# Patient Record
Sex: Female | Born: 1980
Health system: Southern US, Community
[De-identification: ages and names within clinical notes are randomized; demographics above are authoritative.]

## PROBLEM LIST (undated history)

## (undated) ENCOUNTER — Inpatient Hospital Stay (HOSPITAL_COMMUNITY): Payer: Self-pay

## (undated) DIAGNOSIS — I10 Essential (primary) hypertension: Secondary | ICD-10-CM

## (undated) DIAGNOSIS — D573 Sickle-cell trait: Secondary | ICD-10-CM

## (undated) HISTORY — DX: Sickle-cell trait: D57.3

---

## 2012-09-14 NOTE — L&D Delivery Note (Signed)
I was present for the entire delivery and agree with resident's note and plan of care. Pt was a difficult repair 2/2 inability to stay in same position from pain. Repaired in best fashion possible with patient minimal cooperation. Tawana Scale, MD OB Fellow 06/05/2013 12:55 AM

## 2012-09-14 NOTE — L&D Delivery Note (Signed)
Delivery Note At 8:26 PM a viable female was delivered via  (Presentation: Occiput Anterior).  APGAR: 9, 9; weight TBD.   Placenta status: Intact, Spontaneous.  Cord:  3v with the following complications: 2nd deg laceration.  Cord pH: not sent  Anesthesia:  none Episiotomy: none Lacerations: 2nd degree midline Suture Repair: 2.0 vicryl Est. Blood Loss (mL):  Mom to postpartum.  Baby to nursery-stable.  Holly Oconnor 06/04/2013, 8:53 PM

## 2012-12-20 ENCOUNTER — Other Ambulatory Visit: Payer: Self-pay | Admitting: Obstetrics & Gynecology

## 2012-12-20 ENCOUNTER — Other Ambulatory Visit: Payer: Self-pay

## 2012-12-20 DIAGNOSIS — Z3201 Encounter for pregnancy test, result positive: Secondary | ICD-10-CM

## 2012-12-20 LAB — HIV ANTIBODY (ROUTINE TESTING W REFLEX): HIV: NONREACTIVE

## 2012-12-21 LAB — OBSTETRIC PANEL
Basophils Absolute: 0 10*3/uL (ref 0.0–0.1)
Basophils Relative: 0 % (ref 0–1)
Eosinophils Absolute: 0.1 10*3/uL (ref 0.0–0.7)
Eosinophils Relative: 2 % (ref 0–5)
Lymphocytes Relative: 18 % (ref 12–46)
MCHC: 34.4 g/dL (ref 30.0–36.0)
MCV: 84.5 fL (ref 78.0–100.0)
Platelets: 177 10*3/uL (ref 150–400)
RDW: 15 % (ref 11.5–15.5)
WBC: 6.7 10*3/uL (ref 4.0–10.5)

## 2012-12-22 ENCOUNTER — Encounter: Payer: Self-pay | Admitting: Obstetrics & Gynecology

## 2012-12-22 DIAGNOSIS — D573 Sickle-cell trait: Secondary | ICD-10-CM | POA: Insufficient documentation

## 2012-12-22 HISTORY — DX: Sickle-cell trait: D57.3

## 2012-12-22 LAB — HEMOGLOBINOPATHY EVALUATION
Hgb A2 Quant: 3.5 % — ABNORMAL HIGH (ref 2.2–3.2)
Hgb A: 58.2 % — ABNORMAL LOW (ref 96.8–97.8)
Hgb F Quant: 0.5 % (ref 0.0–2.0)
Hgb S Quant: 37.8 % — ABNORMAL HIGH

## 2012-12-23 ENCOUNTER — Other Ambulatory Visit: Payer: Self-pay | Admitting: Obstetrics & Gynecology

## 2012-12-23 ENCOUNTER — Ambulatory Visit (HOSPITAL_COMMUNITY)
Admission: RE | Admit: 2012-12-23 | Discharge: 2012-12-23 | Disposition: A | Discharge status: Home / Self Care | Payer: Medicaid Other | Source: Ambulatory Visit | Attending: Obstetrics & Gynecology | Admitting: Obstetrics & Gynecology

## 2012-12-23 DIAGNOSIS — Z3201 Encounter for pregnancy test, result positive: Secondary | ICD-10-CM

## 2012-12-23 DIAGNOSIS — O0930 Supervision of pregnancy with insufficient antenatal care, unspecified trimester: Secondary | ICD-10-CM | POA: Insufficient documentation

## 2012-12-23 DIAGNOSIS — Z3689 Encounter for other specified antenatal screening: Secondary | ICD-10-CM | POA: Insufficient documentation

## 2012-12-26 ENCOUNTER — Telehealth: Payer: Self-pay

## 2012-12-26 DIAGNOSIS — Z3689 Encounter for other specified antenatal screening: Secondary | ICD-10-CM

## 2012-12-26 NOTE — Telephone Encounter (Signed)
[place] scheduled for April 28th @ 2pm.  Called pt and left message that she has an [place] appt scheduled and gave her the above time for anatomy and maybe get the sex of baby.  If she has any questions to please give the clinics a call.

## 2012-12-26 NOTE — Telephone Encounter (Signed)
Message copied by Faythe Casa on Mon Dec 26, 2012  3:03 PM ------      Message from: Holly Oconnor A      Created: Mon Dec 26, 2012  8:10 AM       Needs complete anatomy scan in 2-3 weeks ------

## 2013-01-09 ENCOUNTER — Ambulatory Visit (HOSPITAL_COMMUNITY)
Admission: RE | Admit: 2013-01-09 | Discharge: 2013-01-09 | Disposition: A | Discharge status: Home / Self Care | Payer: Medicaid Other | Source: Ambulatory Visit | Attending: Obstetrics & Gynecology | Admitting: Obstetrics & Gynecology

## 2013-01-09 DIAGNOSIS — O0930 Supervision of pregnancy with insufficient antenatal care, unspecified trimester: Secondary | ICD-10-CM | POA: Insufficient documentation

## 2013-01-09 DIAGNOSIS — Z3689 Encounter for other specified antenatal screening: Secondary | ICD-10-CM

## 2013-01-10 ENCOUNTER — Encounter: Payer: Self-pay | Admitting: Obstetrics & Gynecology

## 2013-01-10 DIAGNOSIS — Z3490 Encounter for supervision of normal pregnancy, unspecified, unspecified trimester: Secondary | ICD-10-CM | POA: Insufficient documentation

## 2013-01-18 ENCOUNTER — Other Ambulatory Visit (HOSPITAL_COMMUNITY)
Admission: RE | Admit: 2013-01-18 | Discharge: 2013-01-18 | Disposition: A | Discharge status: Home / Self Care | Payer: Medicaid Other | Source: Ambulatory Visit | Attending: Obstetrics and Gynecology | Admitting: Obstetrics and Gynecology

## 2013-01-18 ENCOUNTER — Ambulatory Visit (INDEPENDENT_AMBULATORY_CARE_PROVIDER_SITE_OTHER): Payer: Medicaid Other | Admitting: Obstetrics and Gynecology

## 2013-01-18 ENCOUNTER — Encounter: Payer: Self-pay | Admitting: Obstetrics and Gynecology

## 2013-01-18 ENCOUNTER — Other Ambulatory Visit: Payer: Self-pay | Admitting: Obstetrics and Gynecology

## 2013-01-18 VITALS — BP 115/64 | Temp 97.2°F | Ht 62.0 in | Wt 130.6 lb

## 2013-01-18 DIAGNOSIS — O2342 Unspecified infection of urinary tract in pregnancy, second trimester: Secondary | ICD-10-CM

## 2013-01-18 DIAGNOSIS — O2390 Unspecified genitourinary tract infection in pregnancy, unspecified trimester: Secondary | ICD-10-CM

## 2013-01-18 DIAGNOSIS — Z1151 Encounter for screening for human papillomavirus (HPV): Secondary | ICD-10-CM | POA: Insufficient documentation

## 2013-01-18 DIAGNOSIS — D573 Sickle-cell trait: Secondary | ICD-10-CM

## 2013-01-18 DIAGNOSIS — Z01419 Encounter for gynecological examination (general) (routine) without abnormal findings: Secondary | ICD-10-CM | POA: Insufficient documentation

## 2013-01-18 DIAGNOSIS — Z113 Encounter for screening for infections with a predominantly sexual mode of transmission: Secondary | ICD-10-CM | POA: Insufficient documentation

## 2013-01-18 LAB — POCT URINALYSIS DIP (DEVICE)
Glucose, UA: NEGATIVE mg/dL
Leukocytes, UA: NEGATIVE
Nitrite: NEGATIVE
Protein, ur: NEGATIVE mg/dL
Urobilinogen, UA: 1 mg/dL (ref 0.0–1.0)

## 2013-01-18 LAB — OB RESULTS CONSOLE GC/CHLAMYDIA: Gonorrhea: NEGATIVE

## 2013-01-18 NOTE — Patient Instructions (Addendum)
Pregnancy - Second Trimester The second trimester of pregnancy (3 to 6 months) is a period of rapid growth for you and your baby. At the end of the sixth month, your baby is about 9 inches long and weighs 1 1/2 pounds. You will begin to feel the baby move between 18 and 20 weeks of the pregnancy. This is called quickening. Weight gain is faster. A clear fluid (colostrum) may leak out of your breasts. You may feel small contractions of the womb (uterus). This is known as false labor or Braxton-Hicks contractions. This is like a practice for labor when the baby is ready to be born. Usually, the problems with morning sickness have usually passed by the end of your first trimester. Some women develop small dark blotches (called cholasma, mask of pregnancy) on their face that usually goes away after the baby is born. Exposure to the sun makes the blotches worse. Acne may also develop in some pregnant women and pregnant women who have acne, may find that it goes away. PRENATAL EXAMS  Blood work may continue to be done during prenatal exams. These tests are done to check on your health and the probable health of your baby. Blood work is used to follow your blood levels (hemoglobin). Anemia (low hemoglobin) is common during pregnancy. Iron and vitamins are given to help prevent this. You will also be checked for diabetes between 24 and 28 weeks of the pregnancy. Some of the previous blood tests may be repeated.  The size of the uterus is measured during each visit. This is to make sure that the baby is continuing to grow properly according to the dates of the pregnancy.  Your blood pressure is checked every prenatal visit. This is to make sure you are not getting toxemia.  Your urine is checked to make sure you do not have an infection, diabetes or protein in the urine.  Your weight is checked often to make sure gains are happening at the suggested rate. This is to ensure that both you and your baby are growing  normally.  Sometimes, an ultrasound is performed to confirm the proper growth and development of the baby. This is a test which bounces harmless sound waves off the baby so your caregiver can more accurately determine due dates. Sometimes, a specialized test is done on the amniotic fluid surrounding the baby. This test is called an amniocentesis. The amniotic fluid is obtained by sticking a needle into the belly (abdomen). This is done to check the chromosomes in instances where there is a concern about possible genetic problems with the baby. It is also sometimes done near the end of pregnancy if an early delivery is required. In this case, it is done to help make sure the baby's lungs are mature enough for the baby to live outside of the womb. CHANGES OCCURING IN THE SECOND TRIMESTER OF PREGNANCY Your body goes through many changes during pregnancy. They vary from person to person. Talk to your caregiver about changes you notice that you are concerned about.  During the second trimester, you will likely have an increase in your appetite. It is normal to have cravings for certain foods. This varies from person to person and pregnancy to pregnancy.  Your lower abdomen will begin to bulge.  You may have to urinate more often because the uterus and baby are pressing on your bladder. It is also common to get more bladder infections during pregnancy (pain with urination). You can help this by   drinking lots of fluids and emptying your bladder before and after intercourse.  You may begin to get stretch marks on your hips, abdomen, and breasts. These are normal changes in the body during pregnancy. There are no exercises or medications to take that prevent this change.  You may begin to develop swollen and bulging veins (varicose veins) in your legs. Wearing support hose, elevating your feet for 15 minutes, 3 to 4 times a day and limiting salt in your diet helps lessen the problem.  Heartburn may develop  as the uterus grows and pushes up against the stomach. Antacids recommended by your caregiver helps with this problem. Also, eating smaller meals 4 to 5 times a day helps.  Constipation can be treated with a stool softener or adding bulk to your diet. Drinking lots of fluids, vegetables, fruits, and whole grains are helpful.  Exercising is also helpful. If you have been very active up until your pregnancy, most of these activities can be continued during your pregnancy. If you have been less active, it is helpful to start an exercise program such as walking.  Hemorrhoids (varicose veins in the rectum) may develop at the end of the second trimester. Warm sitz baths and hemorrhoid cream recommended by your caregiver helps hemorrhoid problems.  Backaches may develop during this time of your pregnancy. Avoid heavy lifting, wear low heal shoes and practice good posture to help with backache problems.  Some pregnant women develop tingling and numbness of their hand and fingers because of swelling and tightening of ligaments in the wrist (carpel tunnel syndrome). This goes away after the baby is born.  As your breasts enlarge, you may have to get a bigger bra. Get a comfortable, cotton, support bra. Do not get a nursing bra until the last month of the pregnancy if you will be nursing the baby.  You may get a dark line from your belly button to the pubic area called the linea nigra.  You may develop rosy cheeks because of increase blood flow to the face.  You may develop spider looking lines of the face, neck, arms and chest. These go away after the baby is born. HOME CARE INSTRUCTIONS   It is extremely important to avoid all smoking, herbs, alcohol, and unprescribed drugs during your pregnancy. These chemicals affect the formation and growth of the baby. Avoid these chemicals throughout the pregnancy to ensure the delivery of a healthy infant.  Most of your home care instructions are the same as  suggested for the first trimester of your pregnancy. Keep your caregiver's appointments. Follow your caregiver's instructions regarding medication use, exercise and diet.  During pregnancy, you are providing food for you and your baby. Continue to eat regular, well-balanced meals. Choose foods such as meat, fish, milk and other low fat dairy products, vegetables, fruits, and whole-grain breads and cereals. Your caregiver will tell you of the ideal weight gain.  A physical sexual relationship may be continued up until near the end of pregnancy if there are no other problems. Problems could include early (premature) leaking of amniotic fluid from the membranes, vaginal bleeding, abdominal pain, or other medical or pregnancy problems.  Exercise regularly if there are no restrictions. Check with your caregiver if you are unsure of the safety of some of your exercises. The greatest weight gain will occur in the last 2 trimesters of pregnancy. Exercise will help you:  Control your weight.  Get you in shape for labor and delivery.  Lose weight   after you have the baby.  Wear a good support or jogging bra for breast tenderness during pregnancy. This may help if worn during sleep. Pads or tissues may be used in the bra if you are leaking colostrum.  Do not use hot tubs, steam rooms or saunas throughout the pregnancy.  Wear your seat belt at all times when driving. This protects you and your baby if you are in an accident.  Avoid raw meat, uncooked cheese, cat litter boxes and soil used by cats. These carry germs that can cause birth defects in the baby.  The second trimester is also a good time to visit your dentist for your dental health if this has not been done yet. Getting your teeth cleaned is OK. Use a soft toothbrush. Brush gently during pregnancy.  It is easier to loose urine during pregnancy. Tightening up and strengthening the pelvic muscles will help with this problem. Practice stopping your  urination while you are going to the bathroom. These are the same muscles you need to strengthen. It is also the muscles you would use as if you were trying to stop from passing gas. You can practice tightening these muscles up 10 times a set and repeating this about 3 times per day. Once you know what muscles to tighten up, do not perform these exercises during urination. It is more likely to contribute to an infection by backing up the urine.  Ask for help if you have financial, counseling or nutritional needs during pregnancy. Your caregiver will be able to offer counseling for these needs as well as refer you for other special needs.  Your skin may become oily. If so, wash your face with mild soap, use non-greasy moisturizer and oil or cream based makeup. MEDICATIONS AND DRUG USE IN PREGNANCY  Take prenatal vitamins as directed. The vitamin should contain 1 milligram of folic acid. Keep all vitamins out of reach of children. Only a couple vitamins or tablets containing iron may be fatal to a baby or young child when ingested.  Avoid use of all medications, including herbs, over-the-counter medications, not prescribed or suggested by your caregiver. Only take over-the-counter or prescription medicines for pain, discomfort, or fever as directed by your caregiver. Do not use aspirin.  Let your caregiver also know about herbs you may be using.  Alcohol is related to a number of birth defects. This includes fetal alcohol syndrome. All alcohol, in any form, should be avoided completely. Smoking will cause low birth rate and premature babies.  Street or illegal drugs are very harmful to the baby. They are absolutely forbidden. A baby born to an addicted mother will be addicted at birth. The baby will go through the same withdrawal an adult does. SEEK MEDICAL CARE IF:  You have any concerns or worries during your pregnancy. It is better to call with your questions if you feel they cannot wait, rather  than worry about them. SEEK IMMEDIATE MEDICAL CARE IF:   An unexplained oral temperature above 102 F (38.9 C) develops, or as your caregiver suggests.  You have leaking of fluid from the vagina (birth canal). If leaking membranes are suspected, take your temperature and tell your caregiver of this when you call.  There is vaginal spotting, bleeding, or passing clots. Tell your caregiver of the amount and how many pads are used. Light spotting in pregnancy is common, especially following intercourse.  You develop a bad smelling vaginal discharge with a change in the color from clear   to white.  You continue to feel sick to your stomach (nauseated) and have no relief from remedies suggested. You vomit blood or coffee ground-like materials.  You lose more than 2 pounds of weight or gain more than 2 pounds of weight over 1 week, or as suggested by your caregiver.  You notice swelling of your face, hands, feet, or legs.  You get exposed to German measles and have never had them.  You are exposed to fifth disease or chickenpox.  You develop belly (abdominal) pain. Round ligament discomfort is a common non-cancerous (benign) cause of abdominal pain in pregnancy. Your caregiver still must evaluate you.  You develop a bad headache that does not go away.  You develop fever, diarrhea, pain with urination, or shortness of breath.  You develop visual problems, blurry, or double vision.  You fall or are in a car accident or any kind of trauma.  There is mental or physical violence at home. Document Released: 08/25/2001 Document Revised: 11/23/2011 Document Reviewed: 02/27/2009 Exit[employer] Patient Information 2013 [employer].  

## 2013-01-18 NOTE — Progress Notes (Signed)
Pulse-82 Patient reports pain in her sides (upper) after eating or if she eats too late

## 2013-01-18 NOTE — Progress Notes (Signed)
NOB, Pap,GC/CT done.

## 2013-01-18 NOTE — Progress Notes (Signed)
   Subjective:    Holly Oconnor is a G2P1001 [redacted]w[redacted]d being seen today for her first obstetrical visit.  Her obstetrical history is significant for Hgb AS. FOB unknown and declines testing. . Patient does intend to breast feed. Pregnancy history fully reviewed.  Patient reports mild reflux sx. Ceasar Mons Vitals:   01/18/13 0827 01/18/13 0828  BP: 115/64   Temp: 97.2 F (36.2 C)   Height:  5\' 2"  (1.575 m)  Weight: 130 lb 9.6 oz (59.24 kg)     HISTORY: OB History   Grav Para Term Preterm Abortions TAB SAB Ect Mult Living   2 1 1  0 0 0 0 0 0 1     # Outc Date GA Lbr Len/2nd Wgt Sex Del Anes PTL Lv   1 TRM 1/12 [redacted]w[redacted]d  7lb(3.175kg) F SVD EPI  Yes   2 CUR              Past Medical History  Diagnosis Date  . AS (sickle cell trait) 12/22/2012   History reviewed. No pertinent past surgical history. History reviewed. No pertinent family history.   Exam    Uterus:     Pelvic Exam:    Perineum: Normal Perineum   Vulva: normal, Bartholin's, Urethra, Skene's normal   Vagina:  normal mucosa, normal discharge       Cervix: no bleeding following Pap, no cervical motion tenderness and no lesions        Bony Pelvis: average  System: Breast:  normal appearance, no masses or tenderness   Skin: normal coloration and turgor, no rashes    Neurologic: oriented, normal, grossly non-focal   Extremities: normal strength, tone, and muscle mass   HEENT PERRLA   Mouth/Teeth mucous membranes moist, pharynx normal without lesions   Neck supple, no masses and thyroid not enlarged   Cardiovascular: regular rate and rhythm, no murmurs or gallops   Respiratory:  appears well, vitals normal, no respiratory distress, acyanotic, normal RR, ear and throat exam is normal, neck free of mass or lymphadenopathy, chest clear, no wheezing, crepitations, rhonchi, normal symmetric air entry   Abdomen: soft, NT FH 19   Urinary: urethral meatus normal      Assessment:    Pregnancy: G2P1001 Patient Active  Problem List   Diagnosis Date Noted  . Supervision of normal pregnancy 01/10/2013  . AS (sickle cell trait) 12/22/2012        Plan:     Initial labs drawn. Prenatal vitamins. Problem list reviewed and updated. Genetic Screening discussed Quad Screen: too late.  Ultrasound discussed; fetal survey: anatomic scan was all WNL.  Follow up in 4 weeks. 50% of 30 min visit spent on counseling and coordination of care.  Explained 1:4 chance SS dz in baby if FOB has trait but he declines to get tested   [facility] [place] 01/18/2013

## 2013-01-18 NOTE — Addendum Note (Signed)
Addended by: Faythe Casa on: 01/18/2013 12:20 PM   Modules accepted: Orders

## 2013-01-20 ENCOUNTER — Telehealth: Payer: Self-pay | Admitting: *Deleted

## 2013-01-20 ENCOUNTER — Other Ambulatory Visit: Payer: Self-pay | Admitting: Obstetrics & Gynecology

## 2013-01-20 DIAGNOSIS — O2340 Unspecified infection of urinary tract in pregnancy, unspecified trimester: Secondary | ICD-10-CM | POA: Insufficient documentation

## 2013-01-20 LAB — CULTURE, OB URINE: Colony Count: >=100000

## 2013-01-20 MED ORDER — AMOXICILLIN 500 MG PO CAPS: 500.0000 mg | ORAL_CAPSULE | Freq: Three times a day (TID) | ORAL | Status: DC

## 2013-01-20 NOTE — Progress Notes (Signed)
Pt called and no antibiotic is at pharmacy.  Pt has enterococcus uti.  No sensitivities are back.  Ill start amoxicillin 500 mg po tid for 1 week.  Sent to [facility]

## 2013-01-20 NOTE — Telephone Encounter (Addendum)
Message copied by Jill Side on Fri Jan 20, 2013 10:58 AM ------      Message from: POE, DEIRDRE C      Created: Fri Jan 20, 2013  9:19 AM       Treat keflex 500 bid x 7d ------Called pt and left message that she has a bladder infection and prescription for antibiotic has been sent to her pharmacy. Please pick up medicine and take according to directions. She may call back on Monday 5/12 if has has any questions.

## 2013-02-15 ENCOUNTER — Ambulatory Visit (INDEPENDENT_AMBULATORY_CARE_PROVIDER_SITE_OTHER): Payer: Medicaid Other | Admitting: Advanced Practice Midwife

## 2013-02-15 VITALS — BP 112/64 | Temp 98.1°F | Wt 136.2 lb

## 2013-02-15 DIAGNOSIS — Z3492 Encounter for supervision of normal pregnancy, unspecified, second trimester: Secondary | ICD-10-CM

## 2013-02-15 DIAGNOSIS — O2390 Unspecified genitourinary tract infection in pregnancy, unspecified trimester: Secondary | ICD-10-CM

## 2013-02-15 DIAGNOSIS — D573 Sickle-cell trait: Secondary | ICD-10-CM

## 2013-02-15 LAB — POCT URINALYSIS DIP (DEVICE)
Bilirubin Urine: NEGATIVE
Ketones, ur: NEGATIVE mg/dL
pH: 7 (ref 5.0–8.0)

## 2013-02-15 MED ORDER — PRENATAL 27-0.8 MG PO TABS: 1.0000 | ORAL_TABLET | Freq: Every day | ORAL | Status: DC

## 2013-02-15 NOTE — Patient Instructions (Signed)
May use over the counter Lotrimin for the rash on your chest.

## 2013-02-15 NOTE — Progress Notes (Signed)
No complaints feeling well. Has had anatomy ultrasound. GTT at next visit. Has tinea rash on chest. Will use lotrimin cream.

## 2013-02-15 NOTE — Progress Notes (Signed)
P=92,   

## 2013-03-15 ENCOUNTER — Encounter: Payer: Self-pay | Admitting: Advanced Practice Midwife

## 2013-03-15 ENCOUNTER — Other Ambulatory Visit: Payer: Self-pay | Admitting: Advanced Practice Midwife

## 2013-03-15 ENCOUNTER — Ambulatory Visit (INDEPENDENT_AMBULATORY_CARE_PROVIDER_SITE_OTHER): Payer: Medicaid Other | Admitting: Advanced Practice Midwife

## 2013-03-15 VITALS — BP 139/69 | Temp 97.3°F | Wt 140.3 lb

## 2013-03-15 DIAGNOSIS — O99019 Anemia complicating pregnancy, unspecified trimester: Secondary | ICD-10-CM | POA: Insufficient documentation

## 2013-03-15 DIAGNOSIS — O99013 Anemia complicating pregnancy, third trimester: Secondary | ICD-10-CM

## 2013-03-15 DIAGNOSIS — O2390 Unspecified genitourinary tract infection in pregnancy, unspecified trimester: Secondary | ICD-10-CM

## 2013-03-15 DIAGNOSIS — D573 Sickle-cell trait: Secondary | ICD-10-CM

## 2013-03-15 LAB — POCT URINALYSIS DIP (DEVICE)
Bilirubin Urine: NEGATIVE
Glucose, UA: NEGATIVE mg/dL
Leukocytes, UA: NEGATIVE
Nitrite: NEGATIVE

## 2013-03-15 LAB — CBC
Platelets: 131 10*3/uL — ABNORMAL LOW (ref 150–400)
RDW: 14.6 % (ref 11.5–15.5)
WBC: 5.9 10*3/uL (ref 4.0–10.5)

## 2013-03-15 MED ORDER — FERROUS SULFATE 325 (65 FE) MG PO TABS: 325.0000 mg | ORAL_TABLET | Freq: Two times a day (BID) | ORAL | Status: DC

## 2013-03-15 NOTE — Progress Notes (Signed)
Pulse- 82  Edema-feet

## 2013-03-15 NOTE — Progress Notes (Signed)
Well, 1 hour GCT today, rev'd PTL and FKC.

## 2013-03-15 NOTE — Progress Notes (Signed)
No c/o, rev'd precautions, 1 hour gct today.

## 2013-03-15 NOTE — Patient Instructions (Signed)

## 2013-03-16 LAB — RPR

## 2013-03-16 NOTE — Progress Notes (Signed)
Called patient and informed her of results and Rx and need to increased iron rich foods in her diet. Patient verbalized understanding and had no further questions

## 2013-03-29 ENCOUNTER — Encounter: Payer: Self-pay | Admitting: Advanced Practice Midwife

## 2013-03-29 ENCOUNTER — Ambulatory Visit (INDEPENDENT_AMBULATORY_CARE_PROVIDER_SITE_OTHER): Payer: Medicaid Other | Admitting: Advanced Practice Midwife

## 2013-03-29 VITALS — BP 106/64 | Temp 97.0°F | Wt 140.0 lb

## 2013-03-29 DIAGNOSIS — M8560 Other cyst of bone, unspecified site: Secondary | ICD-10-CM

## 2013-03-29 DIAGNOSIS — O2390 Unspecified genitourinary tract infection in pregnancy, unspecified trimester: Secondary | ICD-10-CM

## 2013-03-29 DIAGNOSIS — M85671 Other cyst of bone, right ankle and foot: Secondary | ICD-10-CM

## 2013-03-29 DIAGNOSIS — Z3493 Encounter for supervision of normal pregnancy, unspecified, third trimester: Secondary | ICD-10-CM

## 2013-03-29 LAB — POCT URINALYSIS DIP (DEVICE)
Glucose, UA: NEGATIVE mg/dL
Hgb urine dipstick: NEGATIVE
Nitrite: NEGATIVE
Specific Gravity, Urine: 1.02 (ref 1.005–1.030)
Urobilinogen, UA: 0.2 mg/dL (ref 0.0–1.0)
pH: 7 (ref 5.0–8.0)

## 2013-03-29 NOTE — Progress Notes (Signed)
Feels well. Has a small cyst on right foot that has been there several months. Feels cystic in nature, grape sized, nonerethematous. Tender to touch. Will observe

## 2013-03-29 NOTE — Progress Notes (Signed)
Pulse: 75

## 2013-03-29 NOTE — Patient Instructions (Addendum)

## 2013-04-12 ENCOUNTER — Encounter: Payer: Self-pay | Admitting: Family Medicine

## 2013-04-12 ENCOUNTER — Ambulatory Visit (INDEPENDENT_AMBULATORY_CARE_PROVIDER_SITE_OTHER): Payer: Medicaid Other | Admitting: Family Medicine

## 2013-04-12 VITALS — BP 108/65 | Temp 97.6°F | Wt 138.9 lb

## 2013-04-12 DIAGNOSIS — O99013 Anemia complicating pregnancy, third trimester: Secondary | ICD-10-CM

## 2013-04-12 DIAGNOSIS — Z3490 Encounter for supervision of normal pregnancy, unspecified, unspecified trimester: Secondary | ICD-10-CM

## 2013-04-12 DIAGNOSIS — O99019 Anemia complicating pregnancy, unspecified trimester: Secondary | ICD-10-CM

## 2013-04-12 DIAGNOSIS — Z9109 Other allergy status, other than to drugs and biological substances: Secondary | ICD-10-CM | POA: Insufficient documentation

## 2013-04-12 LAB — POCT URINALYSIS DIP (DEVICE)
Bilirubin Urine: NEGATIVE
Hgb urine dipstick: NEGATIVE
Ketones, ur: NEGATIVE mg/dL
Nitrite: NEGATIVE
Protein, ur: NEGATIVE mg/dL
pH: 8.5 — ABNORMAL HIGH (ref 5.0–8.0)

## 2013-04-12 MED ORDER — RANITIDINE HCL 150 MG/10ML PO SYRP: 150.0000 mg | ORAL_SOLUTION | Freq: Two times a day (BID) | ORAL | Status: DC

## 2013-04-12 NOTE — Progress Notes (Signed)
Pulse: 90

## 2013-04-12 NOTE — Progress Notes (Signed)
Pt is here for [redacted]w[redacted]d visit. G2P1001 Doing well. Feels some pressure but no ctx. Has some mucus in throat and burning at times. No vb, no LOF. +FM  O: see flowsheet.  HEENT: cobblestoning of post pharynx  A/p  1) mucus - likely due to gerd -trial of ranitidine  2) TDAP given today Labor precautions discussed F/u in 2 weeks.

## 2013-04-12 NOTE — Patient Instructions (Signed)
1) you may take tylenol for mild pain as needed 2) try to use some vaseline on your dry nipples at least twice a week 3) for the mucus - I sent an RX for ranitidine to try

## 2013-04-26 ENCOUNTER — Ambulatory Visit (INDEPENDENT_AMBULATORY_CARE_PROVIDER_SITE_OTHER): Payer: Medicaid Other | Admitting: Advanced Practice Midwife

## 2013-04-26 VITALS — BP 112/60 | Wt 140.3 lb

## 2013-04-26 DIAGNOSIS — O99019 Anemia complicating pregnancy, unspecified trimester: Secondary | ICD-10-CM

## 2013-04-26 LAB — POCT URINALYSIS DIP (DEVICE)
Ketones, ur: NEGATIVE mg/dL
Protein, ur: NEGATIVE mg/dL
Specific Gravity, Urine: 1.02 (ref 1.005–1.030)
Urobilinogen, UA: 0.2 mg/dL (ref 0.0–1.0)
pH: 7 (ref 5.0–8.0)

## 2013-04-26 NOTE — Progress Notes (Signed)
Doing well.  Good fetal movement, denies vaginal bleeding, LOF, regular contractions.  Reports some left shoulder pain when she wakes up in the morning.  Recommend Tylenol, rest, ice, heat.

## 2013-04-26 NOTE — Progress Notes (Signed)
Pulse: 92

## 2013-05-10 ENCOUNTER — Ambulatory Visit (INDEPENDENT_AMBULATORY_CARE_PROVIDER_SITE_OTHER): Payer: Medicaid Other | Admitting: Advanced Practice Midwife

## 2013-05-10 VITALS — BP 129/69 | Temp 97.8°F | Wt 140.2 lb

## 2013-05-10 DIAGNOSIS — O2390 Unspecified genitourinary tract infection in pregnancy, unspecified trimester: Secondary | ICD-10-CM

## 2013-05-10 DIAGNOSIS — D573 Sickle-cell trait: Secondary | ICD-10-CM

## 2013-05-10 DIAGNOSIS — O99019 Anemia complicating pregnancy, unspecified trimester: Secondary | ICD-10-CM

## 2013-05-10 LAB — POCT URINALYSIS DIP (DEVICE)
Hgb urine dipstick: NEGATIVE
Nitrite: NEGATIVE
Protein, ur: NEGATIVE mg/dL
Specific Gravity, Urine: 1.015 (ref 1.005–1.030)
Urobilinogen, UA: 1 mg/dL (ref 0.0–1.0)

## 2013-05-10 LAB — OB RESULTS CONSOLE GC/CHLAMYDIA
Chlamydia: NEGATIVE
Gonorrhea: NEGATIVE

## 2013-05-10 NOTE — Progress Notes (Signed)
GBS and cultures done. Exam deferred. Doing well.

## 2013-05-10 NOTE — Progress Notes (Signed)
Pulse- 94  Edema-legs  Pain/pressure-vaginal

## 2013-05-10 NOTE — Patient Instructions (Signed)

## 2013-05-12 LAB — OB RESULTS CONSOLE GBS: GBS: POSITIVE

## 2013-05-13 ENCOUNTER — Encounter: Payer: Self-pay | Admitting: Advanced Practice Midwife

## 2013-05-13 DIAGNOSIS — A491 Streptococcal infection, unspecified site: Secondary | ICD-10-CM | POA: Insufficient documentation

## 2013-05-15 ENCOUNTER — Encounter: Payer: Self-pay | Admitting: Advanced Practice Midwife

## 2013-05-15 DIAGNOSIS — O98819 Other maternal infectious and parasitic diseases complicating pregnancy, unspecified trimester: Secondary | ICD-10-CM

## 2013-05-15 DIAGNOSIS — B951 Streptococcus, group B, as the cause of diseases classified elsewhere: Secondary | ICD-10-CM | POA: Insufficient documentation

## 2013-05-17 ENCOUNTER — Ambulatory Visit (INDEPENDENT_AMBULATORY_CARE_PROVIDER_SITE_OTHER): Payer: Medicaid Other | Admitting: Family Medicine

## 2013-05-17 VITALS — BP 121/74 | Temp 98.5°F | Wt 140.1 lb

## 2013-05-17 DIAGNOSIS — O98819 Other maternal infectious and parasitic diseases complicating pregnancy, unspecified trimester: Secondary | ICD-10-CM

## 2013-05-17 DIAGNOSIS — A491 Streptococcal infection, unspecified site: Secondary | ICD-10-CM

## 2013-05-17 DIAGNOSIS — Z23 Encounter for immunization: Secondary | ICD-10-CM

## 2013-05-17 DIAGNOSIS — B951 Streptococcus, group B, as the cause of diseases classified elsewhere: Secondary | ICD-10-CM

## 2013-05-17 DIAGNOSIS — O2390 Unspecified genitourinary tract infection in pregnancy, unspecified trimester: Secondary | ICD-10-CM

## 2013-05-17 DIAGNOSIS — O99019 Anemia complicating pregnancy, unspecified trimester: Secondary | ICD-10-CM

## 2013-05-17 DIAGNOSIS — Z3493 Encounter for supervision of normal pregnancy, unspecified, third trimester: Secondary | ICD-10-CM

## 2013-05-17 LAB — POCT URINALYSIS DIP (DEVICE)
Bilirubin Urine: NEGATIVE
Glucose, UA: NEGATIVE mg/dL
Ketones, ur: NEGATIVE mg/dL
Nitrite: NEGATIVE

## 2013-05-17 MED ORDER — TETANUS-DIPHTH-ACELL PERTUSSIS 5-2.5-18.5 LF-MCG/0.5 IM SUSP
0.5000 mL | Freq: Once | INTRAMUSCULAR | Status: AC
  Administered 2013-05-17: 0.5 mL via INTRAMUSCULAR

## 2013-05-17 NOTE — Progress Notes (Signed)
Pt is a 32 yo G2P1001 @ [redacted]w[redacted]d here for ROBV  S: doing well today. No contractions, vb, lof. +FM. Some swelling   O: see flowsheet  A/P: -term pregnancy without complaints.  - sve deferred  - GBS + status discussed with pt -tdap today F/u in 1 week.

## 2013-05-17 NOTE — Patient Instructions (Signed)

## 2013-05-17 NOTE — Progress Notes (Signed)
P = 87 

## 2013-05-24 ENCOUNTER — Ambulatory Visit (INDEPENDENT_AMBULATORY_CARE_PROVIDER_SITE_OTHER): Payer: Medicaid Other | Admitting: Family

## 2013-05-24 VITALS — BP 114/65 | Wt 141.2 lb

## 2013-05-24 DIAGNOSIS — Z3493 Encounter for supervision of normal pregnancy, unspecified, third trimester: Secondary | ICD-10-CM

## 2013-05-24 DIAGNOSIS — O98819 Other maternal infectious and parasitic diseases complicating pregnancy, unspecified trimester: Secondary | ICD-10-CM

## 2013-05-24 LAB — POCT URINALYSIS DIP (DEVICE)
Hgb urine dipstick: NEGATIVE
Ketones, ur: NEGATIVE mg/dL
Protein, ur: NEGATIVE mg/dL
Specific Gravity, Urine: 1.015 (ref 1.005–1.030)
Urobilinogen, UA: 0.2 mg/dL (ref 0.0–1.0)

## 2013-05-24 NOTE — Progress Notes (Signed)
Pulse- 80 

## 2013-05-24 NOTE — Progress Notes (Signed)
No questions or concerns. Reviewed labor precautions.

## 2013-05-31 ENCOUNTER — Encounter: Payer: Self-pay | Admitting: Advanced Practice Midwife

## 2013-05-31 ENCOUNTER — Encounter: Payer: Self-pay | Admitting: *Deleted

## 2013-05-31 ENCOUNTER — Ambulatory Visit (INDEPENDENT_AMBULATORY_CARE_PROVIDER_SITE_OTHER): Payer: Medicaid Other | Admitting: Advanced Practice Midwife

## 2013-05-31 VITALS — BP 117/73 | Temp 97.4°F | Wt 143.0 lb

## 2013-05-31 DIAGNOSIS — O2390 Unspecified genitourinary tract infection in pregnancy, unspecified trimester: Secondary | ICD-10-CM

## 2013-05-31 DIAGNOSIS — O98819 Other maternal infectious and parasitic diseases complicating pregnancy, unspecified trimester: Secondary | ICD-10-CM

## 2013-05-31 DIAGNOSIS — Z3493 Encounter for supervision of normal pregnancy, unspecified, third trimester: Secondary | ICD-10-CM

## 2013-05-31 LAB — POCT URINALYSIS DIP (DEVICE)
Bilirubin Urine: NEGATIVE
Glucose, UA: NEGATIVE mg/dL
Hgb urine dipstick: NEGATIVE
Nitrite: NEGATIVE
Specific Gravity, Urine: 1.02 (ref 1.005–1.030)
Urobilinogen, UA: 0.2 mg/dL (ref 0.0–1.0)
pH: 7 (ref 5.0–8.0)

## 2013-05-31 NOTE — Progress Notes (Signed)
Plan NST next week. Labor signs reviewed

## 2013-05-31 NOTE — Patient Instructions (Signed)
Vaginal Delivery  Your caregiver must first be sure you are in labor. Signs of labor include:   You may pass what is called "the mucus plug" before labor begins. This is a small amount of blood stained mucus.   Regular uterine contractions.   The time between contractions get closer together.   The discomfort and pain gradually gets more intense.   Pains are mostly located in the back.   Pains get worse when walking.   The cervix (the opening of the uterus) becomes thinner (begins to efface) and opens up (dilates).  Once you are in labor and admitted into the hospital or care center, your caregiver will do the following:   A complete physical examination.   Check your vital signs (blood pressure, pulse, temperature and the fetal heart rate).   Do a vaginal examination (using a sterile glove and lubricant) to determine:   The position (presentation) of the baby (head [vertex] or buttock first).   The level (station) of the baby's head in the birth canal.   The effacement and dilatation of the cervix.   You may have your pubic hair shaved and be given an enema depending on your caregiver and the circumstance.   An electronic monitor is usually placed on your abdomen. The monitor follows the length and intensity of the contractions, as well as the baby's heart rate.   Usually, your caregiver will insert an IV in your arm with a bottle of sugar water. This is done as a precaution so that medications can be given to you quickly during labor or delivery.  NORMAL LABOR AND DELIVERY IS DIVIDED UP INTO 3 STAGES:  First Stage  This is when regular contractions begin and the cervix begins to efface and dilate. This stage can last from 3 to 15 hours. The end of the first stage is when the cervix is 100% effaced and 10 centimeters dilated. Pain medications may be given by    Injection (morphine, demerol, etc.)    Regional anesthesia (spinal, caudal or epidural, anesthetics given in different locations of the spine). Paracervical pain medication may be given, which is an injection of and anesthetic on each side of the cervix.  A pregnant woman may request to have "Natural Childbirth" which is not to have any medications or anesthesia during her labor and delivery.  Second Stage  This is when the baby comes down through the birth canal (vagina) and is born. This can take 1 to 4 hours. As the baby's head comes down through the birth canal, you may feel like you are going to have a bowel movement. You will get the urge to bear down and push until the baby is delivered. As the baby's head is being delivered, the caregiver will decide if an episiotomy (a cut in the perineum and vagina area) is needed to prevent tearing of the tissue in this area. The episiotomy is sewn up after the delivery of the baby and placenta. Sometimes a mask with nitrous oxide is given for the mother to breath during the delivery of the baby to help if there is too much pain. The end of Stage 2 is when the baby is fully delivered. Then when the umbilical cord stops pulsating it is clamped and cut.  Third Stage  The third stage begins after the baby is completely delivered and ends after the placenta (afterbirth) is delivered. This usually takes 5 to 30 minutes. After the placenta is delivered, a medication is given   either by intravenous or injection to help contract the uterus and prevent bleeding. The third stage is not painful and pain medication is usually not necessary. If an episiotomy was done, it is repaired at this time.  After the delivery, the mother is watched and monitored closely for 1 to 2 hours to make sure there is no postpartum bleeding (hemorrhage). If there is a lot of bleeding, medication is given to contract the uterus and stop the bleeding.  Document Released: 06/09/2008 Document Revised: 05/25/2012 Document Reviewed: 06/09/2008   [employer] Patient Information 2014 [employer].

## 2013-05-31 NOTE — Progress Notes (Signed)
Pulse  91 Edema trace in feet.

## 2013-06-04 ENCOUNTER — Encounter (HOSPITAL_COMMUNITY): Payer: Self-pay | Admitting: *Deleted

## 2013-06-04 ENCOUNTER — Inpatient Hospital Stay (HOSPITAL_COMMUNITY)
Admission: AD | Admit: 2013-06-04 | Discharge: 2013-06-06 | DRG: 775 | Disposition: A | Discharge status: Home / Self Care | Payer: Medicaid Other | Source: Ambulatory Visit | Attending: Obstetrics & Gynecology | Admitting: Obstetrics & Gynecology

## 2013-06-04 ENCOUNTER — Inpatient Hospital Stay (HOSPITAL_COMMUNITY): Payer: Medicaid Other

## 2013-06-04 DIAGNOSIS — D573 Sickle-cell trait: Secondary | ICD-10-CM

## 2013-06-04 DIAGNOSIS — B951 Streptococcus, group B, as the cause of diseases classified elsewhere: Secondary | ICD-10-CM

## 2013-06-04 DIAGNOSIS — O99892 Other specified diseases and conditions complicating childbirth: Secondary | ICD-10-CM | POA: Diagnosis present

## 2013-06-04 DIAGNOSIS — Z3493 Encounter for supervision of normal pregnancy, unspecified, third trimester: Secondary | ICD-10-CM

## 2013-06-04 DIAGNOSIS — O9989 Other specified diseases and conditions complicating pregnancy, childbirth and the puerperium: Secondary | ICD-10-CM

## 2013-06-04 DIAGNOSIS — O99013 Anemia complicating pregnancy, third trimester: Secondary | ICD-10-CM

## 2013-06-04 DIAGNOSIS — O9902 Anemia complicating childbirth: Secondary | ICD-10-CM

## 2013-06-04 DIAGNOSIS — Z22330 Carrier of Group B streptococcus: Secondary | ICD-10-CM

## 2013-06-04 DIAGNOSIS — A491 Streptococcal infection, unspecified site: Secondary | ICD-10-CM

## 2013-06-04 LAB — CBC
HCT: 31.2 % — ABNORMAL LOW (ref 36.0–46.0)
MCH: 31.1 pg (ref 26.0–34.0)
MCHC: 34.6 g/dL (ref 30.0–36.0)
MCV: 89.9 fL (ref 78.0–100.0)
RDW: 13.8 % (ref 11.5–15.5)

## 2013-06-04 LAB — RPR: RPR Ser Ql: NONREACTIVE

## 2013-06-04 MED ORDER — SENNOSIDES-DOCUSATE SODIUM 8.6-50 MG PO TABS
2.0000 | ORAL_TABLET | ORAL | Status: DC
  Administered 2013-06-05: 2 via ORAL

## 2013-06-04 MED ORDER — DEXTROSE 5 % IV SOLN
5.0000 10*6.[IU] | Freq: Once | INTRAVENOUS | Status: AC
  Administered 2013-06-04: 5 10*6.[IU] via INTRAVENOUS
  Filled 2013-06-04: qty 5

## 2013-06-04 MED ORDER — FENTANYL CITRATE 0.05 MG/ML IJ SOLN
100.0000 ug | Freq: Once | INTRAMUSCULAR | Status: AC
  Administered 2013-06-04: 100 ug via INTRAVENOUS

## 2013-06-04 MED ORDER — IBUPROFEN 600 MG PO TABS: 600.0000 mg | ORAL_TABLET | Freq: Four times a day (QID) | ORAL | Status: DC | PRN

## 2013-06-04 MED ORDER — TERBUTALINE SULFATE 1 MG/ML IJ SOLN: 0.2500 mg | Freq: Once | INTRAMUSCULAR | Status: DC | PRN

## 2013-06-04 MED ORDER — TETANUS-DIPHTH-ACELL PERTUSSIS 5-2.5-18.5 LF-MCG/0.5 IM SUSP
0.5000 mL | Freq: Once | INTRAMUSCULAR | Status: DC
  Filled 2013-06-04: qty 0.5

## 2013-06-04 MED ORDER — DIBUCAINE 1 % RE OINT
1.0000 "application " | TOPICAL_OINTMENT | RECTAL | Status: DC | PRN
  Filled 2013-06-04: qty 28

## 2013-06-04 MED ORDER — SIMETHICONE 80 MG PO CHEW: 80.0000 mg | CHEWABLE_TABLET | ORAL | Status: DC | PRN

## 2013-06-04 MED ORDER — OXYTOCIN 40 UNITS IN LACTATED RINGERS INFUSION - SIMPLE MED
1.0000 m[IU]/min | INTRAVENOUS | Status: DC
  Administered 2013-06-04: 2 m[IU]/min via INTRAVENOUS
  Filled 2013-06-04: qty 1000

## 2013-06-04 MED ORDER — LANOLIN HYDROUS EX OINT: TOPICAL_OINTMENT | CUTANEOUS | Status: DC | PRN

## 2013-06-04 MED ORDER — LACTATED RINGERS IV SOLN: INTRAVENOUS | Status: DC

## 2013-06-04 MED ORDER — ZOLPIDEM TARTRATE 5 MG PO TABS: 5.0000 mg | ORAL_TABLET | Freq: Every evening | ORAL | Status: DC | PRN

## 2013-06-04 MED ORDER — OXYTOCIN BOLUS FROM INFUSION: 500.0000 mL | INTRAVENOUS | Status: DC

## 2013-06-04 MED ORDER — ONDANSETRON HCL 4 MG/2ML IJ SOLN: 4.0000 mg | Freq: Four times a day (QID) | INTRAMUSCULAR | Status: DC | PRN

## 2013-06-04 MED ORDER — FENTANYL CITRATE 0.05 MG/ML IJ SOLN
100.0000 ug | Freq: Once | INTRAMUSCULAR | Status: AC
  Administered 2013-06-04: 100 ug via INTRAVENOUS
  Filled 2013-06-04: qty 2

## 2013-06-04 MED ORDER — FENTANYL CITRATE 0.05 MG/ML IJ SOLN
INTRAMUSCULAR | Status: AC
  Filled 2013-06-04: qty 2

## 2013-06-04 MED ORDER — LIDOCAINE HCL (PF) 1 % IJ SOLN
30.0000 mL | INTRAMUSCULAR | Status: DC | PRN
  Administered 2013-06-04: 30 mL via SUBCUTANEOUS
  Filled 2013-06-04: qty 30

## 2013-06-04 MED ORDER — CITRIC ACID-SODIUM CITRATE 334-500 MG/5ML PO SOLN: 30.0000 mL | ORAL | Status: DC | PRN

## 2013-06-04 MED ORDER — LACTATED RINGERS IV SOLN
500.0000 mL | INTRAVENOUS | Status: DC | PRN
  Administered 2013-06-04: 500 mL via INTRAVENOUS

## 2013-06-04 MED ORDER — IBUPROFEN 600 MG PO TABS
600.0000 mg | ORAL_TABLET | Freq: Four times a day (QID) | ORAL | Status: DC
  Administered 2013-06-05 – 2013-06-06 (×6): 600 mg via ORAL
  Filled 2013-06-04 (×6): qty 1

## 2013-06-04 MED ORDER — ONDANSETRON HCL 4 MG/2ML IJ SOLN: 4.0000 mg | INTRAMUSCULAR | Status: DC | PRN

## 2013-06-04 MED ORDER — OXYCODONE-ACETAMINOPHEN 5-325 MG PO TABS
1.0000 | ORAL_TABLET | ORAL | Status: DC | PRN
  Administered 2013-06-06: 1 via ORAL
  Filled 2013-06-04: qty 1

## 2013-06-04 MED ORDER — ONDANSETRON HCL 4 MG PO TABS: 4.0000 mg | ORAL_TABLET | ORAL | Status: DC | PRN

## 2013-06-04 MED ORDER — BENZOCAINE-MENTHOL 20-0.5 % EX AERO
1.0000 "application " | INHALATION_SPRAY | CUTANEOUS | Status: DC | PRN
  Filled 2013-06-04: qty 56

## 2013-06-04 MED ORDER — PENICILLIN G POTASSIUM 5000000 UNITS IJ SOLR
2.5000 10*6.[IU] | INTRAVENOUS | Status: DC
  Administered 2013-06-04 (×2): 2.5 10*6.[IU] via INTRAVENOUS
  Filled 2013-06-04 (×4): qty 2.5

## 2013-06-04 MED ORDER — ACETAMINOPHEN 325 MG PO TABS: 650.0000 mg | ORAL_TABLET | ORAL | Status: DC | PRN

## 2013-06-04 MED ORDER — PRENATAL MULTIVITAMIN CH
1.0000 | ORAL_TABLET | Freq: Every day | ORAL | Status: DC
  Administered 2013-06-05 – 2013-06-06 (×2): 1 via ORAL
  Filled 2013-06-04 (×2): qty 1

## 2013-06-04 MED ORDER — OXYTOCIN 40 UNITS IN LACTATED RINGERS INFUSION - SIMPLE MED
62.5000 mL/h | INTRAVENOUS | Status: DC
  Administered 2013-06-04: 62.5 mL/h via INTRAVENOUS

## 2013-06-04 MED ORDER — WITCH HAZEL-GLYCERIN EX PADS: 1.0000 "application " | MEDICATED_PAD | CUTANEOUS | Status: DC | PRN

## 2013-06-04 MED ORDER — OXYCODONE-ACETAMINOPHEN 5-325 MG PO TABS: 1.0000 | ORAL_TABLET | ORAL | Status: DC | PRN

## 2013-06-04 MED ORDER — DIPHENHYDRAMINE HCL 25 MG PO CAPS: 25.0000 mg | ORAL_CAPSULE | Freq: Four times a day (QID) | ORAL | Status: DC | PRN

## 2013-06-04 NOTE — Progress Notes (Signed)
Holly Oconnor is a 32 y.o. G2P1001 at [redacted]w[redacted]d admitted for induction of labor due to Non-reactive NST.  Subjective: Not feeling contractions. Social situation: husband at work and can't come to hospital to watch their 31yo who is with mom now until 1400. Waiting to start induction until 1400.  Objective: BP 131/84   Pulse 79   Temp(Src) 98.7 F (37.1 C) (Oral)   Resp 18   Ht 5\' 1"  (1.549 m)   Wt 65.318 kg (144 lb)   BMI 27.22 kg/m2   SpO2 100%      FHT:  FHR: 140 bpm, variability: minimal ,  accelerations:  Abscent,  decelerations:  Absent UC:   none SVE:   Dilation: 3 Effacement (%): 80 Exam by:: Harvin Hazel, RN   Labs: Lab Results  Component Value Date   WBC 7.9 06/04/2013   HGB 10.8* 06/04/2013   HCT 31.2* 06/04/2013   MCV 89.9 06/04/2013   PLT 177 06/04/2013    Assessment / Plan: induction of labor 2/2 non-reassuring NST, on hold until 1400 when husband can be present  Labor: will start pitocin for induction at 1400 Preeclampsia:  no signs or symptoms of toxicity Fetal Wellbeing:  Category II Pain Control:  Labor support without medications I/D:  n/a Anticipated MOD:  NSVD  [name] 06/04/2013, 12:20 PM

## 2013-06-04 NOTE — MAU Note (Addendum)
32 yo, G2P1 at [redacted]w[redacted]d, presenting to MAU with c/o contractions since 0015 today. Reports +FM; denies VB, LOF. Reports uncomplicated vaginal delivery. Denies HSV.

## 2013-06-04 NOTE — Progress Notes (Signed)
Dr. Teodoro Kil  Notified of minimal variablitiy, pt here with 32year old and no support person.  Discussed with pt the need of support person, will call husband.  Will hold pitocin until support person is here.

## 2013-06-04 NOTE — Progress Notes (Signed)
Patient ID: Holly Oconnor, female   DOB: 08-Aug-1981, 32 y.o.   MRN: [id number]  S. Comfortable. O. Vss. FHR- min variability, no decels     CVX- 5/95/-1     AROM- clear  A/P. Augmentation of labor- progressing. Expecatant management

## 2013-06-04 NOTE — MAU Provider Note (Signed)
HPI: Ms. Holly Oconnor is a 32 y.o.female G2P1001 at [redacted]w[redacted]d who presents to MAU for a labor evaluation. I was called by the RN to review the fetal tracing in MAU. She reports good fetal movement, denies LOF, vaginal bleeding, vaginal itching/burning, urinary symptoms, h/a, dizziness, n/v, or fever/chills.    Objective GENERAL: Well-developed, well-nourished female in no acute distress.  HEENT: Normocephalic, atraumatic.   LUNGS: Effort normal HEART: Regular rate  SKIN: Warm, dry and without erythema PSYCH: Normal mood and affect  Filed Vitals:   06/04/13 0723  BP: 127/75  Pulse: 77  Temp: 97.5 F (36.4 C)  TempSrc: Oral  Resp: 18  Height: 5\' 1"  (1.549 m)  Weight: 65.318 kg (144 lb)   Fetal Tracing: Baseline: 120 bpm  Variability: minimal  Accelerations: 15x15 Decelerations: none  Toco: Irregular   Dilation: 2 Effacement (%): 70 Cervical Position: Posterior Presentation: Vertex Exam by:: C.Millican, RN   Dilation: 3 Effacement (%): 80 Cervical Position: Posterior Presentation: Vertex Exam by:: C. Millican, RN   A: Labor  BPP 8/8 in 8 mins per preliminary report   P: Admit to L/D per Dr. Carrington Clamp, NP 06/04/2013 10:34 AM

## 2013-06-04 NOTE — MAU Note (Signed)
Pt presents with complaints of contractions that started yesterday around lunchtime but have gotten worse throughout the night. Denies any bleeding or leakage of fluid.

## 2013-06-04 NOTE — Progress Notes (Signed)
Holly Oconnor is a 32 y.o. G2P1001 at [redacted]w[redacted]d admitted for induction of labor due to Non-reactive NST.  Subjective: Asleep. Hasn't been feeling ctx per RN. Pit at 8mU. Husband was able to take 3yo home.   Objective: BP 111/62   Pulse 80   Temp(Src) 98.7 F (37.1 C) (Oral)   Resp 20   Ht 5\' 1"  (1.549 m)   Wt 65.318 kg (144 lb)   BMI 27.22 kg/m2   SpO2 100%      FHT:  FHR: 130 bpm, variability: moderate,  accelerations:  Present,  decelerations:  Absent UC:   regular, every 1-3 minutes SVE:   Dilation: 3 Effacement (%): 80 Station: -2 Exam by:: [email or web address]  Labs: Lab Results  Component Value Date   WBC 7.9 06/04/2013   HGB 10.8* 06/04/2013   HCT 31.2* 06/04/2013   MCV 89.9 06/04/2013   PLT 177 06/04/2013    Assessment / Plan: Induction of labor due to non-reassuring NST,  progressing well on pitocin  Labor: Progressing normally Preeclampsia:  no signs or symptoms of toxicity Fetal Wellbeing:  Category I Pain Control:  Labor support without medications I/D:  n/a Anticipated MOD:  NSVD  [name] 06/04/2013, 4:15 PM

## 2013-06-04 NOTE — Progress Notes (Signed)
I spoke with and examined patient and agree with resident's note and plan of care.  Tawana Scale, MD OB Fellow 06/04/2013 12:46 PM

## 2013-06-04 NOTE — H&P (Signed)
Holly Oconnor is a 32 y.o. G2P1001 at [redacted]w[redacted]d #Labor: Will augment labor with pitocin #Pain: considering IV and epidural pain control #FWB: Agree with Cat II, with reassurig BPP 8/10 #ID: GBS +, rec'd PCN  I spoke with and examined patient and agree with resident's note and plan of care.  Tawana Scale, MD OB Fellow 06/04/2013 12:48 PM

## 2013-06-04 NOTE — H&P (Signed)
Holly Oconnor is a 32 y.o. female presenting for contractions and labor eval. Maternal Medical History:  Reason for admission: Contractions.   Contractions: Onset was 3-5 hours ago.   Frequency: irregular.   Duration is approximately 20 seconds.   Perceived severity is moderate.    Fetal activity: Perceived fetal activity is normal.   Last perceived fetal movement was within the past hour.    Prenatal complications: No bleeding, HIV, hypertension, pre-eclampsia, preterm labor or substance abuse.   Prenatal Complications - Diabetes: none.    OB History   Grav Para Term Preterm Abortions TAB SAB Ect Mult Living   2 1 1  0 0 0 0 0 0 1     Past Medical History  Diagnosis Date   AS (sickle cell trait) 12/22/2012   No past surgical history on file. Family History: family history is not on file. Social History:  reports that she has never smoked. She has never used smokeless tobacco. She reports that she does not drink alcohol or use illicit drugs.   Prenatal Transfer Tool  Maternal Diabetes: No Genetic Screening: declined Maternal Ultrasounds/Referrals: Normal Fetal Ultrasounds or other Referrals:  None Maternal Substance Abuse:  No Significant Maternal Medications:  None Significant Maternal Lab Results:  Lab values include: Group B Strep positive, Other: sickle cell trait positive Other Comments:  None  Review of Systems  Constitutional: Negative.   HENT: Negative.   Eyes: Negative.   Respiratory: Negative.   Cardiovascular: Negative.   Gastrointestinal: Negative.   Skin: Negative.   All other systems reviewed and are negative.    Dilation: 3 Effacement (%): 80 Exam by:: C. Millican, RN  Blood pressure 127/75, pulse 77, temperature 97.5 F (36.4 C), temperature source Oral, resp. rate 18, height 5\' 1"  (1.549 m), weight 65.318 kg (144 lb). Maternal Exam:  Uterine Assessment: Contraction strength is moderate.  Contraction duration is 20 seconds. Contraction  frequency is irregular.   Abdomen: Patient reports no abdominal tenderness. Fetal presentation: vertex  Introitus: Normal vulva. Normal vagina.  Ferning test: not done.  Nitrazine test: not done. Amniotic fluid character: not assessed.  Pelvis: adequate for delivery.   Cervix: Cervix evaluated by digital exam.     Fetal Exam Fetal Monitor Review: Mode: ultrasound.   Baseline rate: 120.  Variability: minimal (<5 bpm).   Pattern: accelerations present and no decelerations.    Fetal State Assessment: Category II - tracings are indeterminate.     Physical Exam  Constitutional: She is oriented to person, place, and time. She appears well-developed and well-nourished.  HENT:  Head: Normocephalic and atraumatic.  Eyes: Conjunctivae and EOM are normal.  Neck: Normal range of motion.  Cardiovascular: Normal rate and regular rhythm.   Respiratory: Effort normal.  GI: Soft.  Musculoskeletal: Normal range of motion.  Neurological: She is alert and oriented to person, place, and time. She has normal reflexes.  Psychiatric: She has a normal mood and affect. Her behavior is normal. Judgment and thought content normal.    Prenatal labs: ABO, Rh: B/POS/-- (04/08 8756) Antibody: NEG (04/08 0907) Rubella: 4.03 (04/08 0907) RPR: NON REAC (07/02 1138)  HBsAg: NEGATIVE (04/08 0907)  HIV: NON REACTIVE (07/02 1138)  GBS: Positive (08/29 0000)   Assessment/Plan: G2P1001 @ 40.0 presents in SOL. -- Augment labor PRN contraction patter -- treat for GBS during labor -- Expectant mgmt  [name] 06/04/2013, 10:00 AM

## 2013-06-05 LAB — CBC
HCT: 26.8 % — ABNORMAL LOW (ref 36.0–46.0)
MCH: 31.8 pg (ref 26.0–34.0)
MCV: 89.6 fL (ref 78.0–100.0)
Platelets: 170 10*3/uL (ref 150–400)
RBC: 2.99 MIL/uL — ABNORMAL LOW (ref 3.87–5.11)
WBC: 10.1 10*3/uL (ref 4.0–10.5)

## 2013-06-05 MED ORDER — FERROUS SULFATE 325 (65 FE) MG PO TABS
325.0000 mg | ORAL_TABLET | Freq: Every day | ORAL | Status: DC
  Administered 2013-06-05 – 2013-06-06 (×2): 325 mg via ORAL
  Filled 2013-06-05 (×2): qty 1

## 2013-06-05 NOTE — Lactation Note (Signed)
This note was copied from the chart of Holly Varina Bearden. Lactation Consultation Note: Initial visit with mom. She called out for assist with latch. Reports that she is having trouble getting the baby to latch and he is crying for milk. Baby sucking on his tongue. After a few attempts baby latched well. Mom reports some pain with initial latch but eases off. Complaints of cramping with nursing. Reassurance given. Mom is experienced BF for 1 year with first baby. No questions at present. BF brochure given with resources for support after DC. To call for assist prn  Patient Name: Holly Oconnor [id number] Date: 06/05/2013 Reason for consult: Initial assessment   Maternal Data Formula Feeding for Exclusion: No Infant to breast within first hour of birth: Yes Does the patient have breastfeeding experience prior to this delivery?: Yes  Feeding Feeding Type: Breast Milk  LATCH Score/Interventions Latch: Grasps breast easily, tongue down, lips flanged, rhythmical sucking.  Audible Swallowing: A few with stimulation  Type of Nipple: Everted at rest and after stimulation  Comfort (Breast/Nipple): Filling, red/small blisters or bruises, mild/mod discomfort  Problem noted: Mild/Moderate discomfort  Hold (Positioning): Assistance needed to correctly position infant at breast and maintain latch. Intervention(s): Breastfeeding basics reviewed;Support Pillows  LATCH Score: 7  Lactation Tools Discussed/Used     Consult Status Consult Status: Follow-up Date: 06/06/13 Follow-up type: In-patient    Pamelia Hoit 06/05/2013, 8:04 AM
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
## 2013-06-05 NOTE — Progress Notes (Signed)
I spoke with and examined patient and agree with resident's note and plan of care.  Ahmani Daoud Ryan Camey Edell, MD OB Fellow 06/05/2013 12:56 AM   
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
## 2013-06-05 NOTE — Progress Notes (Signed)
UR chart review completed.  

## 2013-06-06 MED ORDER — IBUPROFEN 600 MG PO TABS: 600.0000 mg | ORAL_TABLET | Freq: Four times a day (QID) | ORAL | Status: DC

## 2013-06-06 NOTE — Discharge Summary (Signed)
Obstetric Discharge Summary Reason for Admission: onset of labor Prenatal Procedures: ultrasound Intrapartum Procedures: spontaneous vaginal delivery Postpartum Procedures: none Complications-Operative and Postpartum: 2nd degree perineal laceration Hemoglobin  Date Value Range Status  06/05/2013 9.5* 12.0 - 15.0 g/dL Final     HCT  Date Value Range Status  06/05/2013 26.8* 36.0 - 46.0 % Final   Ms. Timm was admitted for spontaneous onset of labor. Labor complicated by GBS positive status, which was treated with penicillin. Delivery of baby was complicated by 2nd degree laceration, which was repaired. Postpartum course has been uncomplicated. She is ambulating well, with no lightheadedness and is tolerating an oral diet. She is breastfeeding, desires POPs for contraception and will get an outpatient circumcision for her boy.  Physical Exam:  General: alert, cooperative and no distress Lochia: appropriate Uterine Fundus: firm Incision: n/a DVT Evaluation: No evidence of DVT seen on physical exam.  Discharge Diagnoses: Term Pregnancy-delivered  Discharge Information: Date: 06/06/2013 Activity: pelvic rest Diet: routine Medications: PNV and Ibuprofen Condition: stable Instructions: refer to practice specific booklet Discharge to: home   Newborn Data: Live born female  Birth Weight: 7 lb 2.8 oz (3255 g) APGAR: 8, 9  Home with mother.  Holly Oconnor 06/06/2013, 7:49 AM  Seen also by me Agree with note Wynelle Bourgeois CNM

## 2013-06-07 NOTE — Discharge Summary (Signed)
Attestation of Attending Supervision of Advanced Practitioner (CNM/NP): Evaluation and management procedures were performed by the Advanced Practitioner under my supervision and collaboration.  I have reviewed the Advanced Practitioner's note and chart, and I agree with the management and plan.  Corrinna Karapetyan 06/07/2013 10:14 AM

## 2013-07-12 ENCOUNTER — Ambulatory Visit: Payer: Medicaid Other | Admitting: Obstetrics and Gynecology

## 2013-07-12 DIAGNOSIS — N898 Other specified noninflammatory disorders of vagina: Secondary | ICD-10-CM

## 2013-07-12 NOTE — Progress Notes (Unsigned)
Pt. C/o of neck and back pain and vaginal pain where there are stiches. Requesting ibuprofen refill.

## 2013-07-12 NOTE — Progress Notes (Unsigned)
  Subjective:     Holly Oconnor is a 32 y.o. female who presents for a postpartum visit. She is 4 weeks postpartum following a spontaneous vaginal delivery. I have fully reviewed the prenatal and intrapartum course. The delivery was at 40 gestational weeks. Outcome: spontaneous vaginal delivery. Anesthesia: epidural. Postpartum course has been complicated by pain at site of stitches with some BMs. . Baby's course has been uncomplicated. Baby is feeding by breast. Bleeding no bleeding. Bowel function is normal. Bladder function is normal. Patient is not sexually active. Contraception method is none. Postpartum depression screening: negative.  The following portions of the patient's history were reviewed and updated as appropriate: allergies, current medications, past family history, past medical history, past social history, past surgical history and problem list.  Review of Systems Pertinent items are noted in HPI.   Objective:    BP 140/87  Pulse 75  Temp(Src) 97.5 F (36.4 C) (Oral)  Ht 5\' 3"  (1.6 m)  Wt 140 lb (63.504 kg)  BMI 24.81 kg/m2  Breastfeeding? Yes  General:  alert, cooperative and no distress   Breasts:  inspection negative, no nipple discharge or bleeding, no masses or nodularity palpable  Lungs: clear to auscultation bilaterally  Heart:  regular rate and rhythm, S1, S2 normal, no murmur, click, rub or gallop  Abdomen: soft, non-tender; bowel sounds normal; no masses,  no organomegaly   Vulva:  1/4 inch suture end and knot , other suture material loosely in place> pt declines to have me clip it though she is experiencing discomfort.  Vagina: normal vagina, no discharge, exudate, lesion, or erythema  Cervix:  no cervical motion tenderness  Corpus: normal size, contour, position, consistency, mobility, non-tender  Adnexa:  not evaluated  Rectal Exam: Not performed. Pt refused        Assessment:     4 wks postpartum exam. Pap smear not done at today's visit.   Plan:    1. Contraception: abstinence 2. Information LARCs given and encouraged. Pt undecided and will read infor from AVS 3. Follow up in: 7 months or as needed.

## 2013-07-12 NOTE — Patient Instructions (Signed)
Contraception Choices  Contraception (birth control) is the use of any methods or devices to prevent pregnancy. Below are some methods to help avoid pregnancy.  HORMONAL METHODS   · Contraceptive implant. This is a thin, plastic tube containing progesterone hormone. It does not contain estrogen hormone. Your caregiver inserts the tube in the inner part of the upper arm. The tube can remain in place for up to 3 years. After 3 years, the implant must be removed. The implant prevents the ovaries from releasing an egg (ovulation), thickens the cervical mucus which prevents sperm from entering the uterus, and thins the lining of the inside of the uterus.  · Progesterone-only injections. These injections are given every 3 months by your caregiver to prevent pregnancy. This synthetic progesterone hormone stops the ovaries from releasing eggs. It also thickens cervical mucus and changes the uterine lining. This makes it harder for sperm to survive in the uterus.  · Birth control pills. These pills contain estrogen and progesterone hormone. They work by stopping the egg from forming in the ovary (ovulation). Birth control pills are prescribed by a caregiver. Birth control pills can also be used to treat heavy periods.  · Minipill. This type of birth control pill contains only the progesterone hormone. They are taken every day of each month and must be prescribed by your caregiver.  · Birth control patch. The patch contains hormones similar to those in birth control pills. It must be changed once a week and is prescribed by a caregiver.  · Vaginal ring. The ring contains hormones similar to those in birth control pills. It is left in the vagina for 3 weeks, removed for 1 week, and then a new one is put back in place. The patient must be comfortable inserting and removing the ring from the vagina. A caregiver's prescription is necessary.  · Emergency contraception. Emergency contraceptives prevent pregnancy after unprotected  sexual intercourse. This pill can be taken right after sex or up to 5 days after unprotected sex. It is most effective the sooner you take the pills after having sexual intercourse. Emergency contraceptive pills are available without a prescription. Check with your pharmacist. Do not use emergency contraception as your only form of birth control.  BARRIER METHODS   · Female condom. This is a thin sheath (latex or rubber) that is worn over the penis during sexual intercourse. It can be used with spermicide to increase effectiveness.  · Female condom. This is a soft, loose-fitting sheath that is put into the vagina before sexual intercourse.  · Diaphragm. This is a soft, latex, dome-shaped barrier that must be fitted by a caregiver. It is inserted into the vagina, along with a spermicidal jelly. It is inserted before intercourse. The diaphragm should be left in the vagina for 6 to 8 hours after intercourse.  · Cervical cap. This is a round, soft, latex or plastic cup that fits over the cervix and must be fitted by a caregiver. The cap can be left in place for up to 48 hours after intercourse.  · Sponge. This is a soft, circular piece of polyurethane foam. The sponge has spermicide in it. It is inserted into the vagina after wetting it and before sexual intercourse.  · Spermicides. These are chemicals that kill or block sperm from entering the cervix and uterus. They come in the form of creams, jellies, suppositories, foam, or tablets. They do not require a prescription. They are inserted into the vagina with an applicator before having sexual intercourse.   The process must be repeated every time you have sexual intercourse.  INTRAUTERINE CONTRACEPTION  · Intrauterine device (IUD). This is a T-shaped device that is put in a woman's uterus during a menstrual period to prevent pregnancy. There are 2 types:  · Copper IUD. This type of IUD is wrapped in copper wire and is placed inside the uterus. Copper makes the uterus and  fallopian tubes produce a fluid that kills sperm. It can stay in place for 10 years.  · Hormone IUD. This type of IUD contains the hormone progestin (synthetic progesterone). The hormone thickens the cervical mucus and prevents sperm from entering the uterus, and it also thins the uterine lining to prevent implantation of a fertilized egg. The hormone can weaken or kill the sperm that get into the uterus. It can stay in place for 5 years.  PERMANENT METHODS OF CONTRACEPTION  · Female tubal ligation. This is when the woman's fallopian tubes are surgically sealed, tied, or blocked to prevent the egg from traveling to the uterus.  · Female sterilization. This is when the female has the tubes that carry sperm tied off (vasectomy). This blocks sperm from entering the vagina during sexual intercourse. After the procedure, the man can still ejaculate fluid (semen).  NATURAL PLANNING METHODS  · Natural family planning. This is not having sexual intercourse or using a barrier method (condom, diaphragm, cervical cap) on days the woman could become pregnant.  · Calendar method. This is keeping track of the length of each menstrual cycle and identifying when you are fertile.  · Ovulation method. This is avoiding sexual intercourse during ovulation.  · Symptothermal method. This is avoiding sexual intercourse during ovulation, using a thermometer and ovulation symptoms.  · Post-ovulation method. This is timing sexual intercourse after you have ovulated.  Regardless of which type or method of contraception you choose, it is important that you use condoms to protect against the transmission of sexually transmitted diseases (STDs). Talk with your caregiver about which form of contraception is most appropriate for you.  Document Released: 08/31/2005 Document Revised: 11/23/2011 Document Reviewed: 01/07/2011  [employer]® Patient Information ©2014 [employer].

## 2013-07-13 LAB — WET PREP, GENITAL: Trich, Wet Prep: NONE SEEN

## 2013-12-19 IMAGING — US US OB FOLLOW-UP
1 series · 12 of 28 positions shown · non-contrast
Comparison: none

[Series 1: us ob follow up · 12 of 52 slices shown]
[im 2/52]
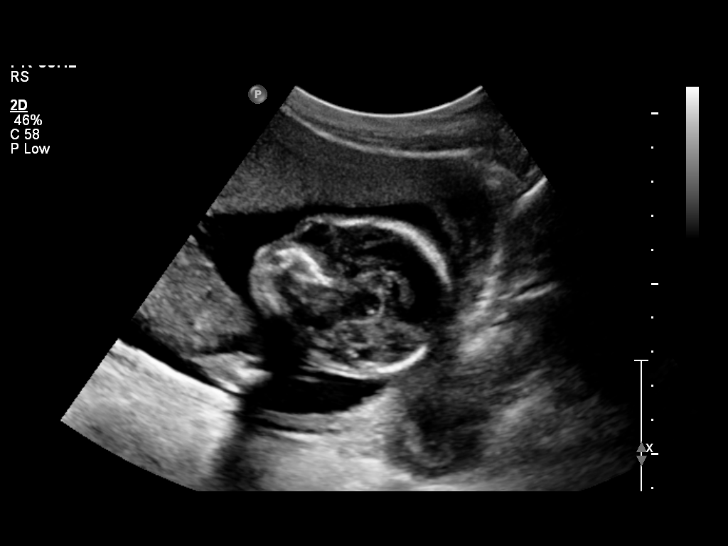
[im 6/52]
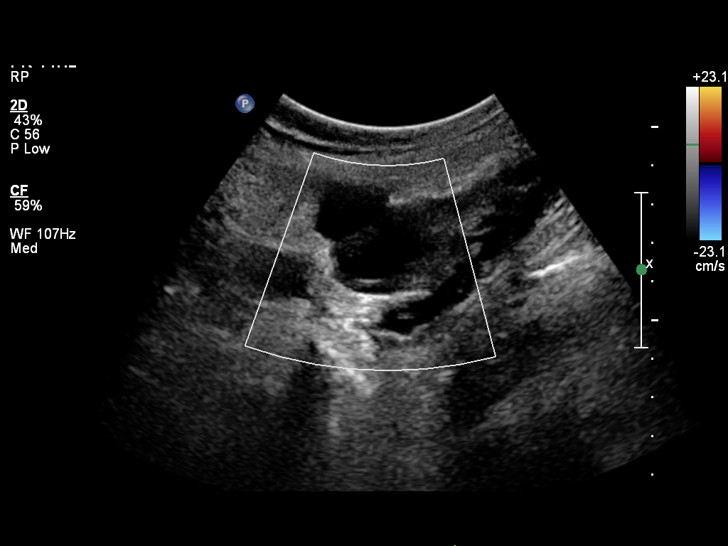
[im 10/52]
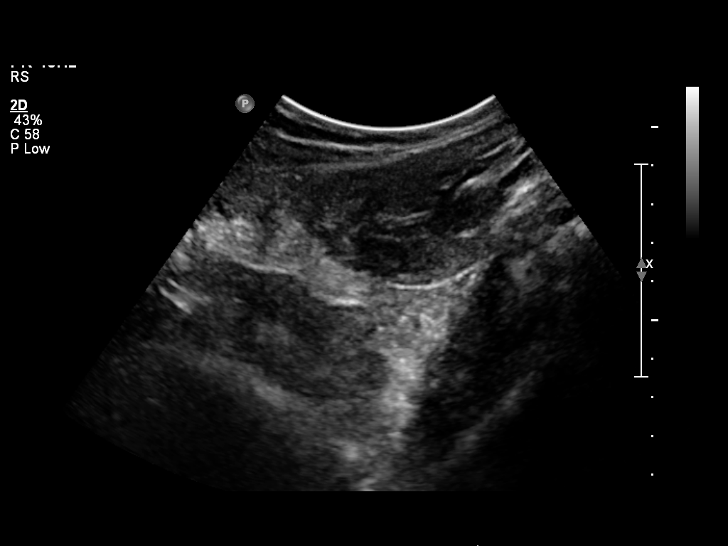
[im 16/52]
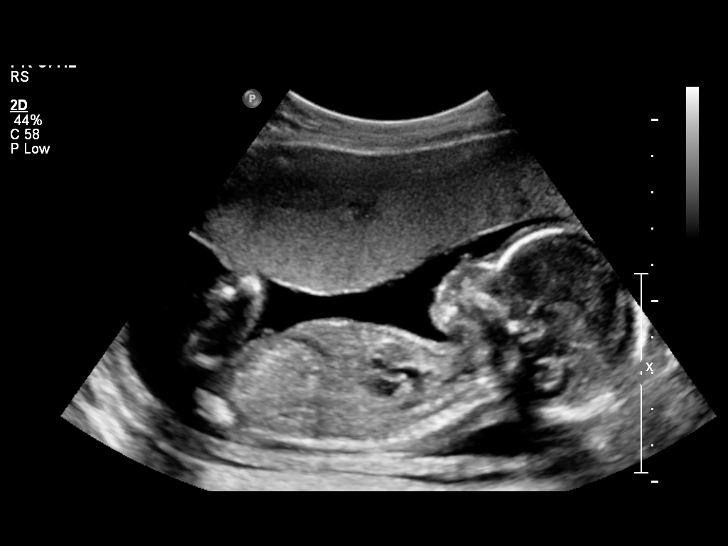
[im 19/52]
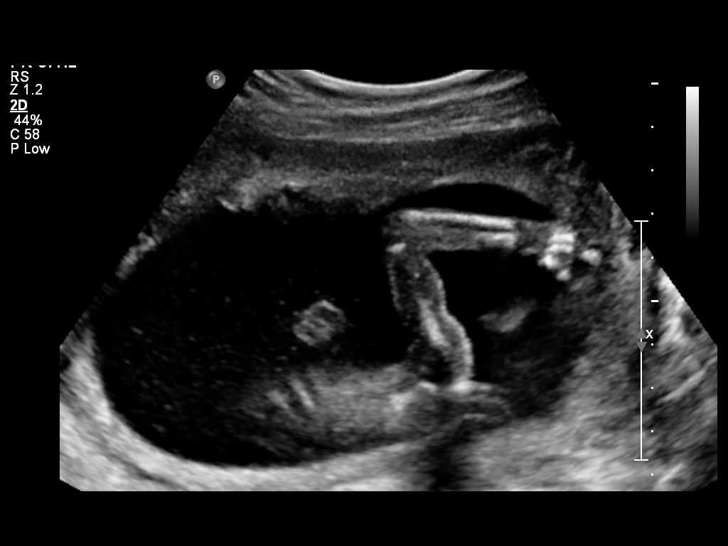
[im 23/52]
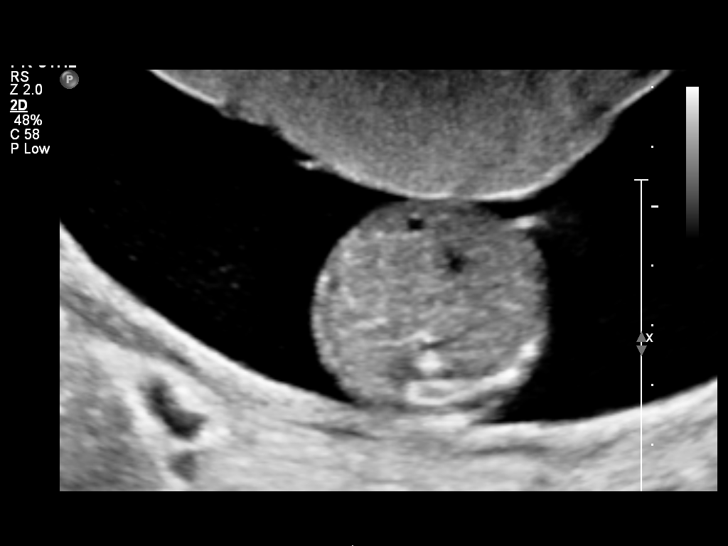
[im 29/52]
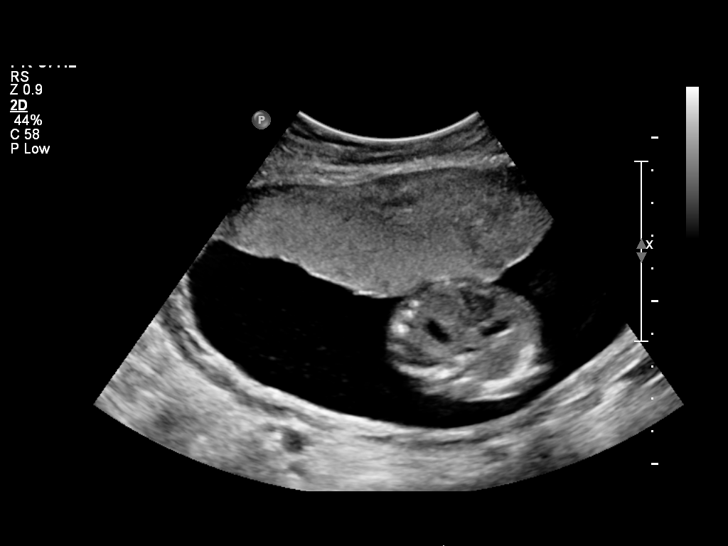
[im 33/52]
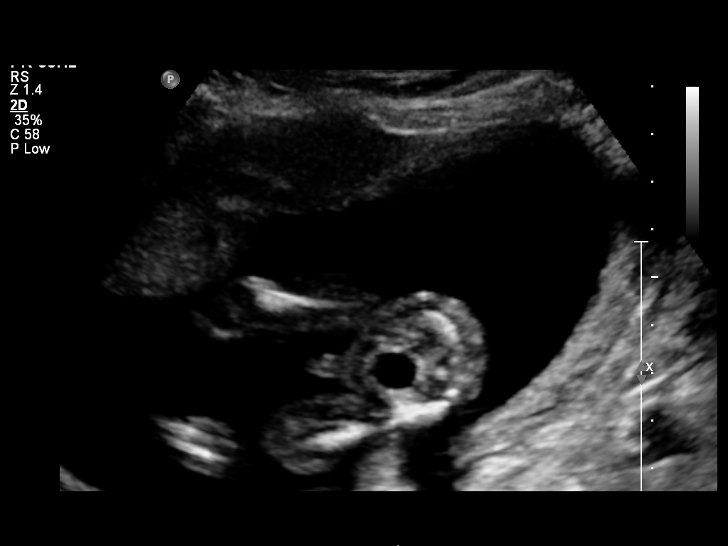
[im 36/52]
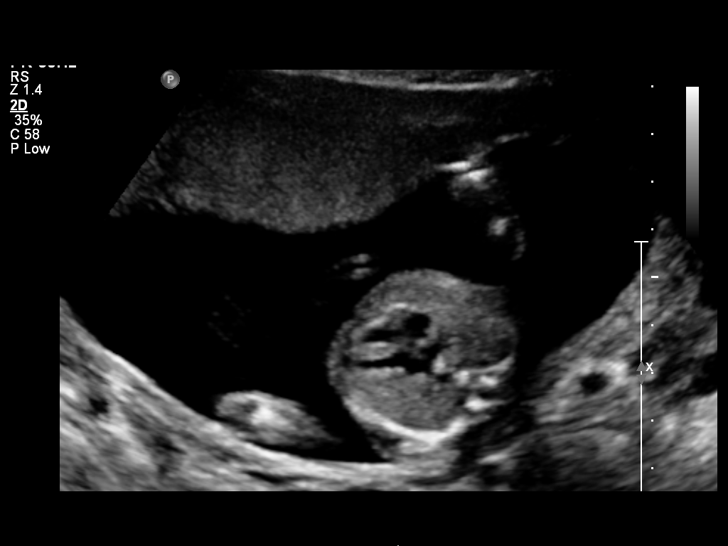
[im 42/52]
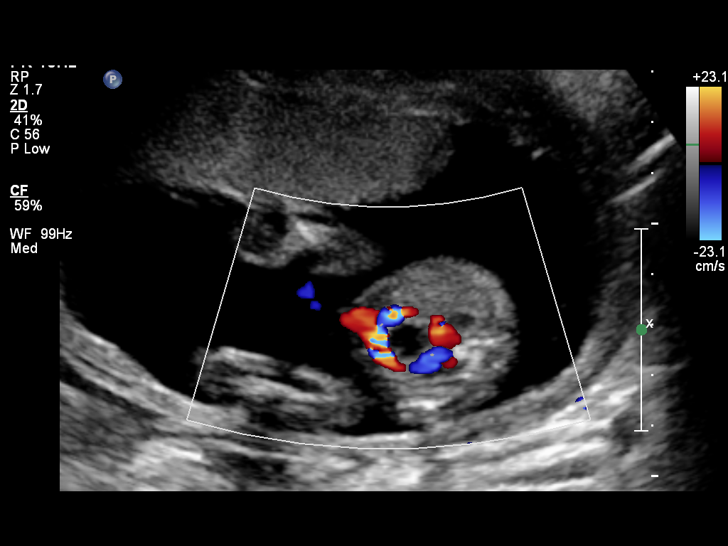
[im 46/52]
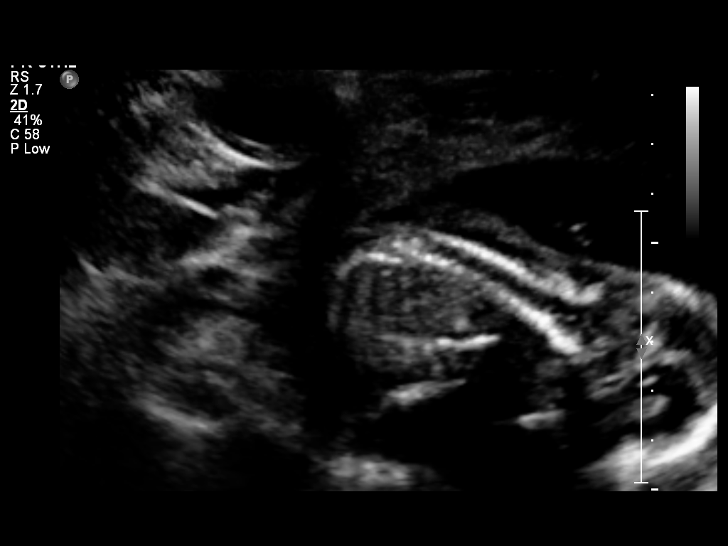
[im 50/52]
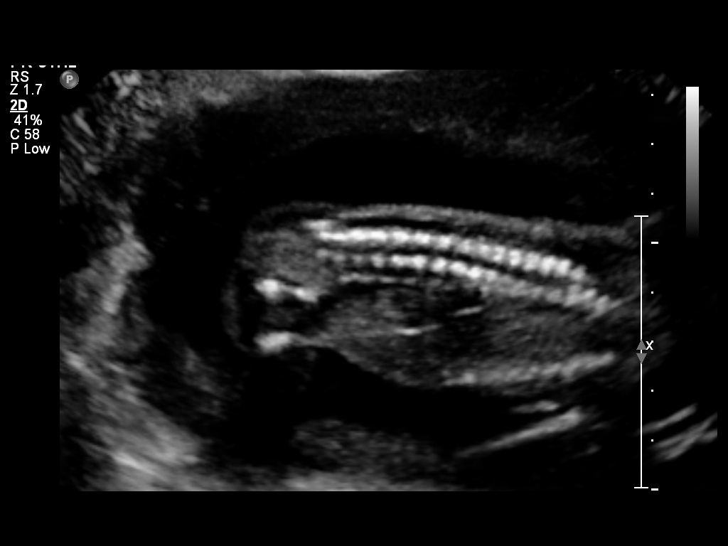

[12 of 28 positions shown; findings below may reference images not displayed]

OBSTETRICS REPORT
                      (Signed Final 01/09/2013 [DATE])

Service(s) Provided

 US OB FOLLOW UP                                       76816.1
Indications

 No or Little Prenatal Care
 Follow-up incomplete fetal anatomic evaluation
Fetal Evaluation

 Num Of Fetuses:    1
 Fetal Heart Rate:  135                         bpm
 Cardiac Activity:  Observed
 Presentation:      Cephalic
 Placenta:          Anterior, above cervical os
 P. Cord            Previously Visualized
 Insertion:

 Amniotic Fluid
 AFI FV:      Subjectively within normal limits
                                             Larg Pckt:     5.2  cm
Gestational Age

 LMP:           17w 4d       Date:   09/08/12                 EDD:   06/15/13
 Best:          19w 1d    Det. By:   U/S (12/23/12)           EDD:   06/04/13
Anatomy

 Cranium:          Appears normal         Aortic Arch:      Basic anatomy
                                                            exam per order
 Fetal Cavum:      Appears normal         Ductal Arch:      Basic anatomy
                                                            exam per order
 Ventricles:       Appears normal         Diaphragm:        Appears normal
 Choroid Plexus:   Appears normal         Stomach:          Appears normal, left
                                                            sided
 Cerebellum:       Appears normal         Abdomen:          Appears normal
 Posterior Fossa:  Previously seen        Abdominal Wall:   Appears nml (cord
                                                            insert, abd wall)
 Nuchal Fold:      Previously seen        Cord Vessels:     Appears normal (3
                                                            vessel cord)
 Face:             Basic anatomy          Kidneys:          Appear normal
                   exam per order
 Lips:             Appears normal         Bladder:          Appears normal
 Heart:            Appears normal         Spine:            Appears normal
                   (4CH, axis, and
                   situs)
 RVOT:             Appears normal         Lower             Appears normal
                                          Extremities:
 LVOT:             Appears normal         Upper             Appears normal
                                          Extremities:

 Other:  Male gender. Technically difficult due to fetal position.
Cervix Uterus Adnexa

 Cervical Length:   3.1       cm

 Cervix:       Normal appearance by transabdominal scan.

 Left Ovary:   Within normal limits.
 Right Ovary:  Not visualized.
 Adnexa:     No abnormality visualized.
Impression

 Single living IUP with assigned GA of 19w 1d.
 No fetal anatomic abnormality is identified. The fetal lips,
 outflow tracts, and spine are visualized today and appear
 normal.
 Normal cervical length and amniotic fluid volume.

 Thank you for sharing in the care of Ms. PIERRE-MARIE BALE with
 questions or concerns.

## 2014-07-16 ENCOUNTER — Encounter (HOSPITAL_COMMUNITY): Payer: Self-pay | Admitting: *Deleted

## 2014-08-07 ENCOUNTER — Ambulatory Visit: Payer: Medicaid Other | Attending: Family Medicine

## 2014-09-17 ENCOUNTER — Encounter (HOSPITAL_COMMUNITY): Payer: Self-pay | Admitting: Emergency Medicine

## 2014-09-17 ENCOUNTER — Emergency Department (INDEPENDENT_AMBULATORY_CARE_PROVIDER_SITE_OTHER)
Admission: EM | Admit: 2014-09-17 | Discharge: 2014-09-17 | Disposition: A | Discharge status: Home / Self Care | Payer: Self-pay | Source: Home / Self Care | Attending: Family Medicine | Admitting: Family Medicine

## 2014-09-17 DIAGNOSIS — J069 Acute upper respiratory infection, unspecified: Secondary | ICD-10-CM

## 2014-09-17 DIAGNOSIS — H6123 Impacted cerumen, bilateral: Secondary | ICD-10-CM

## 2014-09-17 MED ORDER — HYDROCODONE-ACETAMINOPHEN 5-325 MG PO TABS: 0.5000 | ORAL_TABLET | Freq: Every evening | ORAL | Status: DC | PRN

## 2014-09-17 MED ORDER — IPRATROPIUM BROMIDE 0.06 % NA SOLN: 2.0000 | Freq: Four times a day (QID) | NASAL | Status: DC

## 2014-09-17 NOTE — Discharge Instructions (Signed)
Thank you for coming in today. Call or go to the emergency room if you get worse, have trouble breathing, have chest pains, or palpitations.  Use Debrox drops as needed.   Upper Respiratory Infection, Adult An upper respiratory infection (URI) is also sometimes known as the common cold. The upper respiratory tract includes the nose, sinuses, throat, trachea, and bronchi. Bronchi are the airways leading to the lungs. Most people improve within 1 week, but symptoms can last up to 2 weeks. A residual cough may last even longer.  CAUSES Many different viruses can infect the tissues lining the upper respiratory tract. The tissues become irritated and inflamed and often become very moist. Mucus production is also common. A cold is contagious. You can easily spread the virus to others by oral contact. This includes kissing, sharing a glass, coughing, or sneezing. Touching your mouth or nose and then touching a surface, which is then touched by another person, can also spread the virus. SYMPTOMS  Symptoms typically develop 1 to 3 days after you come in contact with a cold virus. Symptoms vary from person to person. They may include:  Runny nose.  Sneezing.  Nasal congestion.  Sinus irritation.  Sore throat.  Loss of voice (laryngitis).  Cough.  Fatigue.  Muscle aches.  Loss of appetite.  Headache.  Low-grade fever. DIAGNOSIS  You might diagnose your own cold based on familiar symptoms, since most people get a cold 2 to 3 times a year. Your caregiver can confirm this based on your exam. Most importantly, your caregiver can check that your symptoms are not due to another disease such as strep throat, sinusitis, pneumonia, asthma, or epiglottitis. Blood tests, throat tests, and X-rays are not necessary to diagnose a common cold, but they may sometimes be helpful in excluding other more serious diseases. Your caregiver will decide if any further tests are required. RISKS AND COMPLICATIONS    You may be at risk for a more severe case of the common cold if you smoke cigarettes, have chronic heart disease (such as heart failure) or lung disease (such as asthma), or if you have a weakened immune system. The very young and very old are also at risk for more serious infections. Bacterial sinusitis, middle ear infections, and bacterial pneumonia can complicate the common cold. The common cold can worsen asthma and chronic obstructive pulmonary disease (COPD). Sometimes, these complications can require emergency medical care and may be life-threatening. PREVENTION  The best way to protect against getting a cold is to practice good hygiene. Avoid oral or hand contact with people with cold symptoms. Wash your hands often if contact occurs. There is no clear evidence that vitamin C, vitamin E, echinacea, or exercise reduces the chance of developing a cold. However, it is always recommended to get plenty of rest and practice good nutrition. TREATMENT  Treatment is directed at relieving symptoms. There is no cure. Antibiotics are not effective, because the infection is caused by a virus, not by bacteria. Treatment may include:  Increased fluid intake. Sports drinks offer valuable electrolytes, sugars, and fluids.  Breathing heated mist or steam (vaporizer or shower).  Eating chicken soup or other clear broths, and maintaining good nutrition.  Getting plenty of rest.  Using gargles or lozenges for comfort.  Controlling fevers with ibuprofen or acetaminophen as directed by your caregiver.  Increasing usage of your inhaler if you have asthma. Zinc gel and zinc lozenges, taken in the first 24 hours of the common cold, can shorten  the duration and lessen the severity of symptoms. Pain medicines may help with fever, muscle aches, and throat pain. A variety of non-prescription medicines are available to treat congestion and runny nose. Your caregiver can make recommendations and may suggest nasal or  lung inhalers for other symptoms.  HOME CARE INSTRUCTIONS   Only take over-the-counter or prescription medicines for pain, discomfort, or fever as directed by your caregiver.  Use a warm mist humidifier or inhale steam from a shower to increase air moisture. This may keep secretions moist and make it easier to breathe.  Drink enough water and fluids to keep your urine clear or pale yellow.  Rest as needed.  Return to work when your temperature has returned to normal or as your caregiver advises. You may need to stay home longer to avoid infecting others. You can also use a face mask and careful hand washing to prevent spread of the virus. SEEK MEDICAL CARE IF:   After the first few days, you feel you are getting worse rather than better.  You need your caregiver's advice about medicines to control symptoms.  You develop chills, worsening shortness of breath, or brown or red sputum. These may be signs of pneumonia.  You develop yellow or brown nasal discharge or pain in the face, especially when you bend forward. These may be signs of sinusitis.  You develop a fever, swollen neck glands, pain with swallowing, or white areas in the back of your throat. These may be signs of strep throat. SEEK IMMEDIATE MEDICAL CARE IF:   You have a fever.  You develop severe or persistent headache, ear pain, sinus pain, or chest pain.  You develop wheezing, a prolonged cough, cough up blood, or have a change in your usual mucus (if you have chronic lung disease).  You develop sore muscles or a stiff neck. Document Released: 02/24/2001 Document Revised: 11/23/2011 Document Reviewed: 12/06/2013 [facility] Patient Information 2015 [place], [place]. This information is not intended to replace advice given to you by your health care provider. Make sure you discuss any questions you have with your health care provider.

## 2014-09-17 NOTE — ED Provider Notes (Signed)
Holly Oconnor is a 34 y.o. female who presents to Urgent Care today for cough, congestion, runny nose, sneezing. Symptoms present for about a week. No shortness of breath chest pain palpitations or trouble breathing. Patient has tried Tylenol which helped bit. No vomiting or diarrhea.   Past Medical History  Diagnosis Date  . AS (sickle cell trait) 12/22/2012   History reviewed. No pertinent past surgical history. History  Substance Use Topics  . Smoking status: Never Smoker   . Smokeless tobacco: Never Used  . Alcohol Use: No   ROS as above Medications: No current facility-administered medications for this encounter.   Current Outpatient Prescriptions  Medication Sig Dispense Refill  . HYDROcodone-acetaminophen (NORCO/VICODIN) 5-325 MG per tablet Take 0.5 tablets by mouth at bedtime as needed (cough). 6 tablet 0  . ibuprofen (ADVIL,MOTRIN) 600 MG tablet Take 1 tablet (600 mg total) by mouth every 6 (six) hours. 30 tablet 0  . ipratropium (ATROVENT) 0.06 % nasal spray Place 2 sprays into both nostrils 4 (four) times daily. 15 mL 1  . Prenatal Vit-Fe Fumarate-FA (MULTIVITAMIN-PRENATAL) 27-0.8 MG TABS Take 1 tablet by mouth daily at 12 noon. 30 each 6   No Known Allergies   Exam:  BP 146/82 mmHg  Pulse 85  Temp(Src) 98.2 F (36.8 C) (Oral)  Resp 16  SpO2 100%  LMP 09/10/2014 Gen: Well NAD HEENT: EOMI,  MMM posterior pharynx is difficult to visualize due to patient's tongue. No significant erythema visible. Tympanic membranes are occluded by cerumen bilaterally. Clear nasal discharge present. Lungs: Normal work of breathing. CTABL Heart: RRR no MRG Abd: NABS, Soft. Nondistended, Nontender Exts: Brisk capillary refill, warm and well perfused.   No results found for this or any previous visit (from the past 24 hour(s)). No results found.  Assessment and Plan: 34 y.o. female with viral URI and cerumen impaction. Patient declines cerumen irrigation.  Plan to treat with Atrovent  nasal spray, Norco for cough suppression, and Debrox ear drops. Follow-up as needed.  Discussed warning signs or symptoms. Please see discharge instructions. Patient expresses understanding.     Rodolph Bong, MD 09/17/14 [phone]

## 2015-04-29 ENCOUNTER — Ambulatory Visit: Payer: Self-pay | Attending: Family Medicine

## 2015-09-15 NOTE — L&D Delivery Note (Signed)
Delivery Note Progressed precipitously in labor while in MAU.  Head emerged as I arrived in room.  At 10:55 PM a viable and healthy female was delivered via Vaginal, Spontaneous Delivery (Presentation: OA;  ).  APGAR: 9, 9; weight  .   Placenta status: spontaneous and grossly intact with 3 vessel cord:  with the following complications: . Body and foot cord.   Anesthesia:  none Episiotomy:  None Lacerations:  none Suture Repair: none Est. Blood Loss (mL):   100  Mom to postpartum.  Baby to Couplet care / Skin to Skin.  Wynelle BourgeoisWILLIAMS,Holly Mouser 05/22/2016, 11:19 PM

## 2015-11-11 ENCOUNTER — Emergency Department (HOSPITAL_COMMUNITY)
Admission: EM | Admit: 2015-11-11 | Discharge: 2015-11-11 | Disposition: A | Discharge status: Home / Self Care | Payer: Medicaid Other | Source: Home / Self Care | Attending: Family Medicine | Admitting: Family Medicine

## 2015-11-11 ENCOUNTER — Encounter (HOSPITAL_COMMUNITY): Payer: Self-pay | Admitting: *Deleted

## 2015-11-11 DIAGNOSIS — J069 Acute upper respiratory infection, unspecified: Secondary | ICD-10-CM

## 2015-11-11 DIAGNOSIS — H6123 Impacted cerumen, bilateral: Secondary | ICD-10-CM

## 2015-11-11 MED ORDER — IPRATROPIUM BROMIDE 0.06 % NA SOLN: 2.0000 | Freq: Four times a day (QID) | NASAL | Status: DC

## 2015-11-11 NOTE — ED Provider Notes (Signed)
CSN: [id number]     Arrival date & time 11/11/15  1309 History   First MD Initiated Contact with Patient 11/11/15 1446     Chief Complaint  Patient presents with  . URI   (Consider location/radiation/quality/duration/timing/severity/associated sxs/prior Treatment) Patient is a 35 y.o. female presenting with URI. The history is provided by the patient.  URI Presenting symptoms: congestion and rhinorrhea   Presenting symptoms: no fever   Severity:  Mild Onset quality:  Gradual Duration:  2 weeks Progression:  Unchanged Chronicity:  New Relieved by:  None tried Worsened by:  Nothing tried Ineffective treatments:  None tried   Past Medical History  Diagnosis Date  . AS (sickle cell trait) (HCC) 12/22/2012   History reviewed. No pertinent past surgical history. History reviewed. No pertinent family history. Social History  Substance Use Topics  . Smoking status: Never Smoker   . Smokeless tobacco: Never Used  . Alcohol Use: No   OB History    Gravida Para Term Preterm AB TAB SAB Ectopic Multiple Living   0 0 0 0 0 0 2     Review of Systems  Constitutional: Negative.  Negative for fever.  HENT: Positive for congestion, postnasal drip and rhinorrhea.   Respiratory: Negative.   Cardiovascular: Negative.   All other systems reviewed and are negative.   Allergies  Review of patient's allergies indicates no known allergies.  Home Medications   Prior to Admission medications   Medication Sig Start Date End Date Taking? Authorizing Provider  HYDROcodone-acetaminophen (NORCO/VICODIN) 5-325 MG per tablet Take 0.5 tablets by mouth at bedtime as needed (cough). 09/17/14   Rodolph Bong, MD  ibuprofen (ADVIL,MOTRIN) 600 MG tablet Take 1 tablet (600 mg total) by mouth every 6 (six) hours. 06/06/13   Narda Bonds, MD  ipratropium (ATROVENT) 0.06 % nasal spray Place 2 sprays into both nostrils 4 (four) times daily. 09/17/14   Rodolph Bong, MD  Prenatal Vit-Fe Fumarate-FA  (MULTIVITAMIN-PRENATAL) 27-0.8 MG TABS Take 1 tablet by mouth daily at 12 noon. 02/15/13   Armando Reichert, CNM   Meds Ordered and Administered this Visit  Medications - No data to display  BP 122/70 mmHg  Pulse 82  Temp(Src) 98.6 F (37 C) (Oral)  Resp 16  SpO2 100%  Breastfeeding? No No data found.   Physical Exam  Constitutional: She appears well-developed and well-nourished. No distress.  HENT:  Head: Normocephalic.  Ears:  Nose: Mucosal edema and rhinorrhea present.  Mouth/Throat: Oropharynx is clear and moist.  Eyes: Conjunctivae and EOM are normal. Pupils are equal, round, and reactive to light.  Neck: Normal range of motion. Neck supple.  Cardiovascular: Normal rate.   Pulmonary/Chest: Effort normal and breath sounds normal.  Lymphadenopathy:    She has no cervical adenopathy.  Skin: Skin is warm and dry.  Nursing note and vitals reviewed.   ED Course  Procedures (including critical care time)  Labs Review Labs Reviewed - No data to display  Imaging Review No results found.   Visual Acuity Review  Right Eye Distance:   Left Eye Distance:   Bilateral Distance:    Right Eye Near:   Left Eye Near:    Bilateral Near:         MDM  No diagnosis found. Meds ordered this encounter  Medications  . ipratropium (ATROVENT) 0.06 % nasal spray    Sig: Place 2 sprays into both nostrils 4 (four) times daily.    Dispense:  15 mL    Refill:  1       Linna Hoff, MD 11/11/15 [phone]

## 2015-11-11 NOTE — ED Notes (Addendum)
Pt  Reports  Symptoms  Of   Stuffy  Nose   Congestion     With  Symptoms      s  3  Weeks     pt is  2 1/2 months  Pregnant            Reports  Fatigue  And  Headache

## 2015-11-11 NOTE — Discharge Instructions (Signed)
Drink plenty of fluids as discussed, use medicine as prescribed, and mucinex or delsym for cough. Return or see your doctor if further problems °

## 2015-11-20 ENCOUNTER — Encounter: Payer: Self-pay | Admitting: Advanced Practice Midwife

## 2015-11-20 ENCOUNTER — Other Ambulatory Visit (HOSPITAL_COMMUNITY)
Admission: RE | Admit: 2015-11-20 | Discharge: 2015-11-20 | Disposition: A | Discharge status: Home / Self Care | Payer: Medicaid Other | Source: Ambulatory Visit | Attending: Advanced Practice Midwife | Admitting: Advanced Practice Midwife

## 2015-11-20 ENCOUNTER — Ambulatory Visit (INDEPENDENT_AMBULATORY_CARE_PROVIDER_SITE_OTHER): Payer: Medicaid Other | Admitting: Advanced Practice Midwife

## 2015-11-20 VITALS — BP 116/78 | HR 86 | Temp 98.0°F | Wt 138.4 lb

## 2015-11-20 DIAGNOSIS — Z124 Encounter for screening for malignant neoplasm of cervix: Secondary | ICD-10-CM | POA: Diagnosis not present

## 2015-11-20 DIAGNOSIS — Z3482 Encounter for supervision of other normal pregnancy, second trimester: Secondary | ICD-10-CM

## 2015-11-20 DIAGNOSIS — Z01419 Encounter for gynecological examination (general) (routine) without abnormal findings: Secondary | ICD-10-CM | POA: Diagnosis present

## 2015-11-20 DIAGNOSIS — Z3480 Encounter for supervision of other normal pregnancy, unspecified trimester: Secondary | ICD-10-CM | POA: Insufficient documentation

## 2015-11-20 DIAGNOSIS — Z113 Encounter for screening for infections with a predominantly sexual mode of transmission: Secondary | ICD-10-CM

## 2015-11-20 DIAGNOSIS — Z3492 Encounter for supervision of normal pregnancy, unspecified, second trimester: Secondary | ICD-10-CM

## 2015-11-20 DIAGNOSIS — Z1389 Encounter for screening for other disorder: Secondary | ICD-10-CM

## 2015-11-20 NOTE — Progress Notes (Signed)
   Subjective:    Holly Oconnor is a G3P2002 8159w0d being seen today for her first obstetrical visit.  Her obstetrical history is significant for NSVD x 2. Pregnancy history fully reviewed.  Patient reports no complaints.  Filed Vitals:   11/20/15 1305  BP: 116/78  Pulse: 86  Temp: 98 F (36.7 C)  Weight: 138 lb 6.4 oz (62.778 kg)    HISTORY: OB History  Gravida Para Term Preterm AB SAB TAB Ectopic Multiple Living  3 2 2  0 0 0 0 0 0 2    # Outcome Date GA Lbr Len/2nd Weight Sex Delivery Anes PTL Lv  3 Current           2 Term 06/04/13 8158w0d 08:00 / 00:11 7 lb 2.8 oz (3.255 kg) M Vag-Spont None  Y  1 Term 10/07/10 6827w2d  7 lb (3.175 kg) F Vag-Spont EPI  Y     Past Medical History  Diagnosis Date  . AS (sickle cell trait) (HCC) 12/22/2012   No past surgical history on file. No family history on file.   Exam    Uterus:     Pelvic Exam:    Perineum: No Hemorrhoids, Normal Perineum   Vulva: normal   Vagina:  normal mucosa, normal discharge   pH:    Cervix: multiparous appearance and no bleeding following Pap   Adnexa: not evaluated   Bony Pelvis: average  System: Breast:  normal appearance, no masses or tenderness   Skin: normal coloration and turgor, no rashes    Neurologic: oriented, normal, gait normal; reflexes normal and symmetric   Extremities: normal strength, tone, and muscle mass, ROM of all joints is normal   HEENT sclera clear, anicteric   Mouth/Teeth mucous membranes moist, pharynx normal without lesions and dental hygiene good   Neck supple and no masses   Cardiovascular:    Respiratory:  appears well, vitals normal, no respiratory distress, acyanotic, normal RR, ear and throat exam is normal, neck free of mass or lymphadenopathy   Abdomen: soft, non-tender; bowel sounds normal; no masses,  no organomegaly   Urinary: urethral meatus normal      Assessment:    Pregnancy: [id number] Patient Active Problem List   Diagnosis Date Noted  . Supervision of  normal subsequent pregnancy 11/20/2015  . Environmental allergies 04/12/2013  . AS (sickle cell trait) (HCC) 12/22/2012      1. Encounter for routine screening for malformation using ultrasonics  - US MFM OB COMP + 14 WK; Future  2. Supervision of normal pregnancy, second trimester  - Prenatal Profile - Culture, OB Urine - Prescript Monitor Profile(19) - Cytology - PAP - [facility]Chlamydia probe amp [facility] - US MFM OB COMP + 14 WK; Future      Plan:     Initial labs drawn. Prenatal vitamins. Problem list reviewed and updated. Genetic Screening discussed Quad Screen: undecided.  Ultrasound discussed; fetal survey: ordered.  Follow up in 4 weeks.    LEFTWICH-KIRBY, Shaqueena Mauceri 11/20/2015

## 2015-11-20 NOTE — Patient Instructions (Signed)

## 2015-11-21 LAB — PRENATAL PROFILE (SOLSTAS)
Antibody Screen: NEGATIVE
BASOS PCT: 0 % (ref 0–1)
Basophils Absolute: 0 10*3/uL (ref 0.0–0.1)
Eosinophils Absolute: 0.1 10*3/uL (ref 0.0–0.7)
Eosinophils Relative: 1 % (ref 0–5)
HEMATOCRIT: 33.6 % — AB (ref 36.0–46.0)
HEMOGLOBIN: 11.1 g/dL — AB (ref 12.0–15.0)
HEP B S AG: NEGATIVE
HIV 1&2 Ab, 4th Generation: NONREACTIVE
Lymphocytes Relative: 17 % (ref 12–46)
Lymphs Abs: 1.3 10*3/uL (ref 0.7–4.0)
MCH: 29.9 pg (ref 26.0–34.0)
MCHC: 33 g/dL (ref 30.0–36.0)
MCV: 90.6 fL (ref 78.0–100.0)
MONOS PCT: 8 % (ref 3–12)
MPV: 12.6 fL — ABNORMAL HIGH (ref 8.6–12.4)
Monocytes Absolute: 0.6 10*3/uL (ref 0.1–1.0)
NEUTROS ABS: 5.5 10*3/uL (ref 1.7–7.7)
NEUTROS PCT: 74 % (ref 43–77)
Platelets: 186 10*3/uL (ref 150–400)
RBC: 3.71 MIL/uL — ABNORMAL LOW (ref 3.87–5.11)
RDW: 13.7 % (ref 11.5–15.5)
RUBELLA: 3.33 {index} — AB (ref <0.90)
Rh Type: POSITIVE
WBC: 7.4 10*3/uL (ref 4.0–10.5)

## 2015-11-21 LAB — PRESCRIPTION MONITORING PROFILE (19 PANEL)
Amphetamine/Meth: NEGATIVE ng/mL
BARBITURATE SCREEN, URINE: NEGATIVE ng/mL
Benzodiazepine Screen, Urine: NEGATIVE ng/mL
Buprenorphine, Urine: NEGATIVE ng/mL
CANNABINOID SCRN UR: NEGATIVE ng/mL
CARISOPRODOL, URINE: NEGATIVE ng/mL
COCAINE METABOLITES: NEGATIVE ng/mL
CREATININE, URINE: 159.93 mg/dL (ref >20.0)
ECSTASY: NEGATIVE ng/mL
FENTANYL URINE: NEGATIVE ng/mL
Meperidine, Ur: NEGATIVE ng/mL
Methadone Screen, Urine: NEGATIVE ng/mL
Methaqualone: NEGATIVE ng/mL
Nitrites, Initial: NEGATIVE ug/mL
OPIATE SCREEN, URINE: NEGATIVE ng/mL
Oxycodone Screen, Ur: NEGATIVE ng/mL
PH URINE, INITIAL: 6.9 pH (ref 4.5–8.9)
PHENCYCLIDINE, UR: NEGATIVE ng/mL
Propoxyphene: NEGATIVE ng/mL
Tapentadol, urine: NEGATIVE ng/mL
Tramadol Scrn, Ur: NEGATIVE ng/mL
Zolpidem, Urine: NEGATIVE ng/mL

## 2015-11-21 LAB — GC/CHLAMYDIA PROBE AMP (~~LOC~~) NOT AT ARMC
CHLAMYDIA, DNA PROBE: NEGATIVE
Neisseria Gonorrhea: NEGATIVE

## 2015-11-22 LAB — CULTURE, OB URINE: Colony Count: 10000

## 2015-11-22 LAB — CYTOLOGY - PAP

## 2015-11-26 ENCOUNTER — Encounter: Payer: Self-pay | Admitting: Advanced Practice Midwife

## 2015-11-26 DIAGNOSIS — O2340 Unspecified infection of urinary tract in pregnancy, unspecified trimester: Secondary | ICD-10-CM

## 2015-11-26 DIAGNOSIS — B951 Streptococcus, group B, as the cause of diseases classified elsewhere: Secondary | ICD-10-CM | POA: Insufficient documentation

## 2015-12-04 ENCOUNTER — Ambulatory Visit (HOSPITAL_COMMUNITY)
Admission: RE | Admit: 2015-12-04 | Discharge: 2015-12-04 | Disposition: A | Discharge status: Home / Self Care | Payer: Medicaid Other | Source: Ambulatory Visit | Attending: Advanced Practice Midwife | Admitting: Advanced Practice Midwife

## 2015-12-04 ENCOUNTER — Other Ambulatory Visit: Payer: Self-pay | Admitting: Advanced Practice Midwife

## 2015-12-04 ENCOUNTER — Encounter (HOSPITAL_COMMUNITY): Payer: Self-pay

## 2015-12-04 DIAGNOSIS — Z3689 Encounter for other specified antenatal screening: Secondary | ICD-10-CM

## 2015-12-04 DIAGNOSIS — Z3A18 18 weeks gestation of pregnancy: Secondary | ICD-10-CM

## 2015-12-04 DIAGNOSIS — Z3A15 15 weeks gestation of pregnancy: Secondary | ICD-10-CM | POA: Diagnosis not present

## 2015-12-04 DIAGNOSIS — Z36 Encounter for antenatal screening of mother: Secondary | ICD-10-CM | POA: Insufficient documentation

## 2015-12-04 DIAGNOSIS — Z3492 Encounter for supervision of normal pregnancy, unspecified, second trimester: Secondary | ICD-10-CM

## 2015-12-04 DIAGNOSIS — Z3482 Encounter for supervision of other normal pregnancy, second trimester: Secondary | ICD-10-CM

## 2015-12-04 DIAGNOSIS — Z1389 Encounter for screening for other disorder: Secondary | ICD-10-CM

## 2015-12-19 ENCOUNTER — Ambulatory Visit (INDEPENDENT_AMBULATORY_CARE_PROVIDER_SITE_OTHER): Payer: Medicaid Other | Admitting: Certified Nurse Midwife

## 2015-12-19 VITALS — BP 130/71 | HR 86 | Wt 140.8 lb

## 2015-12-19 DIAGNOSIS — Z3482 Encounter for supervision of other normal pregnancy, second trimester: Secondary | ICD-10-CM

## 2015-12-19 LAB — POCT URINALYSIS DIP (DEVICE)
Bilirubin Urine: NEGATIVE
Glucose, UA: NEGATIVE mg/dL
Hgb urine dipstick: NEGATIVE
Ketones, ur: NEGATIVE mg/dL
Leukocytes, UA: NEGATIVE
Nitrite: NEGATIVE
Protein, ur: NEGATIVE mg/dL
Specific Gravity, Urine: 1.02 (ref 1.005–1.030)
Urobilinogen, UA: 0.2 mg/dL (ref 0.0–1.0)
pH: 5.5 (ref 5.0–8.0)

## 2015-12-19 NOTE — Patient Instructions (Signed)

## 2015-12-19 NOTE — Progress Notes (Signed)
Subjective:  Holly Oconnor is a 35 y.o. G3P2002 at 2989w5d being seen today for ongoing prenatal care.  She is currently monitored for the following issues for this low-risk pregnancy and has AS (sickle cell trait) (HCC); Environmental allergies; Supervision of normal subsequent pregnancy; and GBS (group B streptococcus) UTI complicating pregnancy on her problem list.  Patient reports no complaints.  Contractions: Not present. Vag. Bleeding: None.  Movement: Present. Denies leaking of fluid.   The following portions of the patient's history were reviewed and updated as appropriate: allergies, current medications, past family history, past medical history, past social history, past surgical history and problem list. Problem list updated.  Objective:   Filed Vitals:   12/19/15 1419  BP: 130/71  Pulse: 86  Weight: 140 lb 12.8 oz (63.866 kg)    Fetal Status: Fetal Heart Rate (bpm): 154   Movement: Present     General:  Alert, oriented and cooperative. Patient is in no acute distress.  Skin: Skin is warm and dry. No rash noted.   Cardiovascular: Normal heart rate noted  Respiratory: Normal respiratory effort, no problems with respiration noted  Abdomen: Soft, gravid, appropriate for gestational age. Pain/Pressure: Absent     Pelvic: Vag. Bleeding: None Vag D/C Character: White   Cervical exam deferred        Extremities: Normal range of motion.  Edema: None  Mental Status: Normal mood and affect. Normal behavior. Normal judgment and thought content.   Urinalysis:      Assessment and Plan:  Pregnancy: G3P2002 at 5289w5d  1. Encounter for supervision of other normal pregnancy in second trimester   Preterm labor symptoms and general obstetric precautions including but not limited to vaginal bleeding, contractions, leaking of fluid and fetal movement were reviewed in detail with the patient. Please refer to After Visit Summary for other counseling recommendations.  Return in about 4 weeks  (around 01/16/2016). Follow up anatomy u/s scheduled  Rhea PinkLori A Dekari Bures, CNM

## 2015-12-19 NOTE — Progress Notes (Signed)
Has discharge that sometimes is itchy.

## 2016-01-02 ENCOUNTER — Ambulatory Visit (HOSPITAL_COMMUNITY)
Admission: RE | Admit: 2016-01-02 | Discharge: 2016-01-02 | Disposition: A | Discharge status: Home / Self Care | Payer: Medicaid Other | Source: Ambulatory Visit | Attending: Certified Nurse Midwife | Admitting: Certified Nurse Midwife

## 2016-01-02 DIAGNOSIS — Z3A19 19 weeks gestation of pregnancy: Secondary | ICD-10-CM | POA: Diagnosis not present

## 2016-01-02 DIAGNOSIS — Z3482 Encounter for supervision of other normal pregnancy, second trimester: Secondary | ICD-10-CM | POA: Insufficient documentation

## 2016-01-02 DIAGNOSIS — Z36 Encounter for antenatal screening of mother: Secondary | ICD-10-CM | POA: Diagnosis not present

## 2016-01-09 ENCOUNTER — Telehealth: Payer: Self-pay | Admitting: *Deleted

## 2016-01-09 NOTE — Telephone Encounter (Signed)
-----   Message from Rhea PinkLori A Clemmons, CNM sent at 01/03/2016  8:25 PM EDT ----- Regarding: Placenta ; Pelvic Rest Please call pt and tell her that she has a posterior marginal previa and needs to be on pelvic rest; bleeding precautions  Thank you

## 2016-01-09 NOTE — Telephone Encounter (Signed)
Attempted to call patient regarding placenta previa, pelvic rest. Patient did not answer, left message for her to return my call in the clinic.

## 2016-01-16 ENCOUNTER — Ambulatory Visit (INDEPENDENT_AMBULATORY_CARE_PROVIDER_SITE_OTHER): Payer: Medicaid Other | Admitting: Obstetrics & Gynecology

## 2016-01-16 VITALS — BP 136/69 | HR 79 | Wt 143.8 lb

## 2016-01-16 DIAGNOSIS — O99012 Anemia complicating pregnancy, second trimester: Secondary | ICD-10-CM | POA: Diagnosis present

## 2016-01-16 DIAGNOSIS — D573 Sickle-cell trait: Secondary | ICD-10-CM

## 2016-01-16 DIAGNOSIS — Z23 Encounter for immunization: Secondary | ICD-10-CM | POA: Diagnosis not present

## 2016-01-16 DIAGNOSIS — Z3482 Encounter for supervision of other normal pregnancy, second trimester: Secondary | ICD-10-CM

## 2016-01-16 LAB — POCT URINALYSIS DIP (DEVICE)
BILIRUBIN URINE: NEGATIVE
GLUCOSE, UA: NEGATIVE mg/dL
Hgb urine dipstick: NEGATIVE
Ketones, ur: NEGATIVE mg/dL
Nitrite: NEGATIVE
Protein, ur: NEGATIVE mg/dL
Specific Gravity, Urine: 1.015 (ref 1.005–1.030)
Urobilinogen, UA: 0.2 mg/dL (ref 0.0–1.0)
pH: 7 (ref 5.0–8.0)

## 2016-01-16 NOTE — Progress Notes (Signed)
Flu vaccine today 

## 2016-01-16 NOTE — Patient Instructions (Addendum)
Second Trimester of Pregnancy The second trimester is from week 13 through week 28, months 4 through 6. The second trimester is often a time when you feel your best. Your body has also adjusted to being pregnant, and you begin to feel better physically. Usually, morning sickness has lessened or quit completely, you may have more energy, and you may have an increase in appetite. The second trimester is also a time when the fetus is growing rapidly. At the end of the sixth month, the fetus is about 9 inches long and weighs about 1 pounds. You will likely begin to feel the baby move (quickening) between 18 and 20 weeks of the pregnancy. BODY CHANGES Your body goes through many changes during pregnancy. The changes vary from woman to woman.   Your weight will continue to increase. You will notice your lower abdomen bulging out.  You may begin to get stretch marks on your hips, abdomen, and breasts.  You may develop headaches that can be relieved by medicines approved by your health care provider.  You may urinate more often because the fetus is pressing on your bladder.  You may develop or continue to have heartburn as a result of your pregnancy.  You may develop constipation because certain hormones are causing the muscles that push waste through your intestines to slow down.  You may develop hemorrhoids or swollen, bulging veins (varicose veins).  You may have back pain because of the weight gain and pregnancy hormones relaxing your joints between the bones in your pelvis and as a result of a shift in weight and the muscles that support your balance.  Your breasts will continue to grow and be tender.  Your gums may bleed and may be sensitive to brushing and flossing.  Dark spots or blotches (chloasma, mask of pregnancy) may develop on your face. This will likely fade after the baby is born.  A dark line from your belly button to the pubic area (linea nigra) may appear. This will likely fade  after the baby is born.  You may have changes in your hair. These can include thickening of your hair, rapid growth, and changes in texture. Some women also have hair loss during or after pregnancy, or hair that feels dry or thin. Your hair will most likely return to normal after your baby is born. WHAT TO EXPECT AT YOUR PRENATAL VISITS During a routine prenatal visit:  You will be weighed to make sure you and the fetus are growing normally.  Your blood pressure will be taken.  Your abdomen will be measured to track your baby's growth.  The fetal heartbeat will be listened to.  Any test results from the previous visit will be discussed. Your health care provider may ask you:  How you are feeling.  If you are feeling the baby move.  If you have had any abnormal symptoms, such as leaking fluid, bleeding, severe headaches, or abdominal cramping.  If you are using any tobacco products, including cigarettes, chewing tobacco, and electronic cigarettes.  If you have any questions. Other tests that may be performed during your second trimester include:  Blood tests that check for:  Low iron levels (anemia).  Gestational diabetes (between 24 and 28 weeks).  Rh antibodies.  Urine tests to check for infections, diabetes, or protein in the urine.  An ultrasound to confirm the proper growth and development of the baby.  An amniocentesis to check for possible genetic problems.  Fetal screens for spina bifida   and Down syndrome.  HIV (human immunodeficiency virus) testing. Routine prenatal testing includes screening for HIV, unless you choose not to have this test. HOME CARE INSTRUCTIONS   Avoid all smoking, herbs, alcohol, and unprescribed drugs. These chemicals affect the formation and growth of the baby.  Do not use any tobacco products, including cigarettes, chewing tobacco, and electronic cigarettes. If you need help quitting, ask your health care provider. You may receive  counseling support and other resources to help you quit.  Follow your health care provider's instructions regarding medicine use. There are medicines that are either safe or unsafe to take during pregnancy.  Exercise only as directed by your health care provider. Experiencing uterine cramps is a good sign to stop exercising.  Continue to eat regular, healthy meals.  Wear a good support bra for breast tenderness.  Do not use hot tubs, steam rooms, or saunas.  Wear your seat belt at all times when driving.  Avoid raw meat, uncooked cheese, cat litter boxes, and soil used by cats. These carry germs that can cause birth defects in the baby.  Take your prenatal vitamins.  Take 1500-2000 mg of calcium daily starting at the 20th week of pregnancy until you deliver your baby.  Try taking a stool softener (if your health care provider approves) if you develop constipation. Eat more high-fiber foods, such as fresh vegetables or fruit and whole grains. Drink plenty of fluids to keep your urine clear or pale yellow.  Take warm sitz baths to soothe any pain or discomfort caused by hemorrhoids. Use hemorrhoid cream if your health care provider approves.  If you develop varicose veins, wear support hose. Elevate your feet for 15 minutes, 3-4 times a day. Limit salt in your diet.  Avoid heavy lifting, wear low heel shoes, and practice good posture.  Rest with your legs elevated if you have leg cramps or low back pain.  Visit your dentist if you have not gone yet during your pregnancy. Use a soft toothbrush to brush your teeth and be gentle when you floss.  A sexual relationship may be continued unless your health care provider directs you otherwise.  Continue to go to all your prenatal visits as directed by your health care provider. SEEK MEDICAL CARE IF:   You have dizziness.  You have mild pelvic cramps, pelvic pressure, or nagging pain in the abdominal area.  You have persistent nausea,  vomiting, or diarrhea.  You have a bad smelling vaginal discharge.  You have pain with urination. SEEK IMMEDIATE MEDICAL CARE IF:   You have a fever.  You are leaking fluid from your vagina.  You have spotting or bleeding from your vagina.  You have severe abdominal cramping or pain.  You have rapid weight gain or loss.  You have shortness of breath with chest pain.  You notice sudden or extreme swelling of your face, hands, ankles, feet, or legs.  You have not felt your baby move in over an hour.  You have severe headaches that do not go away with medicine.  You have vision changes.   This information is not intended to replace advice given to you by your health care provider. Make sure you discuss any questions you have with your health care provider.   Document Released: 08/25/2001 Document Revised: 09/21/2014 Document Reviewed: 11/01/2012 Elsevier Interactive Patient Education 2016 [employer]   Back Pain in Pregnancy Back pain during pregnancy is common. It happens in about half of all pregnancies. It is  important for you and your baby that you remain active during your pregnancy.If you feel that back pain is not allowing you to remain active or sleep well, it is time to see your caregiver. Back pain may be caused by several factors related to changes during your pregnancy.Fortunately, unless you had trouble with your back before your pregnancy, the pain is likely to get better after you deliver. Low back pain usually occurs between the fifth and seventh months of pregnancy. It can, however, happen in the first couple months. Factors that increase the risk of back problems include:   Previous back problems.  Injury to your back.  Having twins or multiple births.  A chronic cough.  Stress.  Job-related repetitive motions.  Muscle or spinal disease in the back.  Family history of back problems, ruptured (herniated) discs, or osteoporosis.  Depression,  anxiety, and panic attacks. CAUSES   When you are pregnant, your body produces a hormone called relaxin. This hormonemakes the ligaments connecting the low back and pubic bones more flexible. This flexibility allows the baby to be delivered more easily. When your ligaments are loose, your muscles need to work harder to support your back. Soreness in your back can come from tired muscles. Soreness can also come from back tissues that are irritated since they are receiving less support.  As the baby grows, it puts pressure on the nerves and blood vessels in your pelvis. This can cause back pain.  As the baby grows and gets heavier during pregnancy, the uterus pushes the stomach muscles forward and changes your center of gravity. This makes your back muscles work harder to maintain good posture. SYMPTOMS  Lumbar pain during pregnancy Lumbar pain during pregnancy usually occurs at or above the waist in the center of the back. There may be pain and numbness that radiates into your leg or foot. This is similar to low back pain experienced by non-pregnant women. It usually increases with sitting for long periods of time, standing, or repetitive lifting. Tenderness may also be present in the muscles along your upper back. Posterior pelvic pain during pregnancy Pain in the back of the pelvis is more common than lumbar pain in pregnancy. It is a deep pain felt in your side at the waistline, or across the tailbone (sacrum), or in both places. You may have pain on one or both sides. This pain can also go into the buttocks and backs of the upper thighs. Pubic and groin pain may also be present. The pain does not quickly resolve with rest, and morning stiffness may also be present. Pelvic pain during pregnancy can be brought on by most activities. A high level of fitness before and during pregnancy may or may not prevent this problem. Labor pain is usually 1 to 2 minutes apart, lasts for about 1 minute, and involves  a bearing down feeling or pressure in your pelvis. However, if you are at term with the pregnancy, constant low back pain can be the beginning of early labor, and you should be aware of this. DIAGNOSIS  X-rays of the back should not be done during the first 12 to 14 weeks of the pregnancy and only when absolutely necessary during the rest of the pregnancy. MRIs do not give off radiation and are safe during pregnancy. MRIs also should only be done when absolutely necessary. HOME CARE INSTRUCTIONS  Exercise as directed by your caregiver. Exercise is the most effective way to prevent or manage back pain. If you  have a back problem, it is especially important to avoid sports that require sudden body movements. Swimming and walking are great activities.  Do not stand in one place for long periods of time.  Do not wear high heels.  Sit in chairs with good posture. Use a pillow on your lower back if necessary. Make sure your head rests over your shoulders and is not hanging forward.  Try sleeping on your side, preferably the left side, with a pillow or two between your legs. If you are sore after a night's rest, your bedmay betoo soft.Try placing a board between your mattress and box spring.  Listen to your body when lifting.If you are experiencing pain, ask for help or try bending yourknees more so you can use your leg muscles rather than your back muscles. Squat down when picking up something from the floor. Do not bend over.  Eat a healthy diet. Try to gain weight within your caregiver's recommendations.  Use heat or cold packs 3 to 4 times a day for 15 minutes to help with the pain.  Only take over-the-counter or prescription medicines for pain, discomfort, or fever as directed by your caregiver. Sudden (acute) back pain  Use bed rest for only the most extreme, acute episodes of back pain. Prolonged bed rest over 48 hours will aggravate your condition.  Ice is very effective for acute  conditions.  Put ice in a plastic bag.  Place a towel between your skin and the bag.  Leave the ice on for 10 to 20 minutes every 2 hours, or as needed.  Using heat packs for 30 minutes prior to activities is also helpful. Continued back pain See your caregiver if you have continued problems. Your caregiver can help or refer you for appropriate physical therapy. With conditioning, most back problems can be avoided. Sometimes, a more serious issue may be the cause of back pain. You should be seen right away if new problems seem to be developing. Your caregiver may recommend:  A maternity girdle.  An elastic sling.  A back brace.  A massage therapist or acupuncture. SEEK MEDICAL CARE IF:   You are not able to do most of your daily activities, even when taking the pain medicine you were given.  You need a referral to a physical therapist or chiropractor.  You want to try acupuncture. SEEK IMMEDIATE MEDICAL CARE IF:  You develop numbness, tingling, weakness, or problems with the use of your arms or legs.  You develop severe back pain that is no longer relieved with medicines.  You have a sudden change in bowel or bladder control.  You have increasing pain in other areas of the body.  You develop shortness of breath, dizziness, or fainting.  You develop nausea, vomiting, or sweating.  You have back pain which is similar to labor pains.  You have back pain along with your water breaking or vaginal bleeding.  You have back pain or numbness that travels down your leg.  Your back pain developed after you fell.  You develop pain on one side of your back. You may have a kidney stone.  You see blood in your urine. You may have a bladder infection or kidney stone.  You have back pain with blisters. You may have shingles. Back pain is fairly common during pregnancy but should not be accepted as just part of the process. Back pain should always be treated as soon as possible.  This will make your pregnancy as pleasant  as possible.   This information is not intended to replace advice given to you by your health care provider. Make sure you discuss any questions you have with your health care provider.   Document Released: 12/09/2005 Document Revised: 11/23/2011 Document Reviewed: 01/20/2011 Elsevier Interactive Patient Education [employer]

## 2016-01-16 NOTE — Progress Notes (Signed)
Subjective:  Holly Oconnor is a 35 y.o. G3P2002 at 7933w5d being seen today for ongoing prenatal care.  She is currently monitored for the following issues for this high-risk pregnancy and has AS (sickle cell trait) (HCC); Environmental allergies; Supervision of normal subsequent pregnancy; and GBS (group B streptococcus) UTI complicating pregnancy on her problem list.  Patient reports no complaints.  Contractions: Not present. Vag. Bleeding: None.  Movement: Present. Denies leaking of fluid.   The following portions of the patient's history were reviewed and updated as appropriate: allergies, current medications, past family history, past medical history, past social history, past surgical history and problem list. Problem list updated.  Objective:   Filed Vitals:   01/16/16 1028  BP: 136/69  Pulse: 79  Weight: 143 lb 12.8 oz (65.227 kg)    Fetal Status: Fetal Heart Rate (bpm): 159   Movement: Present     General:  Alert, oriented and cooperative. Patient is in no acute distress.  Skin: Skin is warm and dry. No rash noted.   Cardiovascular: Normal heart rate noted  Respiratory: Normal respiratory effort, no problems with respiration noted  Abdomen: Soft, gravid, appropriate for gestational age. Pain/Pressure: Present     Pelvic: Vag. Bleeding: None Vag D/C Character: White   Cervical exam deferred        Extremities: Normal range of motion.  Edema: None  Mental Status: Normal mood and affect. Normal behavior. Normal judgment and thought content.   Urinalysis: Urine Protein: Negative Urine Glucose: Negative  Assessment and Plan:  Pregnancy: G3P2002 at 5333w5d  1. Encounter for supervision of other normal pregnancy in second trimester Declines further genetic testing  2. AS (sickle cell trait) (HCC) Child with [place]  Preterm labor symptoms and general obstetric precautions including but not limited to vaginal bleeding, contractions, leaking of fluid and fetal movement were reviewed in  detail with the patient. Please refer to After Visit Summary for other counseling recommendations.  Return in about 4 weeks (around 02/13/2016).   Adam PhenixJames G Gid Schoffstall, MD

## 2016-01-20 NOTE — Telephone Encounter (Signed)
Pt seen on 5/4

## 2016-02-13 ENCOUNTER — Ambulatory Visit (INDEPENDENT_AMBULATORY_CARE_PROVIDER_SITE_OTHER): Payer: Medicaid Other | Admitting: Obstetrics and Gynecology

## 2016-02-13 ENCOUNTER — Encounter: Payer: Self-pay | Admitting: Obstetrics and Gynecology

## 2016-02-13 VITALS — BP 121/64 | HR 69 | Wt 146.5 lb

## 2016-02-13 DIAGNOSIS — Z3482 Encounter for supervision of other normal pregnancy, second trimester: Secondary | ICD-10-CM

## 2016-02-13 DIAGNOSIS — B951 Streptococcus, group B, as the cause of diseases classified elsewhere: Secondary | ICD-10-CM

## 2016-02-13 DIAGNOSIS — O4420 Partial placenta previa NOS or without hemorrhage, unspecified trimester: Secondary | ICD-10-CM | POA: Insufficient documentation

## 2016-02-13 DIAGNOSIS — O2342 Unspecified infection of urinary tract in pregnancy, second trimester: Secondary | ICD-10-CM

## 2016-02-13 LAB — POCT URINALYSIS DIP (DEVICE)
BILIRUBIN URINE: NEGATIVE
Glucose, UA: NEGATIVE mg/dL
HGB URINE DIPSTICK: NEGATIVE
Ketones, ur: NEGATIVE mg/dL
NITRITE: NEGATIVE
PH: 7 (ref 5.0–8.0)
PROTEIN: NEGATIVE mg/dL
Specific Gravity, Urine: 1.015 (ref 1.005–1.030)
UROBILINOGEN UA: 0.2 mg/dL (ref 0.0–1.0)

## 2016-02-13 NOTE — Progress Notes (Signed)
Subjective:  Holly Oconnor is a 35 y.o. G3P2002 at 5938w5d being seen today for ongoing prenatal care.  She is currently monitored for the following issues for this low-risk pregnancy and has AS (sickle cell trait) (HCC); Environmental allergies; Supervision of normal subsequent pregnancy; GBS (group B streptococcus) UTI complicating pregnancy; and Marginal placenta previa on her problem list.  Patient reports vaginal irritation.  Contractions: Not present. Vag. Bleeding: None.  Movement: Present. Denies leaking of fluid.   The following portions of the patient's history were reviewed and updated as appropriate: allergies, current medications, past family history, past medical history, past social history, past surgical history and problem list. Problem list updated.  Objective:   Filed Vitals:   02/13/16 1508  BP: 121/64  Pulse: 69  Weight: 146 lb 8 oz (66.452 kg)    Fetal Status: Fetal Heart Rate (bpm): 150   Movement: Present     General:  Alert, oriented and cooperative. Patient is in no acute distress.  Skin: Skin is warm and dry. No rash noted.   Cardiovascular: Normal heart rate noted  Respiratory: Normal respiratory effort, no problems with respiration noted  Abdomen: Soft, gravid, appropriate for gestational age. Pain/Pressure: Present     Pelvic: Vag. Bleeding: None Vag D/C Character: White   Cervical exam deferred        Extremities: Normal range of motion.  Edema: Trace  Mental Status: Normal mood and affect. Normal behavior. Normal judgment and thought content.   Urinalysis: Urine Protein: Negative Urine Glucose: Negative  Assessment and Plan:  Pregnancy: G3P2002 at 2738w5d  1. Encounter for supervision of other normal pregnancy in second trimester Patient is doing well. Reviewed ultrasound results with the patient and discussed the need for pelvic rest until follow up ultrasound - 1 hour glucola and labs next visit - Wet prep, genital - US MFM OB FOLLOW UP; Future  2.  GBS (group B streptococcus) UTI complicating pregnancy, second trimester Will need prophylaxis in labor  3. Marginal placenta previa Follow up ultrasound ordered  Preterm labor symptoms and general obstetric precautions including but not limited to vaginal bleeding, contractions, leaking of fluid and fetal movement were reviewed in detail with the patient. Please refer to After Visit Summary for other counseling recommendations.  Return in about 3 weeks (around 03/05/2016).   Catalina AntiguaPeggy Donyel Castagnola, MD

## 2016-02-14 ENCOUNTER — Telehealth: Payer: Self-pay | Admitting: *Deleted

## 2016-02-14 LAB — WET PREP, GENITAL: Trich, Wet Prep: NONE SEEN

## 2016-02-14 MED ORDER — FLUCONAZOLE 150 MG PO TABS: 150.0000 mg | ORAL_TABLET | Freq: Once | ORAL | Status: DC

## 2016-02-14 MED ORDER — METRONIDAZOLE 500 MG PO TABS: 500.0000 mg | ORAL_TABLET | Freq: Two times a day (BID) | ORAL | Status: DC

## 2016-02-14 NOTE — Addendum Note (Signed)
Addended by: Catalina AntiguaONSTANT, Beth Spackman on: 02/14/2016 08:40 AM   Modules accepted: Orders

## 2016-02-14 NOTE — Telephone Encounter (Signed)
Called patient and informed her of BV and yeast infection. Prescriptions to her pharmacy Understanding voiced.

## 2016-02-14 NOTE — Telephone Encounter (Signed)
-----   Message from Catalina AntiguaPeggy Constant, MD sent at 02/14/2016  8:40 AM EDT ----- Please inform the patient of BV and yeast infection. Both Rx have been e-prescribed  Thanks  Kinder Morgan EnergyPeggy

## 2016-02-25 ENCOUNTER — Telehealth: Payer: Self-pay | Admitting: General Practice

## 2016-02-25 NOTE — Telephone Encounter (Signed)
Patient called and states she is still not feeling well and her throat feels warm. Patient states the throat spray isn't helping and she wants something else. Recommended to patient she go to urgent care. Patient verbalized understanding & had no questions

## 2016-02-25 NOTE — Telephone Encounter (Signed)
Patient called into front office stating she was up all night with her throat. It is very irritated and feels warm. Recommended to patient chloraseptic throat spray or cough drops. Patient verbalized understanding & had no questions

## 2016-03-04 ENCOUNTER — Ambulatory Visit (INDEPENDENT_AMBULATORY_CARE_PROVIDER_SITE_OTHER): Payer: Medicaid Other | Admitting: Advanced Practice Midwife

## 2016-03-04 VITALS — BP 126/64 | HR 78 | Wt 147.7 lb

## 2016-03-04 DIAGNOSIS — D573 Sickle-cell trait: Secondary | ICD-10-CM

## 2016-03-04 DIAGNOSIS — O26893 Other specified pregnancy related conditions, third trimester: Secondary | ICD-10-CM

## 2016-03-04 DIAGNOSIS — O99013 Anemia complicating pregnancy, third trimester: Secondary | ICD-10-CM | POA: Diagnosis not present

## 2016-03-04 DIAGNOSIS — R12 Heartburn: Secondary | ICD-10-CM

## 2016-03-04 DIAGNOSIS — Z3483 Encounter for supervision of other normal pregnancy, third trimester: Secondary | ICD-10-CM | POA: Diagnosis not present

## 2016-03-04 DIAGNOSIS — Z23 Encounter for immunization: Secondary | ICD-10-CM

## 2016-03-04 LAB — POCT URINALYSIS DIP (DEVICE)
Bilirubin Urine: NEGATIVE
GLUCOSE, UA: NEGATIVE mg/dL
HGB URINE DIPSTICK: NEGATIVE
KETONES UR: NEGATIVE mg/dL
Nitrite: NEGATIVE
Protein, ur: NEGATIVE mg/dL
SPECIFIC GRAVITY, URINE: 1.015 (ref 1.005–1.030)
Urobilinogen, UA: 0.2 mg/dL (ref 0.0–1.0)
pH: 7 (ref 5.0–8.0)

## 2016-03-04 LAB — CBC
HEMATOCRIT: 28.9 % — AB (ref 35.0–45.0)
Hemoglobin: 9.6 g/dL — ABNORMAL LOW (ref 11.7–15.5)
MCH: 28.8 pg (ref 27.0–33.0)
MCHC: 33.2 g/dL (ref 32.0–36.0)
MCV: 86.8 fL (ref 80.0–100.0)
MPV: 13.1 fL — AB (ref 7.5–12.5)
Platelets: 149 10*3/uL (ref 140–400)
RBC: 3.33 MIL/uL — AB (ref 3.80–5.10)
RDW: 14.4 % (ref 11.0–15.0)
WBC: 6.1 10*3/uL (ref 3.8–10.8)

## 2016-03-04 MED ORDER — RANITIDINE HCL 150 MG PO TABS: 150.0000 mg | ORAL_TABLET | Freq: Two times a day (BID) | ORAL | Status: DC

## 2016-03-04 MED ORDER — CONCEPT OB 130-92.4-1 MG PO CAPS: 1.0000 | ORAL_CAPSULE | Freq: Every day | ORAL | Status: DC

## 2016-03-04 MED ORDER — TETANUS-DIPHTH-ACELL PERTUSSIS 5-2.5-18.5 LF-MCG/0.5 IM SUSP
0.5000 mL | Freq: Once | INTRAMUSCULAR | Status: AC
  Administered 2016-03-04: 0.5 mL via INTRAMUSCULAR

## 2016-03-04 NOTE — Progress Notes (Signed)
28 wk labs today 28 wk packet given  

## 2016-03-04 NOTE — Progress Notes (Signed)
Subjective:  Holly Oconnor is a 35 y.o. G3P2002 at 6885w4d being seen today for ongoing prenatal care.  She is currently monitored for the following issues for this low-risk pregnancy and has AS (sickle cell trait) (HCC); Environmental allergies; Supervision of normal subsequent pregnancy; GBS (group B streptococcus) UTI complicating pregnancy; and Marginal placenta previa on her problem list.  Patient reports no complaints.  Contractions: Not present. Vag. Bleeding: None.  Movement: Present. Denies leaking of fluid.   The following portions of the patient's history were reviewed and updated as appropriate: allergies, current medications, past family history, past medical history, past social history, past surgical history and problem list. Problem list updated.  Objective:   Filed Vitals:   03/04/16 0855  BP: 126/64  Pulse: 78  Weight: 147 lb 11.2 oz (66.996 kg)    Fetal Status: Fetal Heart Rate (bpm): 151 Fundal Height: 29 cm Movement: Present     General:  Alert, oriented and cooperative. Patient is in no acute distress.  Skin: Skin is warm and dry. No rash noted.   Cardiovascular: Normal heart rate noted  Respiratory: Normal respiratory effort, no problems with respiration noted  Abdomen: Soft, gravid, appropriate for gestational age. Pain/Pressure: Present     Pelvic: Cervical exam deferred        Extremities: Normal range of motion.  Edema: Trace  Mental Status: Normal mood and affect. Normal behavior. Normal judgment and thought content.   Urinalysis: Urine Protein: Negative Urine Glucose: Negative  Assessment and Plan:  Pregnancy: G3P2002 at 3285w4d  1. Encounter for supervision of other normal pregnancy in third trimester  - Glucose Tolerance, 1 HR (50g) w/o Fasting - RPR - HIV antibody (with reflex) - CBC - Changed prenatal vitamin Rx bc pt reports bitter taste with previous Rx.  Prenat w/o A Vit-FeFum-FePo-FA (CONCEPT OB) 130-92.4-1 MG CAPS; Take 1 capsule by mouth daily.   Dispense: 30 capsule; Refill: 11  2. Heartburn during pregnancy in third trimester, antepartum --Pt reports feeling like a lump in her throat when swallowing, occasional heartburn. Will treat for acid reflux with Zantac 150 mg BID or PRN - ranitidine (ZANTAC) 150 MG tablet; Take 1 tablet (150 mg total) by mouth 2 (two) times daily.  Dispense: 60 tablet; Refill: 0  3. AS (sickle cell trait) (HCC) - Culture, OB Urine  Preterm labor symptoms and general obstetric precautions including but not limited to vaginal bleeding, contractions, leaking of fluid and fetal movement were reviewed in detail with the patient. Please refer to After Visit Summary for other counseling recommendations.  Return in about 2 weeks (around 03/18/2016).   Hurshel PartyLisa A Leftwich-Kirby, CNM

## 2016-03-05 LAB — GLUCOSE TOLERANCE, 1 HOUR (50G) W/O FASTING: GLUCOSE, 1 HR, GESTATIONAL: 90 mg/dL (ref <140)

## 2016-03-05 LAB — HIV ANTIBODY (ROUTINE TESTING W REFLEX): HIV 1&2 Ab, 4th Generation: NONREACTIVE

## 2016-03-05 LAB — RPR

## 2016-03-07 LAB — CULTURE, OB URINE: Colony Count: 50000

## 2016-03-08 ENCOUNTER — Inpatient Hospital Stay (HOSPITAL_COMMUNITY)
Admission: AD | Admit: 2016-03-08 | Discharge: 2016-03-08 | Disposition: A | Discharge status: Home / Self Care | Payer: Medicaid Other | Source: Ambulatory Visit | Attending: Obstetrics & Gynecology | Admitting: Obstetrics & Gynecology

## 2016-03-08 DIAGNOSIS — R51 Headache: Secondary | ICD-10-CM

## 2016-03-08 DIAGNOSIS — Z3A29 29 weeks gestation of pregnancy: Secondary | ICD-10-CM | POA: Diagnosis not present

## 2016-03-08 DIAGNOSIS — O26893 Other specified pregnancy related conditions, third trimester: Secondary | ICD-10-CM

## 2016-03-08 NOTE — MAU Note (Signed)
Patient presents with c/o slight headache that started this morning, did not take any medication, has to go to work today and needs a note for work.

## 2016-03-08 NOTE — MAU Provider Note (Signed)
S: G3P2002 @[redacted]w[redacted]d  pt of [facility] presents to MAU with mild h/a today and reports she called out sick from work and needs a note to return to work.  She reports her pregnancy has been uncomplicated and she is rarely sick and rarely misses work. But, she did not sleep well last night and her husband wants her to stay home and rest today before returning to work tomorrow.  She has not tried any treatments or medicines for her h/a and does not desire treatment in MAU. The h/a is not associated with n/v. It is constant, dull, mild and unchanged since onset this morning.  OB History  Gravida Para Term Preterm AB SAB TAB Ectopic Multiple Living  3 2 2  0 0 0 0 0 0 2    # Outcome Date GA Lbr Len/2nd Weight Sex Delivery Anes PTL Lv  3 Current           2 Term 06/04/13 6066w0d 08:00 / 00:11 7 lb 2.8 oz (3.255 kg) M Vag-Spont None  Y  1 Term 10/07/10 4584w2d  7 lb (3.175 kg) F Vag-Spont EPI  Y      O: BP 132/67 mmHg  Pulse 86  Temp(Src) 98 F (36.7 C) (Oral)  Resp 18  Ht 5\' 3"  (1.6 m)  Wt 147 lb (66.679 kg)  BMI 26.05 kg/m2  LMP 07/31/2015 (Exact Date)  VS reviewed, nursing note reviewed,  Constitutional: well developed, well nourished, no distress HEENT: normocephalic CV: normal rate Pulm/chest wall: normal effort Neuro: alert and oriented x 3 Skin: warm, dry Psych: affect normal  A: Headache in pregnancy  P: D/C home BP wnl today but reviewed preeclampsia precautions Letter for pt to miss work today, return tomorrow Return to MAU as needed for emergencies  LEFTWICH-KIRBY, Jay Kempe, CNM 1:58 PM

## 2016-03-26 ENCOUNTER — Ambulatory Visit (HOSPITAL_COMMUNITY)
Admission: RE | Admit: 2016-03-26 | Discharge: 2016-03-26 | Disposition: A | Discharge status: Home / Self Care | Payer: Medicaid Other | Source: Ambulatory Visit | Attending: Obstetrics and Gynecology | Admitting: Obstetrics and Gynecology

## 2016-03-26 ENCOUNTER — Other Ambulatory Visit: Payer: Self-pay | Admitting: Obstetrics and Gynecology

## 2016-03-26 ENCOUNTER — Encounter (HOSPITAL_COMMUNITY): Payer: Self-pay

## 2016-03-26 VITALS — BP 112/65 | HR 97 | Wt 149.0 lb

## 2016-03-26 DIAGNOSIS — O4403 Placenta previa specified as without hemorrhage, third trimester: Secondary | ICD-10-CM | POA: Diagnosis present

## 2016-03-26 DIAGNOSIS — Z36 Encounter for antenatal screening of mother: Secondary | ICD-10-CM | POA: Diagnosis present

## 2016-03-26 DIAGNOSIS — O99019 Anemia complicating pregnancy, unspecified trimester: Secondary | ICD-10-CM | POA: Diagnosis not present

## 2016-03-26 DIAGNOSIS — Z3A31 31 weeks gestation of pregnancy: Secondary | ICD-10-CM | POA: Diagnosis not present

## 2016-03-26 DIAGNOSIS — O4420 Partial placenta previa NOS or without hemorrhage, unspecified trimester: Secondary | ICD-10-CM

## 2016-03-26 DIAGNOSIS — Z3A32 32 weeks gestation of pregnancy: Secondary | ICD-10-CM

## 2016-03-26 DIAGNOSIS — IMO0002 Reserved for concepts with insufficient information to code with codable children: Secondary | ICD-10-CM

## 2016-03-26 DIAGNOSIS — B951 Streptococcus, group B, as the cause of diseases classified elsewhere: Secondary | ICD-10-CM

## 2016-03-26 DIAGNOSIS — O4402 Placenta previa specified as without hemorrhage, second trimester: Secondary | ICD-10-CM

## 2016-03-26 DIAGNOSIS — O468X3 Other antepartum hemorrhage, third trimester: Secondary | ICD-10-CM | POA: Diagnosis not present

## 2016-03-26 DIAGNOSIS — O2340 Unspecified infection of urinary tract in pregnancy, unspecified trimester: Secondary | ICD-10-CM

## 2016-03-26 DIAGNOSIS — D573 Sickle-cell trait: Secondary | ICD-10-CM | POA: Diagnosis not present

## 2016-03-26 DIAGNOSIS — Z0489 Encounter for examination and observation for other specified reasons: Secondary | ICD-10-CM

## 2016-03-26 DIAGNOSIS — Z3483 Encounter for supervision of other normal pregnancy, third trimester: Secondary | ICD-10-CM

## 2016-03-26 DIAGNOSIS — Z3482 Encounter for supervision of other normal pregnancy, second trimester: Secondary | ICD-10-CM

## 2016-03-30 ENCOUNTER — Ambulatory Visit (INDEPENDENT_AMBULATORY_CARE_PROVIDER_SITE_OTHER): Payer: Medicaid Other | Admitting: Obstetrics and Gynecology

## 2016-03-30 VITALS — BP 131/69 | HR 91 | Wt 148.1 lb

## 2016-03-30 DIAGNOSIS — O99013 Anemia complicating pregnancy, third trimester: Secondary | ICD-10-CM | POA: Diagnosis not present

## 2016-03-30 DIAGNOSIS — D649 Anemia, unspecified: Secondary | ICD-10-CM | POA: Diagnosis not present

## 2016-03-30 DIAGNOSIS — Z3483 Encounter for supervision of other normal pregnancy, third trimester: Secondary | ICD-10-CM

## 2016-03-30 DIAGNOSIS — O99019 Anemia complicating pregnancy, unspecified trimester: Secondary | ICD-10-CM | POA: Insufficient documentation

## 2016-03-30 LAB — CBC
HEMATOCRIT: 28.8 % — AB (ref 35.0–45.0)
HEMOGLOBIN: 9.5 g/dL — AB (ref 11.7–15.5)
MCH: 29 pg (ref 27.0–33.0)
MCHC: 33 g/dL (ref 32.0–36.0)
MCV: 87.8 fL (ref 80.0–100.0)
MPV: 12 fL (ref 7.5–12.5)
Platelets: 132 10*3/uL — ABNORMAL LOW (ref 140–400)
RBC: 3.28 MIL/uL — ABNORMAL LOW (ref 3.80–5.10)
RDW: 15.6 % — ABNORMAL HIGH (ref 11.0–15.0)
WBC: 6.3 10*3/uL (ref 3.8–10.8)

## 2016-03-30 LAB — POCT URINALYSIS DIP (DEVICE)
Bilirubin Urine: NEGATIVE
GLUCOSE, UA: NEGATIVE mg/dL
Ketones, ur: NEGATIVE mg/dL
NITRITE: NEGATIVE
PROTEIN: NEGATIVE mg/dL
SPECIFIC GRAVITY, URINE: 1.02 (ref 1.005–1.030)
UROBILINOGEN UA: 0.2 mg/dL (ref 0.0–1.0)
pH: 7 (ref 5.0–8.0)

## 2016-03-30 NOTE — Progress Notes (Signed)
Subjective:  Holly Oconnor is a 35 y.o. G3P2002 at 3762w2d being seen today for ongoing prenatal care.  She is currently monitored for the following issues for this low-risk pregnancy and has AS (sickle cell trait) (HCC); Environmental allergies; Supervision of normal subsequent pregnancy; GBS (group B streptococcus) UTI complicating pregnancy; and Anemia affecting pregnancy on her problem list.  Patient reports no complaints.  Contractions: Not present. Vag. Bleeding: None.  Movement: Present. Denies leaking of fluid.   The following portions of the patient's history were reviewed and updated as appropriate: allergies, current medications, past family history, past medical history, past social history, past surgical history and problem list. Problem list updated.  Objective:   Filed Vitals:   03/30/16 0759  BP: 131/69  Pulse: 91  Weight: 148 lb 1.6 oz (67.178 kg)    Fetal Status: Fetal Heart Rate (bpm): 150   Movement: Present     General:  Alert, oriented and cooperative. Patient is in no acute distress.  Skin: Skin is warm and dry. No rash noted.   Cardiovascular: Normal heart rate noted  Respiratory: Normal respiratory effort, no problems with respiration noted  Abdomen: Soft, gravid, appropriate for gestational age. Pain/Pressure: Present     Pelvic:  Cervical exam deferred        Extremities: Normal range of motion.  Edema: Trace  Mental Status: Normal mood and affect. Normal behavior. Normal judgment and thought content.   Urinalysis:      Assessment and Plan:  Pregnancy: G3P2002 at 2862w2d  1. Encounter for supervision of other normal pregnancy in third trimester - doing well  2. Anemia affecting pregnancy - cbc with ferritin  Preterm labor symptoms and general obstetric precautions including but not limited to vaginal bleeding, contractions, leaking of fluid and fetal movement were reviewed in detail with the patient. Please refer to After Visit Summary for other counseling  recommendations.    Kathrynn RunningNoah Bedford Leyan Branden, MD

## 2016-03-31 LAB — FERRITIN: Ferritin: 16 ng/mL (ref 10–154)

## 2016-03-31 MED ORDER — FERROUS SULFATE 325 (65 FE) MG PO TABS: 325.0000 mg | ORAL_TABLET | Freq: Two times a day (BID) | ORAL | Status: DC

## 2016-03-31 MED ORDER — DOCUSATE SODIUM 100 MG PO CAPS: 100.0000 mg | ORAL_CAPSULE | Freq: Two times a day (BID) | ORAL | Status: DC | PRN

## 2016-03-31 NOTE — Addendum Note (Signed)
Addended by: Shonna ChockWOUK, Davius Goudeau B on: 03/31/2016 12:17 PM   Modules accepted: Orders

## 2016-04-20 ENCOUNTER — Other Ambulatory Visit (HOSPITAL_COMMUNITY)
Admission: RE | Admit: 2016-04-20 | Discharge: 2016-04-20 | Disposition: A | Discharge status: Home / Self Care | Payer: Medicaid Other | Source: Ambulatory Visit | Attending: Obstetrics & Gynecology | Admitting: Obstetrics & Gynecology

## 2016-04-20 ENCOUNTER — Ambulatory Visit (INDEPENDENT_AMBULATORY_CARE_PROVIDER_SITE_OTHER): Payer: Medicaid Other | Admitting: Obstetrics & Gynecology

## 2016-04-20 VITALS — BP 120/69 | HR 84 | Wt 149.8 lb

## 2016-04-20 DIAGNOSIS — O99019 Anemia complicating pregnancy, unspecified trimester: Secondary | ICD-10-CM

## 2016-04-20 DIAGNOSIS — L298 Other pruritus: Secondary | ICD-10-CM

## 2016-04-20 DIAGNOSIS — N898 Other specified noninflammatory disorders of vagina: Secondary | ICD-10-CM

## 2016-04-20 DIAGNOSIS — Z113 Encounter for screening for infections with a predominantly sexual mode of transmission: Secondary | ICD-10-CM | POA: Insufficient documentation

## 2016-04-20 DIAGNOSIS — O2340 Unspecified infection of urinary tract in pregnancy, unspecified trimester: Secondary | ICD-10-CM

## 2016-04-20 DIAGNOSIS — Z3483 Encounter for supervision of other normal pregnancy, third trimester: Secondary | ICD-10-CM | POA: Diagnosis not present

## 2016-04-20 DIAGNOSIS — O99013 Anemia complicating pregnancy, third trimester: Secondary | ICD-10-CM | POA: Diagnosis not present

## 2016-04-20 DIAGNOSIS — D573 Sickle-cell trait: Secondary | ICD-10-CM

## 2016-04-20 DIAGNOSIS — B951 Streptococcus, group B, as the cause of diseases classified elsewhere: Secondary | ICD-10-CM

## 2016-04-20 LAB — POCT URINALYSIS DIP (DEVICE)
Bilirubin Urine: NEGATIVE
Glucose, UA: NEGATIVE mg/dL
Hgb urine dipstick: NEGATIVE
Ketones, ur: NEGATIVE mg/dL
NITRITE: NEGATIVE
PH: 7 (ref 5.0–8.0)
Protein, ur: NEGATIVE mg/dL
Specific Gravity, Urine: 1.015 (ref 1.005–1.030)
Urobilinogen, UA: 0.2 mg/dL (ref 0.0–1.0)

## 2016-04-20 NOTE — Progress Notes (Signed)
Pt reports yellow/white discharge with itching

## 2016-04-20 NOTE — Progress Notes (Signed)
Subjective:  Holly Oconnor is a 35 y.o. G3P2002 at 9061w2d being seen today for ongoing prenatal care.  She is currently monitored for the following issues for this low-risk pregnancy and has AS (sickle cell trait) (HCC); Environmental allergies; Supervision of normal subsequent pregnancy; GBS (group B streptococcus) UTI complicating pregnancy; and Anemia affecting pregnancy on her problem list.  Patient reports hemorrhoid pain.   .  .   . Denies leaking of fluid. She reports some occasional vaginal itching.  The following portions of the patient's history were reviewed and updated as appropriate: allergies, current medications, past family history, past medical history, past social history, past surgical history and problem list. Problem list updated.  Objective:  There were no vitals filed for this visit.  Fetal Status:           General:  Alert, oriented and cooperative. Patient is in no acute distress.  Skin: Skin is warm and dry. No rash noted.   Cardiovascular: Normal heart rate noted  Respiratory: Normal respiratory effort, no problems with respiration noted  Abdomen: Soft, gravid, appropriate for gestational age.       Pelvic:  Cervical exam performed        Extremities: Normal range of motion.     Mental Status: Normal mood and affect. Normal behavior. Normal judgment and thought content.   Urinalysis: Urine Protein: Negative Urine Glucose: Negative  Assessment and Plan:  Pregnancy: G3P2002 at 4861w2d  1. Encounter for supervision of other normal pregnancy in third trimester - Cervical cultures at next visit  2. GBS (group B streptococcus) UTI complicating pregnancy, unspecified trimester   3. Anemia affecting pregnancy   4. AS (sickle cell trait) (HCC)  5. Vaginal itching- check wet prep   Preterm labor symptoms and general obstetric precautions including but not limited to vaginal bleeding, contractions, leaking of fluid and fetal movement were reviewed in detail with  the patient. Please refer to After Visit Summary for other counseling recommendations.  No Follow-up on file.   Allie BossierMyra C Hector Venne, MD

## 2016-04-20 NOTE — Addendum Note (Signed)
Addended by: Faythe CasaBELLAMY, Analysa Nutting M on: 04/20/2016 09:23 AM   Modules accepted: Orders

## 2016-04-21 ENCOUNTER — Telehealth: Payer: Self-pay | Admitting: *Deleted

## 2016-04-21 LAB — WET PREP, GENITAL: TRICH WET PREP: NONE SEEN

## 2016-04-21 LAB — GC/CHLAMYDIA PROBE AMP (~~LOC~~) NOT AT ARMC
Chlamydia: NEGATIVE
Neisseria Gonorrhea: NEGATIVE

## 2016-04-21 MED ORDER — FLUCONAZOLE 150 MG PO TABS: 150.0000 mg | ORAL_TABLET | Freq: Every day | ORAL | 0 refills | Status: AC

## 2016-04-21 NOTE — Telephone Encounter (Signed)
Per Dr. Marice Potterove order Diflucan 150 mg one dose for patient. Rx sent to pharmacy.

## 2016-04-27 ENCOUNTER — Ambulatory Visit (INDEPENDENT_AMBULATORY_CARE_PROVIDER_SITE_OTHER): Payer: Medicaid Other | Admitting: Family Medicine

## 2016-04-27 VITALS — BP 121/66 | HR 84 | Wt 151.3 lb

## 2016-04-27 DIAGNOSIS — O99013 Anemia complicating pregnancy, third trimester: Secondary | ICD-10-CM

## 2016-04-27 DIAGNOSIS — O2343 Unspecified infection of urinary tract in pregnancy, third trimester: Secondary | ICD-10-CM

## 2016-04-27 DIAGNOSIS — B951 Streptococcus, group B, as the cause of diseases classified elsewhere: Secondary | ICD-10-CM

## 2016-04-27 DIAGNOSIS — Z3483 Encounter for supervision of other normal pregnancy, third trimester: Secondary | ICD-10-CM

## 2016-04-27 DIAGNOSIS — D573 Sickle-cell trait: Secondary | ICD-10-CM

## 2016-04-27 DIAGNOSIS — O99019 Anemia complicating pregnancy, unspecified trimester: Secondary | ICD-10-CM

## 2016-04-27 LAB — POCT URINALYSIS DIP (DEVICE)
BILIRUBIN URINE: NEGATIVE
Glucose, UA: NEGATIVE mg/dL
KETONES UR: NEGATIVE mg/dL
NITRITE: NEGATIVE
PH: 7.5 (ref 5.0–8.0)
Protein, ur: NEGATIVE mg/dL
SPECIFIC GRAVITY, URINE: 1.015 (ref 1.005–1.030)
Urobilinogen, UA: 2 mg/dL — ABNORMAL HIGH (ref 0.0–1.0)

## 2016-04-27 NOTE — Patient Instructions (Signed)
Group B streptococcus (GBS) is a type of bacteria often found in healthy women. GBS is not the same as the bacteria that causes strep throat. You may have GBS in your vagina, rectum, or bladder. GBS does not spread through sexual contact, but it can be passed to a baby during childbirth. This can be dangerous for your baby. It is not dangerous to you and usually does not cause any symptoms. Your health care provider may test you for GBS when your pregnancy is between 35 and 37 weeks. GBS is dangerous only during birth, so there is no need to test for it earlier. It is possible to have GBS during pregnancy and never pass it to your baby. If your test results are positive for GBS, your health care provider may recommend giving you antibiotic medicine during delivery to make sure your baby stays healthy. RISK FACTORS You are more likely to pass GBS to your baby if:   Your water breaks (ruptured membrane) or you go into labor before 37 weeks.  Your water breaks 18 hours before you deliver.  You passed GBS during a previous pregnancy.  You have a urinary tract infection caused by GBS any time during pregnancy.  You have a fever during labor. SYMPTOMS Most women who have GBS do not have any symptoms. If you have a urinary tract infection caused by GBS, you might have frequent or painful urination and fever. Babies who get GBS usually show symptoms within 7 days of birth. Symptoms may include:   Breathing problems.  Heart and blood pressure problems.  Digestive and kidney problems. DIAGNOSIS Routine screening for GBS is recommended for all pregnant women. A health care provider takes a sample of the fluid in your vagina and rectum with a swab. It is then sent to a lab to be checked for GBS. A sample of your urine may also be checked for the bacteria.  TREATMENT If you test positive for GBS, you may need treatment with an antibiotic medicine during labor. As soon as you go into labor, or as soon as  your membranes rupture, you will get the antibiotic medicine through an IV access. You will continue to get the medicine until after you give birth. You do not need antibiotic medicine if you are having a cesarean delivery.If your baby shows signs or symptoms of GBS after birth, your baby can also be treated with an antibiotic medicine. HOME CARE INSTRUCTIONS   Take all antibiotic medicine as prescribed by your health care provider. Only take medicine as directed.   Continue with prenatal visits and care.   Keep all follow-up appointments.  SEEK MEDICAL CARE IF:   You have pain when you urinate.   You have to urinate frequently.   You have a fever.  SEEK IMMEDIATE MEDICAL CARE IF:   Your membranes rupture.  You go into labor.   This information is not intended to replace advice given to you by your health care provider. Make sure you discuss any questions you have with your health care provider.   Document Released: 12/08/2007 Document Revised: 09/05/2013 Document Reviewed: 06/23/2013 Elsevier Interactive Patient Education [employer]2016 Elsevier Inc[employer]  Third Trimester of Pregnancy The third trimester is from week 29 through week 42, months 7 through 9. This trimester is when your unborn baby (fetus) is growing very fast. At the end of the ninth month, the unborn baby is about 20 inches in length. It weighs about 6-10 pounds.  HOME CARE   Avoid  all smoking, herbs, and alcohol. Avoid drugs not approved by your doctor.  Do not use any tobacco products, including cigarettes, chewing tobacco, and electronic cigarettes. If you need help quitting, ask your doctor. You may get counseling or other support to help you quit.  Only take medicine as told by your doctor. Some medicines are safe and some are not during pregnancy.  Exercise only as told by your doctor. Stop exercising if you start having cramps.  Eat regular, healthy meals.  Wear a good support bra if your breasts are  tender.  Do not use hot tubs, steam rooms, or saunas.  Wear your seat belt when driving.  Avoid raw meat, uncooked cheese, and liter boxes and soil used by cats.  Take your prenatal vitamins.  Take 1500-2000 milligrams of calcium daily starting at the 20th week of pregnancy until you deliver your baby.  Try taking medicine that helps you poop (stool softener) as needed, and if your doctor approves. Eat more fiber by eating fresh fruit, vegetables, and whole grains. Drink enough fluids to keep your pee (urine) clear or pale yellow.  Take warm water baths (sitz baths) to soothe pain or discomfort caused by hemorrhoids. Use hemorrhoid cream if your doctor approves.  If you have puffy, bulging veins (varicose veins), wear support hose. Raise (elevate) your feet for 15 minutes, 3-4 times a day. Limit salt in your diet.  Avoid heavy lifting, wear low heels, and sit up straight.  Rest with your legs raised if you have leg cramps or low back pain.  Visit your dentist if you have not gone during your pregnancy. Use a soft toothbrush to brush your teeth. Be gentle when you floss.  You can have sex (intercourse) unless your doctor tells you not to.  Do not travel far distances unless you must. Only do so with your doctor's approval.  Take prenatal classes.  Practice driving to the hospital.  Pack your hospital bag.  Prepare the baby's room.  Go to your doctor visits. GET HELP IF:  You are not sure if you are in labor or if your water has broken.  You are dizzy.  You have mild cramps or pressure in your lower belly (abdominal).  You have a nagging pain in your belly area.  You continue to feel sick to your stomach (nauseous), throw up (vomit), or have watery poop (diarrhea).  You have bad smelling fluid coming from your vagina.  You have pain with peeing (urination). GET HELP RIGHT AWAY IF:   You have a fever.  You are leaking fluid from your vagina.  You are spotting or  bleeding from your vagina.  You have severe belly cramping or pain.  You lose or gain weight rapidly.  You have trouble catching your breath and have chest pain.  You notice sudden or extreme puffiness (swelling) of your face, hands, ankles, feet, or legs.  You have not felt the baby move in over an hour.  You have severe headaches that do not go away with medicine.  You have vision changes.   This information is not intended to replace advice given to you by your health care provider. Make sure you discuss any questions you have with your health care provider.   Document Released: 11/25/2009 Document Revised: 09/21/2014 Document Reviewed: 11/01/2012 Elsevier Interactive Patient Education [employer] Elsevier Inc.

## 2016-04-27 NOTE — Progress Notes (Signed)
Subjective:  Holly Oconnor is a 35 y.o. G3P2002 at 8246w2d being seen today for ongoing prenatal care.  She is currently monitored for the following issues for this low-risk pregnancy and has AS (sickle cell trait) (HCC); Environmental allergies; Supervision of normal subsequent pregnancy; GBS (group B streptococcus) UTI complicating pregnancy; and Anemia affecting pregnancy on her problem list.  Patient reports no complaints.  Contractions: Not present.  .  Movement: Present. Denies leaking of fluid.   The following portions of the patient's history were reviewed and updated as appropriate: allergies, current medications, past family history, past medical history, past social history, past surgical history and problem list. Problem list updated.  Objective:   Vitals:   04/27/16 1338  BP: 121/66  Pulse: 84  Weight: 151 lb 4.8 oz (68.6 kg)    Fetal Status: Fetal Heart Rate (bpm): 145   Movement: Present     General:  Alert, oriented and cooperative. Patient is in no acute distress.  Skin: Skin is warm and dry. No rash noted.   Cardiovascular: Normal heart rate noted  Respiratory: Normal respiratory effort, no problems with respiration noted  Abdomen: Soft, gravid, appropriate for gestational age. Pain/Pressure: Present     Pelvic:  Cervical exam deferred        Extremities: Normal range of motion.     Mental Status: Normal mood and affect. Normal behavior. Normal judgment and thought content.   Urinalysis:      Assessment and Plan:  Pregnancy: G3P2002 at 3546w2d  1. Encounter for supervision of other normal pregnancy in third trimester - Kick counts reviewed  2. GBS (group B streptococcus) UTI complicating pregnancy, third trimester - Reviewed treatment plan with patient, handout given  3. AS (sickle cell trait) (HCC)  4. Anemia affecting pregnancy - Taking iron supplements, discussed indications for blood transfusion  Preterm labor symptoms and general obstetric precautions  including but not limited to vaginal bleeding, contractions, leaking of fluid and fetal movement were reviewed in detail with the patient. Please refer to After Visit Summary for other counseling recommendations.  No Follow-up on file.   [facility] PcElizabeth Woodland JeffersonMumaw, OhioDO

## 2016-05-05 ENCOUNTER — Ambulatory Visit (INDEPENDENT_AMBULATORY_CARE_PROVIDER_SITE_OTHER): Payer: Medicaid Other | Admitting: Advanced Practice Midwife

## 2016-05-05 VITALS — BP 135/74 | HR 81 | Wt 151.0 lb

## 2016-05-05 DIAGNOSIS — N760 Acute vaginitis: Secondary | ICD-10-CM

## 2016-05-05 DIAGNOSIS — O23593 Infection of other part of genital tract in pregnancy, third trimester: Secondary | ICD-10-CM

## 2016-05-05 LAB — POCT URINALYSIS DIP (DEVICE)
Bilirubin Urine: NEGATIVE
GLUCOSE, UA: NEGATIVE mg/dL
Hgb urine dipstick: NEGATIVE
Ketones, ur: NEGATIVE mg/dL
Nitrite: NEGATIVE
PROTEIN: NEGATIVE mg/dL
SPECIFIC GRAVITY, URINE: 1.015 (ref 1.005–1.030)
UROBILINOGEN UA: 0.2 mg/dL (ref 0.0–1.0)
pH: 7 (ref 5.0–8.0)

## 2016-05-05 MED ORDER — TERCONAZOLE 0.4 % VA CREA: 1.0000 | TOPICAL_CREAM | Freq: Every day | VAGINAL | 0 refills | Status: DC

## 2016-05-05 NOTE — Progress Notes (Signed)
Subjective:  Holly Oconnor is a 35 y.o. G3P2002 at 7053w3d being seen today for ongoing prenatal care.  She is currently monitored for the following issues for this low-risk pregnancy and has AS (sickle cell trait) (HCC); Environmental allergies; Supervision of normal subsequent pregnancy; GBS (group B streptococcus) UTI complicating pregnancy; and Anemia affecting pregnancy on her problem list.  Patient reports vaginal irritation. Denies lesions  Contractions: Not present.  .  Movement: Present. Denies leaking of fluid.   The following portions of the patient's history were reviewed and updated as appropriate: allergies, current medications, past family history, past medical history, past social history, past surgical history and problem list. Problem list updated.  Objective:   Vitals:   05/05/16 1527  BP: 135/74  Pulse: 81  Weight: 151 lb (68.5 kg)    Fetal Status: Fetal Heart Rate (bpm): 135   Movement: Present     General:  Alert, oriented and cooperative. Patient is in no acute distress.  Skin: Skin is warm and dry. No rash noted.   Cardiovascular: Normal heart rate noted  Respiratory: Normal respiratory effort, no problems with respiration noted  Abdomen: Soft, gravid, appropriate for gestational age. Pain/Pressure: Present     Pelvic:  Cervical exam deferred        Extremities: Normal range of motion.     Mental Status: Normal mood and affect. Normal behavior. Normal judgment and thought content.   Urinalysis:      Assessment and Plan:  Pregnancy: G3P2002 at 953w3d  1. Vaginitis during pregnancy, third trimester  - terconazole (TERAZOL 7) 0.4 % vaginal cream; Place 1 applicator vaginally at bedtime.  Dispense: 45 g; Refill: 0   Term labor symptoms and general obstetric precautions including but not limited to vaginal bleeding, contractions, leaking of fluid and fetal movement were reviewed in detail with the patient. Please refer to After Visit Summary for other counseling  recommendations.  Return in about 1 week (around 05/12/2016).   Dorathy KinsmanVirginia Chau Savell, CNM

## 2016-05-05 NOTE — Patient Instructions (Signed)
Braxton Hicks Contractions °Contractions of the uterus can occur throughout pregnancy. Contractions are not always a sign that you are in labor.  °WHAT ARE BRAXTON HICKS CONTRACTIONS?  °Contractions that occur before labor are called Braxton Hicks contractions, or false labor. Toward the end of pregnancy (32-34 weeks), these contractions can develop more often and may become more forceful. This is not true labor because these contractions do not result in opening (dilatation) and thinning of the cervix. They are sometimes difficult to tell apart from true labor because these contractions can be forceful and people have different pain tolerances. You should not feel embarrassed if you go to the hospital with false labor. Sometimes, the only way to tell if you are in true labor is for your health care provider to look for changes in the cervix. °If there are no prenatal problems or other health problems associated with the pregnancy, it is completely safe to be sent home with false labor and await the onset of true labor. °HOW CAN YOU TELL THE DIFFERENCE BETWEEN TRUE AND FALSE LABOR? °False Labor °· The contractions of false labor are usually shorter and not as hard as those of true labor.   °· The contractions are usually irregular.   °· The contractions are often felt in the front of the lower abdomen and in the groin.   °· The contractions may go away when you walk around or change positions while lying down.   °· The contractions get weaker and are shorter lasting as time goes on.   °· The contractions do not usually become progressively stronger, regular, and closer together as with true labor.   °True Labor °· Contractions in true labor last 30-70 seconds, become very regular, usually become more intense, and increase in frequency.   °· The contractions do not go away with walking.   °· The discomfort is usually felt in the top of the uterus and spreads to the lower abdomen and low back.   °· True labor can be  determined by your health care provider with an exam. This will show that the cervix is dilating and getting thinner.   °WHAT TO REMEMBER °· Keep up with your usual exercises and follow other instructions given by your health care provider.   °· Take medicines as directed by your health care provider.   °· Keep your regular prenatal appointments.   °· Eat and drink lightly if you think you are going into labor.   °· If Braxton Hicks contractions are making you uncomfortable:   °¨ Change your position from lying down or resting to walking, or from walking to resting.   °¨ Sit and rest in a tub of warm water.   °¨ Drink 2-3 glasses of water. Dehydration may cause these contractions.   °¨ Do slow and deep breathing several times an hour.   °WHEN SHOULD I SEEK IMMEDIATE MEDICAL CARE? °Seek immediate medical care if: °· Your contractions become stronger, more regular, and closer together.   °· You have fluid leaking or gushing from your vagina.   °· You have a fever.   °· You pass blood-tinged mucus.   °· You have vaginal bleeding.   °· You have continuous abdominal pain.   °· You have low back pain that you never had before.   °· You feel your baby's head pushing down and causing pelvic pressure.   °· Your baby is not moving as much as it used to.   °  °This information is not intended to replace advice given to you by your health care provider. Make sure you discuss any questions you have with your health care   provider. °  °Document Released: 08/31/2005 Document Revised: 09/05/2013 Document Reviewed: 06/12/2013 °Elsevier Interactive Patient Education ©2016 Elsevier Inc. ° °

## 2016-05-19 ENCOUNTER — Ambulatory Visit (INDEPENDENT_AMBULATORY_CARE_PROVIDER_SITE_OTHER): Payer: Medicaid Other | Admitting: Certified Nurse Midwife

## 2016-05-19 VITALS — BP 133/75 | HR 92 | Wt 152.0 lb

## 2016-05-19 DIAGNOSIS — O99113 Other diseases of the blood and blood-forming organs and certain disorders involving the immune mechanism complicating pregnancy, third trimester: Secondary | ICD-10-CM | POA: Diagnosis not present

## 2016-05-19 DIAGNOSIS — O2343 Unspecified infection of urinary tract in pregnancy, third trimester: Secondary | ICD-10-CM

## 2016-05-19 DIAGNOSIS — Z3483 Encounter for supervision of other normal pregnancy, third trimester: Secondary | ICD-10-CM | POA: Diagnosis not present

## 2016-05-19 DIAGNOSIS — O99013 Anemia complicating pregnancy, third trimester: Secondary | ICD-10-CM | POA: Diagnosis not present

## 2016-05-19 DIAGNOSIS — O26893 Other specified pregnancy related conditions, third trimester: Secondary | ICD-10-CM

## 2016-05-19 DIAGNOSIS — O99119 Other diseases of the blood and blood-forming organs and certain disorders involving the immune mechanism complicating pregnancy, unspecified trimester: Secondary | ICD-10-CM

## 2016-05-19 DIAGNOSIS — D696 Thrombocytopenia, unspecified: Secondary | ICD-10-CM | POA: Diagnosis not present

## 2016-05-19 DIAGNOSIS — O26899 Other specified pregnancy related conditions, unspecified trimester: Secondary | ICD-10-CM | POA: Insufficient documentation

## 2016-05-19 DIAGNOSIS — Z23 Encounter for immunization: Secondary | ICD-10-CM

## 2016-05-19 DIAGNOSIS — R51 Headache: Secondary | ICD-10-CM | POA: Diagnosis not present

## 2016-05-19 DIAGNOSIS — B951 Streptococcus, group B, as the cause of diseases classified elsewhere: Secondary | ICD-10-CM | POA: Diagnosis not present

## 2016-05-19 DIAGNOSIS — O99019 Anemia complicating pregnancy, unspecified trimester: Secondary | ICD-10-CM

## 2016-05-19 LAB — POCT URINALYSIS DIP (DEVICE)
Bilirubin Urine: NEGATIVE
Glucose, UA: NEGATIVE mg/dL
Hgb urine dipstick: NEGATIVE
KETONES UR: NEGATIVE mg/dL
Nitrite: NEGATIVE
PH: 7 (ref 5.0–8.0)
PROTEIN: NEGATIVE mg/dL
Specific Gravity, Urine: 1.01 (ref 1.005–1.030)
Urobilinogen, UA: 1 mg/dL (ref 0.0–1.0)

## 2016-05-19 MED ORDER — BUTALBITAL-APAP-CAFFEINE 50-325-40 MG PO TABS: 1.0000 | ORAL_TABLET | Freq: Four times a day (QID) | ORAL | 0 refills | Status: DC | PRN

## 2016-05-19 NOTE — Progress Notes (Signed)
Subjective:  Holly Oconnor is a 35 y.o. G3P2002 at 5835w3d being seen today for ongoing prenatal care.  She is currently monitored for the following issues for this low-risk pregnancy and has AS (sickle cell trait) (HCC); Environmental allergies; Supervision of normal subsequent pregnancy; GBS (group B streptococcus) UTI complicating pregnancy; Anemia affecting pregnancy; Gestational thrombocytopenia (HCC); and Headache in pregnancy on her problem list.  Patient reports headache, frontal x4 days, took Tylenol w/little improvement. She denies visual disturbances and epigastric pain. Contractions: Not present. Vag. Bleeding: None.  Movement: Present. Denies leaking of fluid.   The following portions of the patient's history were reviewed and updated as appropriate: allergies, current medications, past family history, past medical history, past social history, past surgical history and problem list. Problem list updated.  Objective:   Vitals:   05/19/16 0933  BP: 133/75  Pulse: 92  Weight: 152 lb (68.9 kg)    Fetal Status: Fetal Heart Rate (bpm): 140   Movement: Present   Fundal height 38cm, vtx  General:  Alert, oriented and cooperative. Patient is in no acute distress.  Skin: Skin is warm and dry. No rash noted.   Cardiovascular: Normal heart rate noted  Respiratory: Normal respiratory effort, no problems with respiration noted  Abdomen: Soft, gravid, appropriate for gestational age. Pain/Pressure: Present     Pelvic: Vag. Bleeding: None Vag D/C Character: White   Cervical exam deferred        Extremities: Normal range of motion.  Edema: None  Mental Status: Normal mood and affect. Normal behavior. Normal judgment and thought content.   Urinalysis: Urine Protein: Negative Urine Glucose: Negative  Assessment and Plan:  Pregnancy: G3P2002 at 1035w3d  1. Encounter for supervision of other normal pregnancy in third trimester - Flu Vaccine QUAD 36+ mos IM  2. GBS (group B streptococcus) UTI  complicating pregnancy, third trimester - abx intrapartum  3. Anemia affecting pregnancy  4. Gestational thrombocytopenia, third trimester [facility](HCC) - recheck on admission  5. Headache in pregnancy, third trimester - Fioricet 1-2 q6 hrs prn #20, no refill - call back if HA not improving w/i 24 hrs  Term labor symptoms and general obstetric precautions including but not limited to vaginal bleeding, contractions, leaking of fluid and fetal movement were reviewed in detail with the patient. Please refer to After Visit Summary for other counseling recommendations.   Follow up in 1 week    Donette LarryMelanie Holy [place], CNM

## 2016-05-22 ENCOUNTER — Inpatient Hospital Stay (HOSPITAL_COMMUNITY)
Admission: AD | Admit: 2016-05-22 | Discharge: 2016-05-24 | DRG: 775 | Disposition: A | Discharge status: Home / Self Care | Payer: Medicaid Other | Source: Ambulatory Visit | Attending: Obstetrics and Gynecology | Admitting: Obstetrics and Gynecology

## 2016-05-22 DIAGNOSIS — O99824 Streptococcus B carrier state complicating childbirth: Secondary | ICD-10-CM | POA: Diagnosis present

## 2016-05-22 DIAGNOSIS — Z3483 Encounter for supervision of other normal pregnancy, third trimester: Secondary | ICD-10-CM | POA: Diagnosis present

## 2016-05-22 DIAGNOSIS — D696 Thrombocytopenia, unspecified: Secondary | ICD-10-CM | POA: Diagnosis present

## 2016-05-22 DIAGNOSIS — Z3A39 39 weeks gestation of pregnancy: Secondary | ICD-10-CM

## 2016-05-22 DIAGNOSIS — O9912 Other diseases of the blood and blood-forming organs and certain disorders involving the immune mechanism complicating childbirth: Secondary | ICD-10-CM | POA: Diagnosis present

## 2016-05-22 DIAGNOSIS — O9902 Anemia complicating childbirth: Secondary | ICD-10-CM | POA: Diagnosis present

## 2016-05-22 DIAGNOSIS — D573 Sickle-cell trait: Secondary | ICD-10-CM | POA: Diagnosis present

## 2016-05-22 MED ORDER — DIPHENHYDRAMINE HCL 25 MG PO CAPS: 25.0000 mg | ORAL_CAPSULE | Freq: Four times a day (QID) | ORAL | Status: DC | PRN

## 2016-05-22 MED ORDER — DIBUCAINE 1 % RE OINT
1.0000 "application " | TOPICAL_OINTMENT | RECTAL | Status: DC | PRN
  Filled 2016-05-22: qty 28.4

## 2016-05-22 MED ORDER — WITCH HAZEL-GLYCERIN EX PADS: 1.0000 "application " | MEDICATED_PAD | CUTANEOUS | Status: DC | PRN

## 2016-05-22 MED ORDER — COCONUT OIL OIL
1.0000 "application " | TOPICAL_OIL | Status: DC | PRN
  Filled 2016-05-22: qty 120

## 2016-05-22 MED ORDER — ZOLPIDEM TARTRATE 5 MG PO TABS: 5.0000 mg | ORAL_TABLET | Freq: Every evening | ORAL | Status: DC | PRN

## 2016-05-22 MED ORDER — SIMETHICONE 80 MG PO CHEW
80.0000 mg | CHEWABLE_TABLET | ORAL | Status: DC | PRN
  Filled 2016-05-22: qty 1

## 2016-05-22 MED ORDER — ONDANSETRON HCL 4 MG/2ML IJ SOLN: 4.0000 mg | INTRAMUSCULAR | Status: DC | PRN

## 2016-05-22 MED ORDER — IBUPROFEN 600 MG PO TABS
600.0000 mg | ORAL_TABLET | Freq: Four times a day (QID) | ORAL | Status: DC
  Administered 2016-05-22 – 2016-05-24 (×8): 600 mg via ORAL
  Filled 2016-05-22 (×8): qty 1

## 2016-05-22 MED ORDER — SENNOSIDES-DOCUSATE SODIUM 8.6-50 MG PO TABS
2.0000 | ORAL_TABLET | ORAL | Status: DC
  Administered 2016-05-23: 2 via ORAL
  Filled 2016-05-22 (×2): qty 2

## 2016-05-22 MED ORDER — ONDANSETRON HCL 4 MG PO TABS: 4.0000 mg | ORAL_TABLET | ORAL | Status: DC | PRN

## 2016-05-22 MED ORDER — ACETAMINOPHEN 325 MG PO TABS
650.0000 mg | ORAL_TABLET | ORAL | Status: DC | PRN
  Administered 2016-05-24: 650 mg via ORAL
  Filled 2016-05-22: qty 2

## 2016-05-22 MED ORDER — PRENATAL MULTIVITAMIN CH
1.0000 | ORAL_TABLET | Freq: Every day | ORAL | Status: DC
  Administered 2016-05-23 – 2016-05-24 (×2): 1 via ORAL
  Filled 2016-05-22 (×3): qty 1

## 2016-05-22 MED ORDER — TETANUS-DIPHTH-ACELL PERTUSSIS 5-2.5-18.5 LF-MCG/0.5 IM SUSP
0.5000 mL | Freq: Once | INTRAMUSCULAR | Status: DC
  Filled 2016-05-22: qty 0.5

## 2016-05-22 MED ORDER — BENZOCAINE-MENTHOL 20-0.5 % EX AERO
1.0000 "application " | INHALATION_SPRAY | CUTANEOUS | Status: DC | PRN
  Administered 2016-05-23: 1 via TOPICAL
  Filled 2016-05-22 (×2): qty 56

## 2016-05-22 NOTE — H&P (Signed)
Mairead Queen SloughJimoh is a 35 y.o. female presenting for labor and leaking . OB History    Gravida Para Term Preterm AB Living   3 2 2  0 0 2   SAB TAB Ectopic Multiple Live Births   0 0 0 0 2     Past Medical History:  Diagnosis Date  . AS (sickle cell trait) (HCC) 12/22/2012   No past surgical history on file. Family History: family history is not on file. Social History:  reports that she has never smoked. She has never used smokeless tobacco. She reports that she does not drink alcohol or use drugs.     Maternal Diabetes: No Genetic Screening: Normal Maternal Ultrasounds/Referrals: Normal Fetal Ultrasounds or other Referrals:  None Maternal Substance Abuse:  No Significant Maternal Medications:  None Significant Maternal Lab Results:  Lab values include: Group B Strep positive Other Comments:  None  Review of Systems  Constitutional: Negative for chills, fever and malaise/fatigue.  Respiratory: Negative for shortness of breath.   Gastrointestinal: Positive for abdominal pain. Negative for diarrhea, nausea and vomiting.  Genitourinary: Negative for dysuria.   Maternal Medical History:  Reason for admission: Rupture of membranes and contractions.  Nausea.  Contractions: Onset was less than 1 hour ago.   Frequency: regular.   Perceived severity is strong.    Fetal activity: Perceived fetal activity is normal.   Last perceived fetal movement was within the past hour.    Prenatal complications: No bleeding, PIH or preterm labor.   Sickle trait GBS urine Thrombocytopenia  Prenatal Complications - Diabetes: none.    Dilation: 7 Effacement (%): 100 Station: -1 Exam by:: Quintella BatonJo Barham rNc Last menstrual period 07/31/2015. Maternal Exam:  Uterine Assessment: Contraction strength is firm.  Contraction frequency is regular.   Abdomen: Patient reports no abdominal tenderness. Fetal presentation: vertex  Introitus: Normal vulva. Vagina is positive for vaginal discharge.   Ferning test: not done.  Nitrazine test: not done. Amniotic fluid character: clear.  Pelvis: adequate for delivery.   Cervix: Cervix evaluated by digital exam.     Fetal Exam Fetal Monitor Review: Mode: ultrasound.   Unable to get her on monitor due to impending delivery     Physical Exam  Constitutional: She is oriented to person, place, and time. She appears well-developed and well-nourished. She appears distressed (with labor).  Cardiovascular: Normal rate.   Respiratory: Effort normal.  GI: Soft. She exhibits no distension. There is no tenderness. There is no rebound and no guarding.  Genitourinary: Vaginal discharge found.  Genitourinary Comments: Dilation: 7 Effacement (%): 100 Station: -1 Presentation: Vertex Exam by:: Quintella BatonJo Barham rNc   Musculoskeletal: Normal range of motion.  Neurological: She is alert and oriented to person, place, and time.  Skin: Skin is warm and dry.  Psychiatric: She has a normal mood and affect.    Prenatal labs: ABO, Rh: B/POS/-- (03/08 1416) Antibody: NEG (03/08 1416) Rubella: 3.33 (03/08 1416) RPR: NON REAC (06/21 0927)  HBsAg: NEGATIVE (03/08 1416)  HIV: NONREACTIVE (06/21 0927)  GBS:     Assessment/Plan: SIUP at 752w6d Active labor  [facility] Routine orders PCN prophylaxis    [facility]ILL[facility]AMS,Pablo Mathurin 05/22/2016, 11:13 PM

## 2016-05-22 NOTE — MAU Note (Signed)
Pt came to MAU by W/C. To RM #7. Helped to undress. STates ruptured membranes on way to hosp. Clear fld. FHR dopplered 126 7/100/-1

## 2016-05-23 ENCOUNTER — Encounter (HOSPITAL_COMMUNITY): Payer: Self-pay | Admitting: *Deleted

## 2016-05-23 LAB — TYPE AND SCREEN
ABO/RH(D): B POS
ANTIBODY SCREEN: NEGATIVE

## 2016-05-23 LAB — CBC
HCT: 30.1 % — ABNORMAL LOW (ref 36.0–46.0)
HEMATOCRIT: 32.2 % — AB (ref 36.0–46.0)
HEMOGLOBIN: 10.4 g/dL — AB (ref 12.0–15.0)
HEMOGLOBIN: 11.4 g/dL — AB (ref 12.0–15.0)
MCH: 30.3 pg (ref 26.0–34.0)
MCH: 30.9 pg (ref 26.0–34.0)
MCHC: 34.6 g/dL (ref 30.0–36.0)
MCHC: 35.4 g/dL (ref 30.0–36.0)
MCV: 87.3 fL (ref 78.0–100.0)
MCV: 87.8 fL (ref 78.0–100.0)
PLATELETS: 133 10*3/uL — AB (ref 150–400)
Platelets: 137 10*3/uL — ABNORMAL LOW (ref 150–400)
RBC: 3.43 MIL/uL — AB (ref 3.87–5.11)
RBC: 3.69 MIL/uL — AB (ref 3.87–5.11)
RDW: 15.1 % (ref 11.5–15.5)
RDW: 15.3 % (ref 11.5–15.5)
WBC: 6.5 10*3/uL (ref 4.0–10.5)
WBC: 9.7 10*3/uL (ref 4.0–10.5)

## 2016-05-23 LAB — RPR: RPR: NONREACTIVE

## 2016-05-23 LAB — ABO/RH: ABO/RH(D): B POS

## 2016-05-23 MED ORDER — OXYTOCIN 10 UNIT/ML IJ SOLN
10.0000 [IU] | Freq: Once | INTRAMUSCULAR | Status: AC
  Administered 2016-05-23: 10 [IU] via INTRAMUSCULAR

## 2016-05-23 MED ORDER — OXYTOCIN 10 UNIT/ML IJ SOLN
INTRAMUSCULAR | Status: AC
  Administered 2016-05-23: 10 [IU] via INTRAMUSCULAR
  Filled 2016-05-23: qty 1

## 2016-05-23 NOTE — MAU Note (Signed)
Pt arrived via car very uncomfortable. initial  SVE 7/100/0.SROM about 10 min before arrival.

## 2016-05-23 NOTE — Lactation Note (Signed)
This note was copied from a baby's chart. Lactation Consultation Note  Patient Name: Holly Oconnor Today's Date: 05/23/2016 Reason for consult: Initial assessment  Visited with Mom, baby 5212 hrs old.  This is her 3rd baby, BF first 2 for a year.  Baby swaddled and sleeping in crib.  Baby born at 9068w6d weighing 8 lbs 0.2oz, in MAU.  Baby needed hood O2 for an hour, but has transitioned well to room air.  Mom +GBS, no Rx so will be observed for 48 hrs.  Encouraged Mom to keep baby skin to skin, and feed him often on cue.  Offered to unwrap and assist with a feeding, but Mom declined saying baby is sleeping and she is very tired.  Asked her to call for assistance as needed with latching.  Formula in room, as Mom stated she doesn't have much milk.  Demonstrated manual breast expression, colostrum easily expressed.  Goal of 8-12 feedings per 24 hrs. Brochure left in room.  Explained about IP and OP Lactation services available to her. To call for help prn, and LC to follow up in am.  Consult Status Consult Status: Follow-up Date: 05/24/16 Follow-up type: In-patient    Holly Oconnor, Holly Oconnor 05/23/2016, 11:38 AM

## 2016-05-23 NOTE — Progress Notes (Signed)
POSTPARTUM PROGRESS NOTE  Post Partum Day 1 Subjective:  Azalia Dorn is a 35 y.o. [id number] 837w6d s/p SVD.  No acute events overnight.  Pt denies problems with ambulating, voiding or po intake.  She denies nausea or vomiting.  Pain is moderately controlled.  She has had flatus. She has not had bowel movement.  Lochia Small.   Objective: Blood pressure 139/80, pulse 75, temperature 98.6 F (37 C), temperature source Oral, resp. rate 18, last menstrual period 07/31/2015, unknown if currently breastfeeding.  Physical Exam:  General: alert, cooperative and no distress Lochia:normal flow Chest: no respiratory distress Heart:regular rate, distal pulses intact Abdomen: soft, nontender,  Uterine Fundus: firm, appropriately tender DVT Evaluation: No calf swelling or tenderness Extremities: no edema   Recent Labs  05/22/16 2349 05/23/16 0539  HGB 11.4* 10.4*  HCT 32.2* 30.1*    Assessment/Plan:  ASSESSMENT: Alli Soja is a 35 y.o. [id number] 6437w6d s/p svd  Plan for discharge tomorrow and Breastfeeding   LOS: 1 day   Les Pouicholas SchenkMD 05/23/2016, 9:15 AM

## 2016-05-24 MED ORDER — IBUPROFEN 600 MG PO TABS: 600.0000 mg | ORAL_TABLET | Freq: Four times a day (QID) | ORAL | 0 refills | Status: DC

## 2016-05-24 NOTE — Discharge Summary (Signed)
OB Discharge Summary     Patient Name: Holly Oconnor DOB: [birth year] MRN: [id number]  Date of admission: 05/22/2016 Delivering MD: Aviva SignsWILLIAMS, MARIE L   Date of discharge: 05/24/2016  Admitting diagnosis: 39.6w labor Intrauterine pregnancy: 5741w6d     Secondary diagnosis:  Active Problems:   Normal vaginal delivery   NSVD (normal spontaneous vaginal delivery)  Additional problems: None     Discharge diagnosis: Term Pregnancy Delivered                                                                                                Post partum procedures:None  Augmentation: None  Complications: None  Hospital course:  Onset of Labor With Vaginal Delivery     35 y.o. yo G3P3003 at 3841w6d was admitted in Active Labor on 05/22/2016, precipitously delivered in the MAU shortly after arrival. Patient had an uncomplicated labor course as follows:  Membrane Rupture Time/Date: 10:30 PM ,05/22/2016   Intrapartum Procedures: Episiotomy: None [1]                                         Lacerations:  None [1]  Patient had a delivery of a Viable infant. 05/22/2016  Information for the patient's newborn:  Rondel JumboJimoh, Boy Darlin [[id number]][[id number]]  Delivery Method: Vaginal, Spontaneous Delivery (Filed from Delivery Summary)    Pateint had an uncomplicated postpartum course.  She is ambulating, tolerating a regular diet, passing flatus, and urinating well. Patient is discharged home in stable condition on 05/24/16.    Physical exam Vitals:   05/23/16 0630 05/23/16 1159 05/23/16 1900 05/24/16 0520  BP: 139/80 136/83 126/84 (!) 141/88  Pulse: 75 78 64 66  Resp: 18 18 18 20   Temp: 98.6 F (37 C) 98.3 F (36.8 C) 99.1 F (37.3 C) 98.1 F (36.7 C)  TempSrc: Oral Oral Oral   SpO2:  97%     General: alert, cooperative and no distress Lochia: appropriate Uterine Fundus: firm Incision: N/A DVT Evaluation: No evidence of DVT seen on physical exam. Negative Homan's sign. No cords or calf tenderness. Labs: Lab  Results  Component Value Date   WBC 9.7 05/23/2016   HGB 10.4 (L) 05/23/2016   HCT 30.1 (L) 05/23/2016   MCV 87.8 05/23/2016   PLT 133 (L) 05/23/2016   No flowsheet data found.  Discharge instruction: per After Visit Summary and "Baby and Me Booklet".  After visit meds:    Medication List    STOP taking these medications   acetaminophen 325 MG tablet Commonly known as:  TYLENOL   butalbital-acetaminophen-caffeine 50-325-40 MG tablet Commonly known as:  FIORICET   terconazole 0.4 % vaginal cream Commonly known as:  TERAZOL 7     TAKE these medications   CONCEPT OB 130-92.4-1 MG Caps Take 1 capsule by mouth daily.   ferrous sulfate 325 (65 FE) MG tablet Commonly known as:  FERROUSUL Take 1 tablet (325 mg total) by mouth 2 (two) times daily.   ibuprofen 600 MG tablet Commonly known as:  ADVIL,MOTRIN Take 1 tablet (600 mg total) by mouth every 6 (six) hours.       Diet: routine diet  Activity: Advance as tolerated. Pelvic rest for 6 weeks.   Outpatient follow up:6 weeks Follow up Appt:No future appointments. Follow up Visit:No Follow-up on file.  Postpartum contraception: Undecided, desires a LARC  Newborn Data: Live born female  Birth Weight: 8 lb 0.2 oz (3635 g) APGAR: 9, 9  Baby Feeding: Breast Disposition:home with mother   05/24/2016 Jen Mow, DO

## 2016-05-24 NOTE — Discharge Instructions (Signed)
Postpartum Care After Vaginal Delivery °After you deliver your newborn (postpartum period), the usual stay in the hospital is 24-72 hours. If there were problems with your labor or delivery, or if you have other medical problems, you might be in the hospital longer.  °While you are in the hospital, you will receive help and instructions on how to care for yourself and your newborn during the postpartum period.  °While you are in the hospital: °· Be sure to tell your nurses if you have pain or discomfort, as well as where you feel the pain and what makes the pain worse. °· If you had an incision made near your vagina (episiotomy) or if you had some tearing during delivery, the nurses may put ice packs on your episiotomy or tear. The ice packs may help to reduce the pain and swelling. °· If you are breastfeeding, you may feel uncomfortable contractions of your uterus for a couple of weeks. This is normal. The contractions help your uterus get back to normal size. °· It is normal to have some bleeding after delivery. °¨ For the first 1-3 days after delivery, the flow is red and the amount may be similar to a period. °¨ It is common for the flow to start and stop. °¨ In the first few days, you may pass some small clots. Let your nurses know if you begin to pass large clots or your flow increases. °¨ Do not  flush blood clots down the toilet before having the nurse look at them. °¨ During the next 3-10 days after delivery, your flow should become more watery and pink or brown-tinged in color. °¨ Ten to fourteen days after delivery, your flow should be a small amount of yellowish-white discharge. °¨ The amount of your flow will decrease over the first few weeks after delivery. Your flow may stop in 6-8 weeks. Most women have had their flow stop by 12 weeks after delivery. °· You should change your sanitary pads frequently. °· Wash your hands thoroughly with soap and water for at least 20 seconds after changing pads, using  the toilet, or before holding or feeding your newborn. °· You should feel like you need to empty your bladder within the first 6-8 hours after delivery. °· In case you become weak, lightheaded, or faint, call your nurse before you get out of bed for the first time and before you take a shower for the first time. °· Within the first few days after delivery, your breasts may begin to feel tender and full. This is called engorgement. Breast tenderness usually goes away within 48-72 hours after engorgement occurs. You may also notice milk leaking from your breasts. If you are not breastfeeding, do not stimulate your breasts. Breast stimulation can make your breasts produce more milk. °· Spending as much time as possible with your newborn is very important. During this time, you and your newborn can feel close and get to know each other. Having your newborn stay in your room (rooming in) will help to strengthen the bond with your newborn.  It will give you time to get to know your newborn and become comfortable caring for your newborn. °· Your hormones change after delivery. Sometimes the hormone changes can temporarily cause you to feel sad or tearful. These feelings should not last more than a few days. If these feelings last longer than that, you should talk to your caregiver. °· If desired, talk to your caregiver about methods of family planning or contraception. °·   Talk to your caregiver about immunizations. Your caregiver may want you to have the following immunizations before leaving the hospital:  Tetanus, diphtheria, and pertussis (Tdap) or tetanus and diphtheria (Td) immunization. It is very important that you and your family (including grandparents) or others caring for your newborn are up-to-date with the Tdap or Td immunizations. The Tdap or Td immunization can help protect your newborn from getting ill.  Rubella immunization.  Varicella (chickenpox) immunization.  Influenza immunization. You should  receive this annual immunization if you did not receive the immunization during your pregnancy.   This information is not intended to replace advice given to you by your health care provider. Make sure you discuss any questions you have with your health care provider.   Document Released: 06/28/2007 Document Revised: 05/25/2012 Document Reviewed: 04/27/2012 Elsevier Interactive Patient Education 2016 [employer].   Etonogestrel implant What is this medicine? ETONOGESTREL (et oh noe JES trel) is a contraceptive (birth control) device. It is used to prevent pregnancy. It can be used for up to 3 years. This medicine may be used for other purposes; ask your health care provider or pharmacist if you have questions. What should I tell my health care provider before I take this medicine? They need to know if you have any of these conditions: -abnormal vaginal bleeding -blood vessel disease or blood clots -cancer of the breast, cervix, or liver -depression -diabetes -gallbladder disease -headaches -heart disease or recent heart attack -high blood pressure -high cholesterol -kidney disease -liver disease -renal disease -seizures -tobacco smoker -an unusual or allergic reaction to etonogestrel, other hormones, anesthetics or antiseptics, medicines, foods, dyes, or preservatives -pregnant or trying to get pregnant -breast-feeding How should I use this medicine? This device is inserted just under the skin on the inner side of your upper arm by a health care professional. Talk to your pediatrician regarding the use of this medicine in children. Special care may be needed. Overdosage: If you think you have taken too much of this medicine contact a poison control center or emergency room at once. NOTE: This medicine is only for you. Do not share this medicine with others. What if I miss a dose? This does not apply. What may interact with this medicine? Do not take this medicine with any of  the following medications: -amprenavir -bosentan -fosamprenavir This medicine may also interact with the following medications: -barbiturate medicines for inducing sleep or treating seizures -certain medicines for fungal infections like ketoconazole and itraconazole -griseofulvin -medicines to treat seizures like carbamazepine, felbamate, oxcarbazepine, phenytoin, topiramate -modafinil -phenylbutazone -rifampin -some medicines to treat HIV infection like atazanavir, indinavir, lopinavir, nelfinavir, tipranavir, ritonavir -St. John's wort This list may not describe all possible interactions. Give your health care provider a list of all the medicines, herbs, non-prescription drugs, or dietary supplements you use. Also tell them if you smoke, drink alcohol, or use illegal drugs. Some items may interact with your medicine. What should I watch for while using this medicine? This product does not protect you against HIV infection (AIDS) or other sexually transmitted diseases. You should be able to feel the implant by pressing your fingertips over the skin where it was inserted. Contact your doctor if you cannot feel the implant, and use a non-hormonal birth control method (such as condoms) until your doctor confirms that the implant is in place. If you feel that the implant may have broken or become bent while in your arm, contact your healthcare provider. What side effects may I notice  from receiving this medicine? Side effects that you should report to your doctor or health care professional as soon as possible: -allergic reactions like skin rash, itching or hives, swelling of the face, lips, or tongue -breast lumps -changes in emotions or moods -depressed mood -heavy or prolonged menstrual bleeding -pain, irritation, swelling, or bruising at the insertion site -scar at site of insertion -signs of infection at the insertion site such as fever, and skin redness, pain or discharge -signs of  pregnancy -signs and symptoms of a blood clot such as breathing problems; changes in vision; chest pain; severe, sudden headache; pain, swelling, warmth in the leg; trouble speaking; sudden numbness or weakness of the face, arm or leg -signs and symptoms of liver injury like dark yellow or brown urine; general ill feeling or flu-like symptoms; light-colored stools; loss of appetite; nausea; right upper belly pain; unusually weak or tired; yellowing of the eyes or skin -unusual vaginal bleeding, discharge -signs and symptoms of a stroke like changes in vision; confusion; trouble speaking or understanding; severe headaches; sudden numbness or weakness of the face, arm or leg; trouble walking; dizziness; loss of balance or coordination Side effects that usually do not require medical attention (Report these to your doctor or health care professional if they continue or are bothersome.): -acne -back pain -breast pain -changes in weight -dizziness -general ill feeling or flu-like symptoms -headache -irregular menstrual bleeding -nausea -sore throat -vaginal irritation or inflammation This list may not describe all possible side effects. Call your doctor for medical advice about side effects. You may report side effects to FDA at 1-800-FDA-1088. Where should I keep my medicine? This drug is given in a hospital or clinic and will not be stored at home. NOTE: This sheet is a summary. It may not cover all possible information. If you have questions about this medicine, talk to your doctor, pharmacist, or health care provider.    2016, Elsevier/Gold Standard. (2014-06-15 14:07:06)   Intrauterine Device Information An intrauterine device (IUD) is inserted into your uterus to prevent pregnancy. There are two types of IUDs available:   Copper IUD--This type of IUD is wrapped in copper wire and is placed inside the uterus. Copper makes the uterus and fallopian tubes produce a fluid that kills sperm.  The copper IUD can stay in place for 10 years.  Hormone IUD--This type of IUD contains the hormone progestin (synthetic progesterone). The hormone thickens the cervical mucus and prevents sperm from entering the uterus. It also thins the uterine lining to prevent implantation of a fertilized egg. The hormone can weaken or kill the sperm that get into the uterus. One type of hormone IUD can stay in place for 5 years, and another type can stay in place for 3 years. Your health care provider will make sure you are a good candidate for a contraceptive IUD. Discuss with your health care provider the possible side effects.  ADVANTAGES OF AN INTRAUTERINE DEVICE  IUDs are highly effective, reversible, long acting, and low maintenance.   There are no estrogen-related side effects.   An IUD can be used when breastfeeding.   IUDs are not associated with weight gain.   The copper IUD works immediately after insertion.   The hormone IUD works right away if inserted within 7 days of your period starting. You will need to use a backup method of birth control for 7 days if the hormone IUD is inserted at any other time in your cycle.  The copper IUD does  not interfere with your female hormones.   The hormone IUD can make heavy menstrual periods lighter and decrease cramping.   The hormone IUD can be used for 3 or 5 years.   The copper IUD can be used for 10 years. DISADVANTAGES OF AN INTRAUTERINE DEVICE  The hormone IUD can be associated with irregular bleeding patterns.   The copper IUD can make your menstrual flow heavier and more painful.   You may experience cramping and vaginal bleeding after insertion.    This information is not intended to replace advice given to you by your health care provider. Make sure you discuss any questions you have with your health care provider.   Document Released: 08/04/2004 Document Revised: 05/03/2013 Document Reviewed: 02/19/2013 Elsevier  Interactive Patient Education [employer]2016 Elsevier Inc.

## 2016-05-24 NOTE — Discharge Summary (Signed)
       OB Discharge Summary  Patient Name: Holly Oconnor DOB: 1981-03-18 MRN: [id number]  Date of admission: 05/22/2016 Delivering MD: Aviva SignsWILLIAMS, Andora Krull L   Date of discharge: 05/24/2016  Admitting diagnosis: 39.6w labor Intrauterine pregnancy: 8966w6d     Secondary diagnosis:Active Problems:   Normal vaginal delivery   NSVD (normal spontaneous vaginal delivery)  Additional problems:     Discharge diagnosis: Term Pregnancy Delivered                                                                     Post partum procedures:n/a  Augmentation: Pitocin  Complications: None  Hospital course:  Onset of Labor With Vaginal Delivery     35 y.o. yo G3P3003 at 2766w6d was admitted in Active Labor on 05/22/2016. Patient had an uncomplicated labor course as follows:  Membrane Rupture Time/Date: 10:30 PM ,05/22/2016   Intrapartum Procedures: Episiotomy: None [1]                                         Lacerations:  None [1]  Patient had a delivery of a Viable infant. 05/22/2016  Information for the patient's newborn:  Rondel JumboJimoh, Boy Baylei [[id number]][[id number]]  Delivery Method: Vaginal, Spontaneous Delivery (Filed from Delivery Summary)    Pateint had an uncomplicated postpartum course.  She is ambulating, tolerating a regular diet, passing flatus, and urinating well. Patient is discharged home in stable condition on 05/24/16.    Physical exam Vitals:   05/23/16 0630 05/23/16 1159 05/23/16 1900 05/24/16 0520  BP: 139/80 136/83 126/84 (!) 141/88  Pulse: 75 78 64 66  Resp: 18 18 18 20   Temp: 98.6 F (37 C) 98.3 F (36.8 C) 99.1 F (37.3 C) 98.1 F (36.7 C)  TempSrc: Oral Oral Oral   SpO2:  97%     General: alert, cooperative and no distress  Lochia: appropriate Uterine Fundus: firm Incision: N/A DVT Evaluation: No evidence of DVT seen on physical exam. Negative Homan's sign. No cords or calf tenderness. No significant calf/ankle edema. Labs: Lab Results  Component Value Date   WBC 9.7 05/23/2016    HGB 10.4 (L) 05/23/2016   HCT 30.1 (L) 05/23/2016   MCV 87.8 05/23/2016   PLT 133 (L) 05/23/2016   No flowsheet data found.  Discharge instruction: per After Visit Summary and "Baby and Me Booklet".    Diet: routine diet  Activity: Advance as tolerated. Pelvic rest for 6 weeks.   Outpatient follow up:6 weeks Follow up Appt:No future appointments. Follow up visit: No Follow-up on file.  Postpartum contraception: Undecided  Newborn Data: Live born female  Birth Weight: 8 lb 0.2 oz (3635 g) APGAR: 9, 9  Baby Feeding: Breast Disposition:nursery   05/24/2016 Renetta ChalkAshley Ellis, Student-MidWife

## 2016-05-25 ENCOUNTER — Ambulatory Visit: Payer: Self-pay

## 2016-05-25 NOTE — Lactation Note (Signed)
This note was copied from a baby's chart. Lactation Consultation Note: Experienced BF mom has baby latched to breast when I went into room. Reports baby has been latching well but did give some formula through the night because baby was still crying after nursing. Reports breasts are feeling some pain today. Encouraged frequent nursing and not using formula any more since she is making more milk. Has hand pump in room -reviewed setup, use and cleaning of pump pieces. Discussed engorgement prevention and treatment. No questions at present. To call prn  Patient Name: Holly Oconnor's Date: 05/25/2016 Reason for consult: Follow-up assessment   Maternal Data Formula Feeding for Exclusion: No Has patient been taught Hand Expression?: Yes Does the patient have breastfeeding experience prior to this delivery?: Yes  Feeding Feeding Type: Breast Fed Length of feed: 20 min  LATCH Score/Interventions Latch: Grasps breast easily, tongue down, lips flanged, rhythmical sucking.  Audible Swallowing: A few with stimulation  Type of Nipple: Everted at rest and after stimulation  Comfort (Breast/Nipple): Soft / non-tender Intervention(s):  (cocunut oil)     Hold (Positioning): No assistance needed to correctly position infant at breast.  LATCH Score: 9  Lactation Tools Discussed/Used Pump Review: Setup, frequency, and cleaning;Milk Storage Initiated by:: Dw Date initiated:: 05/25/16   Consult Status Consult Status: Complete    Pamelia HoitWeeks, Landen Knoedler D 05/25/2016, 10:59 AM

## 2016-05-27 ENCOUNTER — Encounter: Payer: Medicaid Other | Admitting: Family Medicine

## 2016-06-23 ENCOUNTER — Inpatient Hospital Stay (HOSPITAL_COMMUNITY)
Admission: AD | Admit: 2016-06-23 | Discharge: 2016-06-25 | DRG: 776 | Disposition: A | Discharge status: Home / Self Care | Payer: Medicaid Other | Source: Ambulatory Visit | Attending: Obstetrics and Gynecology | Admitting: Obstetrics and Gynecology

## 2016-06-23 ENCOUNTER — Encounter: Payer: Self-pay | Admitting: Student

## 2016-06-23 ENCOUNTER — Ambulatory Visit (INDEPENDENT_AMBULATORY_CARE_PROVIDER_SITE_OTHER): Payer: Medicaid Other | Admitting: Student

## 2016-06-23 DIAGNOSIS — O1495 Unspecified pre-eclampsia, complicating the puerperium: Principal | ICD-10-CM | POA: Diagnosis present

## 2016-06-23 DIAGNOSIS — I10 Essential (primary) hypertension: Secondary | ICD-10-CM | POA: Diagnosis present

## 2016-06-23 DIAGNOSIS — O165 Unspecified maternal hypertension, complicating the puerperium: Secondary | ICD-10-CM

## 2016-06-23 LAB — COMPREHENSIVE METABOLIC PANEL
ALBUMIN: 4.1 g/dL (ref 3.5–5.0)
ALK PHOS: 60 U/L (ref 38–126)
ALT: 29 U/L (ref 14–54)
ALT: 31 U/L (ref 14–54)
ANION GAP: 10 (ref 5–15)
AST: 27 U/L (ref 15–41)
AST: 28 U/L (ref 15–41)
Albumin: 4.4 g/dL (ref 3.5–5.0)
Alkaline Phosphatase: 66 U/L (ref 38–126)
Anion gap: 7 (ref 5–15)
BILIRUBIN TOTAL: 1.2 mg/dL (ref 0.3–1.2)
BUN: 14 mg/dL (ref 6–20)
BUN: 10 mg/dL (ref 6–20)
CALCIUM: 9.4 mg/dL (ref 8.9–10.3)
CHLORIDE: 105 mmol/L (ref 101–111)
CO2: 27 mmol/L (ref 22–32)
CO2: 24 mmol/L (ref 22–32)
CREATININE: 0.71 mg/dL (ref 0.44–1.00)
Calcium: 8.3 mg/dL — ABNORMAL LOW (ref 8.9–10.3)
Chloride: 104 mmol/L (ref 101–111)
Creatinine, Ser: 0.51 mg/dL (ref 0.44–1.00)
GFR calc Af Amer: >60 mL/min (ref >60)
GFR calc non Af Amer: >60 mL/min (ref >60)
GLUCOSE: 99 mg/dL (ref 65–99)
Glucose, Bld: 105 mg/dL — ABNORMAL HIGH (ref 65–99)
POTASSIUM: 3.4 mmol/L — AB (ref 3.5–5.1)
Potassium: 3.9 mmol/L (ref 3.5–5.1)
SODIUM: 139 mmol/L (ref 135–145)
Sodium: 138 mmol/L (ref 135–145)
TOTAL PROTEIN: 7.9 g/dL (ref 6.5–8.1)
Total Bilirubin: 0.8 mg/dL (ref 0.3–1.2)
Total Protein: 7.6 g/dL (ref 6.5–8.1)

## 2016-06-23 LAB — PROTEIN / CREATININE RATIO, URINE
Creatinine, Urine: 55 mg/dL
Protein Creatinine Ratio: 0.13 mg/mg{Cre} (ref 0.00–0.15)
Total Protein, Urine: 7 mg/dL

## 2016-06-23 LAB — CBC
HCT: 34.6 % — ABNORMAL LOW (ref 36.0–46.0)
HEMATOCRIT: 37.3 % (ref 36.0–46.0)
HEMOGLOBIN: 11.8 g/dL — AB (ref 12.0–15.0)
Hemoglobin: 12.9 g/dL (ref 12.0–15.0)
MCH: 29.3 pg (ref 26.0–34.0)
MCH: 29.3 pg (ref 26.0–34.0)
MCHC: 34.1 g/dL (ref 30.0–36.0)
MCHC: 34.6 g/dL (ref 30.0–36.0)
MCV: 85.9 fL (ref 78.0–100.0)
MCV: 84.8 fL (ref 78.0–100.0)
PLATELETS: 186 10*3/uL (ref 150–400)
Platelets: 192 10*3/uL (ref 150–400)
RBC: 4.03 MIL/uL (ref 3.87–5.11)
RBC: 4.4 MIL/uL (ref 3.87–5.11)
RDW: 13.4 % (ref 11.5–15.5)
RDW: 13.5 % (ref 11.5–15.5)
WBC: 4.8 10*3/uL (ref 4.0–10.5)
WBC: 8.4 10*3/uL (ref 4.0–10.5)

## 2016-06-23 MED ORDER — OXYCODONE HCL 5 MG PO TABS
5.0000 mg | ORAL_TABLET | ORAL | Status: DC | PRN
  Administered 2016-06-23 (×2): 5 mg via ORAL
  Filled 2016-06-23 (×2): qty 1

## 2016-06-23 MED ORDER — HYDRALAZINE HCL 20 MG/ML IJ SOLN
5.0000 mg | INTRAMUSCULAR | Status: AC | PRN
Start: 2016-06-23 — End: 2016-06-23
  Administered 2016-06-23: 5 mg via INTRAVENOUS
  Administered 2016-06-23: 10 mg via INTRAVENOUS
  Filled 2016-06-23: qty 1

## 2016-06-23 MED ORDER — IBUPROFEN 600 MG PO TABS: 600.0000 mg | ORAL_TABLET | Freq: Four times a day (QID) | ORAL | Status: DC | PRN

## 2016-06-23 MED ORDER — ACETAMINOPHEN 500 MG PO TABS: 1000.0000 mg | ORAL_TABLET | Freq: Four times a day (QID) | ORAL | Status: DC | PRN

## 2016-06-23 MED ORDER — OXYCODONE-ACETAMINOPHEN 5-325 MG PO TABS: 2.0000 | ORAL_TABLET | ORAL | Status: DC | PRN

## 2016-06-23 MED ORDER — MAGNESIUM SULFATE 50 % IJ SOLN
2.0000 g/h | INTRAVENOUS | Status: AC
  Administered 2016-06-24: 2 g/h via INTRAVENOUS
  Filled 2016-06-23 (×2): qty 80

## 2016-06-23 MED ORDER — METOCLOPRAMIDE HCL 5 MG/ML IJ SOLN
10.0000 mg | Freq: Four times a day (QID) | INTRAMUSCULAR | Status: DC | PRN
  Administered 2016-06-23 – 2016-06-24 (×2): 10 mg via INTRAVENOUS
  Filled 2016-06-23 (×2): qty 2

## 2016-06-23 MED ORDER — LABETALOL HCL 5 MG/ML IV SOLN: 20.0000 mg | INTRAVENOUS | Status: DC | PRN

## 2016-06-23 MED ORDER — LACTATED RINGERS IV SOLN
INTRAVENOUS | Status: DC
  Administered 2016-06-23: 13:00:00 via INTRAVENOUS

## 2016-06-23 MED ORDER — NIFEDIPINE ER 30 MG PO TB24
30.0000 mg | ORAL_TABLET | Freq: Every day | ORAL | Status: DC
  Administered 2016-06-23 – 2016-06-25 (×3): 30 mg via ORAL
  Filled 2016-06-23 (×5): qty 1

## 2016-06-23 MED ORDER — PRENATAL MULTIVITAMIN CH
1.0000 | ORAL_TABLET | Freq: Every day | ORAL | Status: DC
  Administered 2016-06-24 – 2016-06-25 (×2): 1 via ORAL
  Filled 2016-06-23 (×2): qty 1

## 2016-06-23 MED ORDER — LACTATED RINGERS IV SOLN
INTRAVENOUS | Status: DC
  Administered 2016-06-23 – 2016-06-24 (×3): via INTRAVENOUS

## 2016-06-23 MED ORDER — MAGNESIUM SULFATE BOLUS VIA INFUSION
4.0000 g | Freq: Once | INTRAVENOUS | Status: AC
  Administered 2016-06-23: 4 g via INTRAVENOUS
  Filled 2016-06-23: qty 500

## 2016-06-23 NOTE — Progress Notes (Signed)
Subjective:     Porshe Queen SloughJimoh is a 35 y.o. female who presents for a postpartum visit. She is 4 weeks postpartum following a spontaneous vaginal delivery. I have fully reviewed the prenatal and intrapartum course. The delivery was at 39 gestational weeks. Outcome: vaginal. Anesthesia: Natural childbirth. Postpartum course has been . Baby's course has been uncomplicated.Pecola Leisure. Baby is feeding by breast. Bleeding not at this time. Bowel function is normal. Bladder function is normal. Patient is not sexually active. Contraception method is none. Postpartum depression screening: negative.  The following portions of the patient's history were reviewed and updated as appropriate: allergies, current medications, past family history, past medical history, past social history, past surgical history and problem list.  Review of Systems Constitutional: negative Respiratory: negative Cardiovascular: negative Genitourinary:negative Neurological: positive for headaches   Objective:    BP (!) 151/101 Comment: 156/100  Pulse 68   General:  alert, cooperative and appears stated age  Lungs: clear to auscultation bilaterally  Heart:  regular rate and rhythm, S1, S2 normal, no murmur, click, rub or gallop  Abdomen: soft, non-tender; bowel sounds normal; no masses,  no organomegaly        Assessment:    Postpartum exam complicated by new onset hypertension. Will send pt to MAU for further evaluation.  Pap smear not done at today's visit.   Plan:    1. Contraception: none 2. Postpartum hypertension -- pt to MAU for further evaluation 3. Follow up in: 1 year for routine GYN care or as needed.     Judeth HornErin Lawrence, NP

## 2016-06-23 NOTE — Progress Notes (Signed)
4gram magnesium bolus dose in progress. Pt reports a 7/10 headache. Dr. Vergie LivingPickens aware. Order received for oxycodone 10mg . Carmelina DaneERRI L Meili Kleckley, RN

## 2016-06-23 NOTE — Progress Notes (Signed)
Pt is a post partum readmission. Pt has brought infant to room with her. Pt states that the husband has to go to work this evening and they have no one to watch the infant. Pt advised that the infant is not permitted to stay at the bedside without someone other than the patient to care for infant. Administration made aware of the situation. Carmelina DaneERRI L Margo Lama, RN

## 2016-06-23 NOTE — H&P (Signed)
Holly Oconnor is an 35 y.o. female. S/p SVD 4 weeks ago with postpartum HTN. Evaluation in MAU revealed nml labs, no proteinuria but severe range BPs and need for IV hypertensives. She denies HA, visual disturbances, epigastric pain, SOB, and CP.     Past Medical History:  Diagnosis Date  . AS (sickle cell trait) (HCC) 12/22/2012    No past surgical history on file.  No family history on file.  Social History:  reports that she has never smoked. She has never used smokeless tobacco. She reports that she does not drink alcohol or use drugs.  Allergies: No Known Allergies  Prescriptions Prior to Admission  Medication Sig Dispense Refill Last Dose  . ferrous sulfate (FERROUSUL) 325 (65 FE) MG tablet Take 1 tablet (325 mg total) by mouth 2 (two) times daily. (Patient not taking: Reported on 06/23/2016) 60 tablet 4 Not Taking at Unknown time  . ibuprofen (ADVIL,MOTRIN) 600 MG tablet Take 1 tablet (600 mg total) by mouth every 6 (six) hours. (Patient not taking: Reported on 06/23/2016) 30 tablet 0 Not Taking  . Prenat w/o A Vit-FeFum-FePo-FA (CONCEPT OB) 130-92.4-1 MG CAPS Take 1 capsule by mouth daily. (Patient not taking: Reported on 06/23/2016) 30 capsule 11 Not Taking at Unknown time    Review of Systems  Constitutional: Negative.   HENT: Negative.   Respiratory: Negative.   Gastrointestinal: Negative.     Blood pressure 138/81, pulse 75, temperature 98.1 F (36.7 C), temperature source Oral, resp. rate 16, unknown if currently breastfeeding. Physical Exam  Constitutional: She is oriented to person, place, and time. She appears well-developed and well-nourished.  HENT:  Head: Normocephalic and atraumatic.  Neck: Normal range of motion. Neck supple.  Cardiovascular: Normal rate and regular rhythm.   Respiratory: Effort normal and breath sounds normal.  GI: Soft. She exhibits no distension. There is no tenderness.  Musculoskeletal: Normal range of motion.  Neurological: She is  alert and oriented to person, place, and time.  Skin: Skin is warm and dry.  Psychiatric: She has a normal mood and affect.    Results for orders placed or performed during the hospital encounter of 06/23/16 (from the past 24 hour(s))  CBC     Status: Abnormal   Collection Time: 06/23/16 12:06 PM  Result Value Ref Range   WBC 4.8 4.0 - 10.5 K/uL   RBC 4.03 3.87 - 5.11 MIL/uL   Hemoglobin 11.8 (L) 12.0 - 15.0 g/dL   HCT 16.1 (L) 09.6 - 04.5 %   MCV 85.9 78.0 - 100.0 fL   MCH 29.3 26.0 - 34.0 pg   MCHC 34.1 30.0 - 36.0 g/dL   RDW 40.9 81.1 - 91.4 %   Platelets 186 150 - 400 K/uL  Comprehensive metabolic panel     Status: None   Collection Time: 06/23/16 12:06 PM  Result Value Ref Range   Sodium 139 135 - 145 mmol/L   Potassium 3.9 3.5 - 5.1 mmol/L   Chloride 105 101 - 111 mmol/L   CO2 27 22 - 32 mmol/L   Glucose, Bld 99 65 - 99 mg/dL   BUN 14 6 - 20 mg/dL   Creatinine, Ser 7.82 0.44 - 1.00 mg/dL   Calcium 9.4 8.9 - 95.6 mg/dL   Total Protein 7.6 6.5 - 8.1 g/dL   Albumin 4.1 3.5 - 5.0 g/dL   AST 27 15 - 41 U/L   ALT 29 14 - 54 U/L   Alkaline Phosphatase 60 38 - 126 U/L  Total Bilirubin 0.8 0.3 - 1.2 mg/dL   GFR calc non Af Amer >60 >60 mL/min   GFR calc Af Amer >60 >60 mL/min   Anion gap 7 5 - 15  Protein / creatinine ratio, urine     Status: None   Collection Time: 06/23/16 12:24 PM  Result Value Ref Range   Creatinine, Urine 55.00 mg/dL   Total Protein, Urine 7 mg/dL   Protein Creatinine Ratio 0.13 0.00 - 0.15 mg/mg[Cre]    Assessment/Plan: Postpartum HTN Admit to Med-Surg Magnesium Sulfate for 24 hrs Antihypertensives Strict I/O  Donette LarryMelanie Neeya Prigmore, CNM 06/23/2016, 2:23 PM

## 2016-06-23 NOTE — MAU Note (Signed)
Sent up from clinic for blood pressure evaluation.

## 2016-06-23 NOTE — Patient Instructions (Signed)
Health Maintenance, Female Adopting a healthy lifestyle and getting preventive care can go a long way to promote health and wellness. Talk with your health care provider about what schedule of regular examinations is right for you. This is a good chance for you to check in with your provider about disease prevention and staying healthy. In between checkups, there are plenty of things you can do on your own. Experts have done a lot of research about which lifestyle changes and preventive measures are most likely to keep you healthy. Ask your health care provider for more information. WEIGHT AND DIET  Eat a healthy diet  Be sure to include plenty of vegetables, fruits, low-fat dairy products, and lean protein.  Do not eat a lot of foods high in solid fats, added sugars, or salt.  Get regular exercise. This is one of the most important things you can do for your health.  Most adults should exercise for at least 150 minutes each week. The exercise should increase your heart rate and make you sweat (moderate-intensity exercise).  Most adults should also do strengthening exercises at least twice a week. This is in addition to the moderate-intensity exercise.  Maintain a healthy weight  Body mass index (BMI) is a measurement that can be used to identify possible weight problems. It estimates body fat based on height and weight. Your health care provider can help determine your BMI and help you achieve or maintain a healthy weight.  For females 20 years of age and older:   A BMI below 18.5 is considered underweight.  A BMI of 18.5 to 24.9 is normal.  A BMI of 25 to 29.9 is considered overweight.  A BMI of 30 and above is considered obese.  Watch levels of cholesterol and blood lipids  You should start having your blood tested for lipids and cholesterol at 34 years of age, then have this test every 5 years.  You may need to have your cholesterol levels checked more often if:  Your lipid  or cholesterol levels are high.  You are older than 34 years of age.  You are at high risk for heart disease.  CANCER SCREENING   Lung Cancer  Lung cancer screening is recommended for adults 55-80 years old who are at high risk for lung cancer because of a history of smoking.  A yearly low-dose CT scan of the lungs is recommended for people who:  Currently smoke.  Have quit within the past 15 years.  Have at least a 30-pack-year history of smoking. A pack year is smoking an average of one pack of cigarettes a day for 1 year.  Yearly screening should continue until it has been 15 years since you quit.  Yearly screening should stop if you develop a health problem that would prevent you from having lung cancer treatment.  Breast Cancer  Practice breast self-awareness. This means understanding how your breasts normally appear and feel.  It also means doing regular breast self-exams. Let your health care provider know about any changes, no matter how small.  If you are in your 20s or 30s, you should have a clinical breast exam (CBE) by a health care provider every 1-3 years as part of a regular health exam.  If you are 40 or older, have a CBE every year. Also consider having a breast X-ray (mammogram) every year.  If you have a family history of breast cancer, talk to your health care provider about genetic screening.  If you   are at high risk for breast cancer, talk to your health care provider about having an MRI and a mammogram every year.  Breast cancer gene (BRCA) assessment is recommended for women who have family members with BRCA-related cancers. BRCA-related cancers include:  Breast.  Ovarian.  Tubal.  Peritoneal cancers.  Results of the assessment will determine the need for genetic counseling and BRCA1 and BRCA2 testing. Cervical Cancer Your health care provider may recommend that you be screened regularly for cancer of the pelvic organs (ovaries, uterus, and  vagina). This screening involves a pelvic examination, including checking for microscopic changes to the surface of your cervix (Pap test). You may be encouraged to have this screening done every 3 years, beginning at age 21.  For women ages 30-65, health care providers may recommend pelvic exams and Pap testing every 3 years, or they may recommend the Pap and pelvic exam, combined with testing for human papilloma virus (HPV), every 5 years. Some types of HPV increase your risk of cervical cancer. Testing for HPV may also be done on women of any age with unclear Pap test results.  Other health care providers may not recommend any screening for nonpregnant women who are considered low risk for pelvic cancer and who do not have symptoms. Ask your health care provider if a screening pelvic exam is right for you.  If you have had past treatment for cervical cancer or a condition that could lead to cancer, you need Pap tests and screening for cancer for at least 20 years after your treatment. If Pap tests have been discontinued, your risk factors (such as having a new sexual partner) need to be reassessed to determine if screening should resume. Some women have medical problems that increase the chance of getting cervical cancer. In these cases, your health care provider may recommend more frequent screening and Pap tests. Colorectal Cancer  This type of cancer can be detected and often prevented.  Routine colorectal cancer screening usually begins at 34 years of age and continues through 34 years of age.  Your health care provider may recommend screening at an earlier age if you have risk factors for colon cancer.  Your health care provider may also recommend using home test kits to check for hidden blood in the stool.  A small camera at the end of a tube can be used to examine your colon directly (sigmoidoscopy or colonoscopy). This is done to check for the earliest forms of colorectal  cancer.  Routine screening usually begins at age 50.  Direct examination of the colon should be repeated every 5-10 years through 34 years of age. However, you may need to be screened more often if early forms of precancerous polyps or small growths are found. Skin Cancer  Check your skin from head to toe regularly.  Tell your health care provider about any new moles or changes in moles, especially if there is a change in a mole's shape or color.  Also tell your health care provider if you have a mole that is larger than the size of a pencil eraser.  Always use sunscreen. Apply sunscreen liberally and repeatedly throughout the day.  Protect yourself by wearing long sleeves, pants, a wide-brimmed hat, and sunglasses whenever you are outside. HEART DISEASE, DIABETES, AND HIGH BLOOD PRESSURE   High blood pressure causes heart disease and increases the risk of stroke. High blood pressure is more likely to develop in:  People who have blood pressure in the high end   of the normal range (130-139/85-89 mm Hg).  People who are overweight or obese.  People who are African American.  If you are 72-37 years of age, have your blood pressure checked every 3-5 years. If you are 37 years of age or older, have your blood pressure checked every year. You should have your blood pressure measured twice--once when you are at a hospital or clinic, and once when you are not at a hospital or clinic. Record the average of the two measurements. To check your blood pressure when you are not at a hospital or clinic, you can use:  An automated blood pressure machine at a pharmacy.  A home blood pressure monitor.  If you are between 37 years and 59 years old, ask your health care provider if you should take aspirin to prevent strokes.  Have regular diabetes screenings. This involves taking a blood sample to check your fasting blood sugar level.  If you are at a normal weight and have a low risk for diabetes,  have this test once every three years after 33 years of age.  If you are overweight and have a high risk for diabetes, consider being tested at a younger age or more often. PREVENTING INFECTION  Hepatitis B  If you have a higher risk for hepatitis B, you should be screened for this virus. You are considered at high risk for hepatitis B if:  You were born in a country where hepatitis B is common. Ask your health care provider which countries are considered high risk.  Your parents were born in a high-risk country, and you have not been immunized against hepatitis B (hepatitis B vaccine).  You have HIV or AIDS.  You use needles to inject street drugs.  You live with someone who has hepatitis B.  You have had sex with someone who has hepatitis B.  You get hemodialysis treatment.  You take certain medicines for conditions, including cancer, organ transplantation, and autoimmune conditions. Hepatitis C  Blood testing is recommended for:  Everyone born from [birth year] through 1964.  Anyone with known risk factors for hepatitis C. Sexually transmitted infections (STIs)  You should be screened for sexually transmitted infections (STIs) including gonorrhea and chlamydia if:  You are sexually active and are younger than 34 years of age.  You are older than 34 years of age and your health care provider tells you that you are at risk for this type of infection.  Your sexual activity has changed since you were last screened and you are at an increased risk for chlamydia or gonorrhea. Ask your health care provider if you are at risk.  If you do not have HIV, but are at risk, it may be recommended that you take a prescription medicine daily to prevent HIV infection. This is called pre-exposure prophylaxis (PrEP). You are considered at risk if:  You are sexually active and do not regularly use condoms or know the HIV status of your partner(s).  You take drugs by injection.  You are sexually  active with a partner who has HIV. Talk with your health care provider about whether you are at high risk of being infected with HIV. If you choose to begin PrEP, you should first be tested for HIV. You should then be tested every 3 months for as long as you are taking PrEP.  PREGNANCY   If you are premenopausal and you may become pregnant, ask your health care provider about preconception counseling.  If you may  become pregnant, take 400 to 800 micrograms (mcg) of folic acid every day.  If you want to prevent pregnancy, talk to your health care provider about birth control (contraception). OSTEOPOROSIS AND MENOPAUSE   Osteoporosis is a disease in which the bones lose minerals and strength with aging. This can result in serious bone fractures. Your risk for osteoporosis can be identified using a bone density scan.  If you are 36 years of age or older, or if you are at risk for osteoporosis and fractures, ask your health care provider if you should be screened.  Ask your health care provider whether you should take a calcium or vitamin D supplement to lower your risk for osteoporosis.  Menopause may have certain physical symptoms and risks.  Hormone replacement therapy may reduce some of these symptoms and risks. Talk to your health care provider about whether hormone replacement therapy is right for you.  HOME CARE INSTRUCTIONS   Schedule regular health, dental, and eye exams.  Stay current with your immunizations.   Do not use any tobacco products including cigarettes, chewing tobacco, or electronic cigarettes.  If you are pregnant, do not drink alcohol.  If you are breastfeeding, limit how much and how often you drink alcohol.  Limit alcohol intake to no more than 1 drink per day for nonpregnant women. One drink equals 12 ounces of beer, 5 ounces of wine, or 1 ounces of hard liquor.  Do not use street drugs.  Do not share needles.  Ask your health care provider for help if  you need support or information about quitting drugs.  Tell your health care provider if you often feel depressed.  Tell your health care provider if you have ever been abused or do not feel safe at home.   This information is not intended to replace advice given to you by your health care provider. Make sure you discuss any questions you have with your health care provider.   Document Released: 03/16/2011 Document Revised: 09/21/2014 Document Reviewed: 08/02/2013 Elsevier Interactive Patient Education [employer].

## 2016-06-23 NOTE — MAU Provider Note (Signed)
History     CSN: [id number]  Arrival date and time: 06/23/16 1131   None     Chief Complaint  Patient presents with  . sent from clinic   S/p SVD 4.5 weeks ago seen in clinic this am for pp check and sent for elevated BPs. She denies HA, visual disturbances, and epigastric pain. No SOB or chest pain. She is breastfeeding. Lochia is scant.     OB History    Gravida Para Term Preterm AB Living   3 3 3  0 0 3   SAB TAB Ectopic Multiple Live Births   0 0 0 0 3      Past Medical History:  Diagnosis Date  . AS (sickle cell trait) (HCC) 12/22/2012    No past surgical history on file.  No family history on file.  Social History  Substance Use Topics  . Smoking status: Never Smoker  . Smokeless tobacco: Never Used  . Alcohol use No    Allergies: No Known Allergies  Prescriptions Prior to Admission  Medication Sig Dispense Refill Last Dose  . ferrous sulfate (FERROUSUL) 325 (65 FE) MG tablet Take 1 tablet (325 mg total) by mouth 2 (two) times daily. 60 tablet 4 Taking  . ibuprofen (ADVIL,MOTRIN) 600 MG tablet Take 1 tablet (600 mg total) by mouth every 6 (six) hours. (Patient not taking: Reported on 06/23/2016) 30 tablet 0 Not Taking  . Prenat w/o A Vit-FeFum-FePo-FA (CONCEPT OB) 130-92.4-1 MG CAPS Take 1 capsule by mouth daily. 30 capsule 11 Taking    Review of Systems  Constitutional: Negative.   HENT: Negative.   Respiratory: Negative.   Gastrointestinal: Negative.    Physical Exam   Blood pressure (!) 170/105, pulse 69, temperature 98.1 F (36.7 C), temperature source Oral, resp. rate 16, unknown if currently breastfeeding.  Physical Exam  Constitutional: She is oriented to person, place, and time. She appears well-developed and well-nourished.  HENT:  Head: Normocephalic and atraumatic.  Neck: Normal range of motion. Neck supple.  Cardiovascular: Normal rate and regular rhythm.   Respiratory: Effort normal and breath sounds normal.  GI: Soft. She  exhibits no distension and no mass. There is no tenderness. There is no rebound and no guarding.  Musculoskeletal: Normal range of motion.  Neurological: She is alert and oriented to person, place, and time.  Skin: Skin is warm and dry.  Psychiatric: She has a normal mood and affect.   Results for orders placed or performed during the hospital encounter of 06/23/16 (from the past 24 hour(s))  CBC     Status: Abnormal   Collection Time: 06/23/16 12:06 PM  Result Value Ref Range   WBC 4.8 4.0 - 10.5 K/uL   RBC 4.03 3.87 - 5.11 MIL/uL   Hemoglobin 11.8 (L) 12.0 - 15.0 g/dL   HCT 14.7 (L) 82.9 - 56.2 %   MCV 85.9 78.0 - 100.0 fL   MCH 29.3 26.0 - 34.0 pg   MCHC 34.1 30.0 - 36.0 g/dL   RDW 13.0 86.5 - 78.4 %   Platelets 186 150 - 400 K/uL  Comprehensive metabolic panel     Status: None   Collection Time: 06/23/16 12:06 PM  Result Value Ref Range   Sodium 139 135 - 145 mmol/L   Potassium 3.9 3.5 - 5.1 mmol/L   Chloride 105 101 - 111 mmol/L   CO2 27 22 - 32 mmol/L   Glucose, Bld 99 65 - 99 mg/dL   BUN 14 6 - 20  mg/dL   Creatinine, Ser 1.610.71 0.44 - 1.00 mg/dL   Calcium 9.4 8.9 - 09.610.3 mg/dL   Total Protein 7.6 6.5 - 8.1 g/dL   Albumin 4.1 3.5 - 5.0 g/dL   AST 27 15 - 41 U/L   ALT 29 14 - 54 U/L   Alkaline Phosphatase 60 38 - 126 U/L   Total Bilirubin 0.8 0.3 - 1.2 mg/dL   GFR calc non Af Amer >60 >60 mL/min   GFR calc Af Amer >60 >60 mL/min   Anion gap 7 5 - 15  Protein / creatinine ratio, urine     Status: None   Collection Time: 06/23/16 12:24 PM  Result Value Ref Range   Creatinine, Urine 55.00 mg/dL   Total Protein, Urine 7 mg/dL   Protein Creatinine Ratio 0.13 0.00 - 0.15 mg/mg[Cre]    MAU Course  Procedures Iv LR Hydralazine IV protocol  MDM Labs ordered and reviewed. After review of records, a few elevated mild rage BPs were noted during postpartum stay. Discussed presentation and clinical findings with Dr. Vergie LivingPickens, will admit.  Assessment and Plan   1.  Postpartum hypertension    Admit Magnesium Sulfate Antihypertensives  Donette LarryMelanie Yvett Rossel, CNM 06/23/2016, 1:03 PM

## 2016-06-24 ENCOUNTER — Encounter (HOSPITAL_COMMUNITY): Payer: Self-pay | Admitting: *Deleted

## 2016-06-24 LAB — CBC
HEMATOCRIT: 36.9 % (ref 36.0–46.0)
HEMOGLOBIN: 12.7 g/dL (ref 12.0–15.0)
MCH: 28.9 pg (ref 26.0–34.0)
MCHC: 34.4 g/dL (ref 30.0–36.0)
MCV: 84.1 fL (ref 78.0–100.0)
PLATELETS: 194 10*3/uL (ref 150–400)
RBC: 4.39 MIL/uL (ref 3.87–5.11)
RDW: 13.6 % (ref 11.5–15.5)
WBC: 10 10*3/uL (ref 4.0–10.5)

## 2016-06-24 LAB — COMPREHENSIVE METABOLIC PANEL
ALK PHOS: 63 U/L (ref 38–126)
ALT: 31 U/L (ref 14–54)
ANION GAP: 8 (ref 5–15)
AST: 28 U/L (ref 15–41)
Albumin: 4.3 g/dL (ref 3.5–5.0)
BILIRUBIN TOTAL: 1.1 mg/dL (ref 0.3–1.2)
BUN: 7 mg/dL (ref 6–20)
CHLORIDE: 102 mmol/L (ref 101–111)
CO2: 25 mmol/L (ref 22–32)
Calcium: 7 mg/dL — ABNORMAL LOW (ref 8.9–10.3)
Creatinine, Ser: 0.45 mg/dL (ref 0.44–1.00)
GFR calc Af Amer: >60 mL/min (ref >60)
GLUCOSE: 114 mg/dL — AB (ref 65–99)
POTASSIUM: 3.3 mmol/L — AB (ref 3.5–5.1)
Sodium: 135 mmol/L (ref 135–145)
Total Protein: 7.9 g/dL (ref 6.5–8.1)

## 2016-06-24 LAB — MAGNESIUM: Magnesium: 6.7 mg/dL (ref 1.7–2.4)

## 2016-06-24 NOTE — Lactation Note (Signed)
Lactation Consultation Note  Patient Name: Holly Oconnor Today's Date: 06/24/2016   Spoke with mom who was readmitted with one month old. Infant is with mom and BF well per mom. She denies Lactation needs at this time Follow up prn.      Maternal Data    Feeding    LATCH Score/Interventions                      Lactation Tools Discussed/Used     Consult Status      Ed BlalockSharon S Lavonne Kinderman 06/24/2016, 5:59 PM

## 2016-06-24 NOTE — Progress Notes (Signed)
Mag level of 6.7 mg  Communicated with M.D. No further orders.

## 2016-06-24 NOTE — Progress Notes (Addendum)
Daily Post Partum Note  Holly Oconnor is a 35 y.o. G3P3003 PP 05/22/16 s/p  SVD and HD#2. Patient admitted for severe HTN Pregnancy c/b nothing 24hr/overnight events:  none  Subjective:  No s/s of pre-x  Objective:    Current Vital Signs 24h Vital Sign Ranges  T 98.1 F (36.7 C) Temp  Avg: 98 F (36.7 C)  Min: 97.8 F (36.6 C)  Max: 98.1 F (36.7 C)  BP 132/82 BP  Min: 116/68  Max: 192/110  HR 91 Pulse  Avg: 78.2  Min: 65  Max: 92  RR 18 Resp  Avg: 17.2  Min: 16  Max: 18  SaO2 100 % Not Delivered SpO2  Avg: 100 %  Min: 100 %  Max: 100 %       24 Hour I/O Current Shift I/O  Time Ins Outs 10/10 0701 - 10/11 0700 In: 1200 [P.O.:600; I.V.:600] Out: 4000 [Urine:3700] No intake/output data recorded.  UOP: at least 100/hr  Patient Vitals for the past 24 hrs:  BP Temp Temp src Pulse Resp SpO2 Height Weight  06/24/16 0600 132/82 - - 91 18 100 % - 61.1 kg (134 lb 12 oz)  06/24/16 0500 137/74 - - 87 18 100 % - -  06/24/16 0400 128/73 - - 86 18 100 % - -  06/24/16 0300 123/69 - - 80 18 100 % - -  06/24/16 0200 116/68 - - 79 16 100 % - -  06/24/16 0059 124/73 - - 84 18 100 % - -  06/24/16 0000 139/78 - - 92 18 100 % - -  06/23/16 2300 135/78 - - 90 18 100 % - -  06/23/16 2200 (!) 145/78 - - 90 18 100 % - -  06/23/16 2100 (!) 141/83 - - 86 16 100 % - -  06/23/16 2000 (!) 141/86 - - 90 16 100 % - -  06/23/16 1900 137/80 - - 86 16 - - -  06/23/16 1718 (!) 148/82 98.1 F (36.7 C) Oral 81 18 100 % - -  06/23/16 1600 (!) 145/94 - - 72 16 - - -  06/23/16 1545 138/79 - - 74 18 - - -  06/23/16 1530 139/82 - - 72 18 - - -  06/23/16 1505 - - - - - - 5\' 3"  (1.6 m) 63.5 kg (140 lb)  06/23/16 1500 (!) 145/83 98.1 F (36.7 C) Oral 74 16 - - -  06/23/16 1440 140/77 97.8 F (36.6 C) Oral 83 16 - - -  06/23/16 1434 140/78 - - 85 - - - -  06/23/16 1410 138/81 - - 75 - - - -  06/23/16 1401 166/93 - - 68 - - - -  06/23/16 1358 178/90 - - 68 - - - -  06/23/16 1345 178/92 - - 72 - - - -  06/23/16  1331 (!) 190/102 - - 67 - - - -  06/23/16 1315 (!) 192/110 - - 70 - - - -  06/23/16 1300 (!) 191/107 - - 68 - - - -  06/23/16 1245 (!) 178/108 - - 73 - - - -  06/23/16 1235 (!) 170/105 - - 69 - - - -  06/23/16 1226 160/94 - - 65 - - - -  06/23/16 1203 159/94 98.1 F (36.7 C) Oral - 16 - - -    General: NAD Abdomen: nttp Perineum: deferred Skin:  Warm and dry.  Cardiovascular: S1, S2 normal, no murmur, rub or gallop, regular rate and  rhythm Respiratory:  Clear to auscultation bilateral. Normal respiratory effort Extremities: no c/c/e Neuro: 1+ brachial b/l  Medications Current Facility-Administered Medications  Medication Dose Route Frequency Provider Last Rate Last Dose  . acetaminophen (TYLENOL) tablet 1,000 mg  1,000 mg Oral Q6H PRN [place] Bingharlie Mashelle Busick, MD      . ibuprofen (ADVIL,MOTRIN) tablet 600 mg  600 mg Oral Q6H PRN [place] Bingharlie Martavius Lusty, MD      . labetalol (NORMODYNE,TRANDATE) injection 20-80 mg  20-80 mg Intravenous Q10 min PRN Donette LarryMelanie Bhambri, CNM      . lactated ringers infusion   Intravenous Continuous Donette LarryMelanie Bhambri, CNM 75 mL/hr at 06/24/16 0004    . magnesium sulfate 40 g in lactated ringers 500 mL (0.08 g/mL) OB infusion  2 g/hr Intravenous Continuous Donette LarryMelanie Bhambri, CNM 25 mL/hr at 06/24/16 0600 2 g/hr at 06/24/16 0600  . metoCLOPramide (REGLAN) injection 10 mg  10 mg Intravenous Q6H PRN [place] Bingharlie Takeela Peil, MD   10 mg at 06/24/16 0452  . NIFEdipine (PROCARDIA-XL/ADALAT CC) 24 hr tablet 30 mg  30 mg Oral Daily [place] Bingharlie Mahreen Schewe, MD   30 mg at 06/23/16 1907  . oxyCODONE (Oxy IR/ROXICODONE) immediate release tablet 5 mg  5 mg Oral Q4H PRN [place] Bingharlie Yuta Cipollone, MD   5 mg at 06/23/16 2207  . prenatal multivitamin tablet 1 tablet  1 tablet Oral Q1200 Donette LarryMelanie Bhambri, CNM   Stopped at 06/23/16 1658    Labs:   Recent Labs Lab 06/23/16 1206 06/23/16 1918 06/24/16 0545  WBC 4.8 8.4 10.0  HGB 11.8* 12.9 12.7  HCT 34.6* 37.3 36.9  PLT 186 192 194    Recent Labs Lab 06/23/16 1206  06/23/16 1918 06/24/16 0545  NA 139 138 135  K 3.9 3.4* 3.3*  CL 105 104 102  CO2 27 24 25   BUN 14 10 7   CREATININE 0.71 0.51 0.45  GLUCOSE 99 105* 114*  CALCIUM 9.4 8.3* 7.0*   Mg: 6.7 @ 0545 on 10/11 PC ratio: 130  Assessment & Plan:  Pt doing well  *Postpartum/postop: routine care. [facility] *Severe HTN: continue Mg until 1515. Patient started on procardia 30 xl at 601900 yesterday.  -pt s/p hydralazine IV in the [facility] on [facility] *Dispo: hopefully tomorrow  Steele Bingharlie Kylen Schliep, Montez HagemanJr. MD [facility]ending [facility](Faculty Practice)

## 2016-06-25 MED ORDER — NIFEDIPINE ER 30 MG PO TB24: 30.0000 mg | ORAL_TABLET | Freq: Every day | ORAL | 2 refills | Status: DC

## 2016-06-25 NOTE — Discharge Summary (Signed)
Physician Discharge Summary  Patient ID: Holly Oconnor MRN: [id number] DOB/AGE: [birth year] 35 y.o.  Admit date: 06/23/2016 Discharge date: 06/25/2016  Admission Diagnoses: Postpartum preeclampsia with severe features.  Discharge Diagnoses:  Active Problems:   Postpartum hypertension  Discharged Condition: good  Hospital Course: Pt was admitted to the hospital and started on Magnesium sulfate which she remained on for 24 hours.   She was also started on BP meds.  She has been off magnesium overnight and she denies HA or visual changes. She denies SOB.   Consults: None  Significant Diagnostic Studies: labs: Pr:cr ratio; CBC, CMP  Treatments: Magnesium sulfate for 24 hours.  Discharge Exam: Blood pressure 136/83, pulse 90, temperature 98.7 F (37.1 C), temperature source Oral, resp. rate 16, height 5\' 3"  (1.6 m), weight 132 lb (59.9 kg), SpO2 100 %, unknown if currently breastfeeding.  I/O last 3 completed shifts: In: 520 [P.O.:520] Out: 1800 [Urine:1800] Total I/O In: 3359.6 [P.O.:1480; I.V.:1879.6] Out: 2800 [Urine:2800]   General appearance: alert and no distress Resp: clear to auscultation bilaterally Cardio: regular rate and rhythm, S1, S2 normal, no murmur, click, rub or gallop GI: soft, non-tender; bowel sounds normal; no masses,  no organomegaly Extremities: extremities normal, atraumatic, no cyanosis or edema   CBC    Component Value Date/Time   WBC 10.0 06/24/2016 0545   RBC 4.39 06/24/2016 0545   HGB 12.7 06/24/2016 0545   HCT 36.9 06/24/2016 0545   PLT 194 06/24/2016 0545   MCV 84.1 06/24/2016 0545   MCH 28.9 06/24/2016 0545   MCHC 34.4 06/24/2016 0545   RDW 13.6 06/24/2016 0545   LYMPHSABS 1.3 11/20/2015 1416   MONOABS 0.6 11/20/2015 1416   EOSABS 0.1 11/20/2015 1416   BASOSABS 0.0 11/20/2015 1416     Disposition: 01-Home or Self Care  Discharge Instructions    Activity as tolerated    Complete by:  As directed    Call MD for:  difficulty  breathing, headache or visual disturbances    Complete by:  As directed    Call MD for:  extreme fatigue    Complete by:  As directed    Call MD for:  hives    Complete by:  As directed    Call MD for:  persistant dizziness or light-headedness    Complete by:  As directed    Call MD for:  persistant nausea and vomiting    Complete by:  As directed    Call MD for:  redness, tenderness, or signs of infection (pain, swelling, redness, odor or green/yellow discharge around incision site)    Complete by:  As directed    Call MD for:  severe uncontrolled pain    Complete by:  As directed    Call MD for:  temperature >100.4    Complete by:  As directed    Diet - low sodium heart healthy    Complete by:  As directed        Medication List    TAKE these medications   CONCEPT OB 130-92.4-1 MG Caps Take 1 capsule by mouth daily.   ferrous sulfate 325 (65 FE) MG tablet Commonly known as:  FERROUSUL Take 1 tablet (325 mg total) by mouth 2 (two) times daily.   ibuprofen 600 MG tablet Commonly known as:  ADVIL,MOTRIN Take 1 tablet (600 mg total) by mouth every 6 (six) hours.   NIFEdipine 30 MG 24 hr tablet Commonly known as:  PROCARDIA-XL/ADALAT CC Take 1 tablet (30 mg total) by  mouth daily.      Follow-up Information    [facility]   Contact information: [address] [place] [id number] [phone]          Signed: Willodean RosenthalHARRAWAY-SMITH, Felix Pratt 06/25/2016, 10:00 AM

## 2016-06-25 NOTE — Discharge Instructions (Signed)

## 2016-06-25 NOTE — Progress Notes (Signed)
Pt discharged home per orders via wheelchair. Discharge instructions reviewed with patient. Reviewed emergent symptoms and when to call MD. All belongings sent home with patient

## 2016-07-30 ENCOUNTER — Ambulatory Visit: Payer: Medicaid Other | Admitting: Obstetrics & Gynecology

## 2016-07-30 ENCOUNTER — Encounter: Payer: Self-pay | Admitting: Obstetrics & Gynecology

## 2016-07-30 VITALS — BP 121/78 | HR 83 | Temp 97.3°F | Ht 63.0 in | Wt 138.2 lb

## 2016-07-30 DIAGNOSIS — O1002 Pre-existing essential hypertension complicating childbirth: Secondary | ICD-10-CM

## 2016-07-30 NOTE — Progress Notes (Signed)
..  Post Partum Exam  Holly Oconnor is a 35 y.o. 643P3003 female who presents for a postpartum visit. She is 10 weeks postpartum following a spontaneous vaginal delivery. I have fully reviewed the prenatal and intrapartum course. The delivery was at 39.6 gestational weeks.  Anesthesia: none. Postpartum course has been complicated by postpartum hypertension, was hospitalized 34 weeks PP. Baby's course has been good. Baby is feeding by breast. Bleeding staining only. Bowel function is normal. Bladder function is normal. Patient is not sexually active. Contraception method is abstinence. Postpartum depression screening:neg  The following portions of the patient's history were reviewed and updated as appropriate: allergies, current medications, past family history, past medical history, past social history, past surgical history and problem list.  Review of Systems Pertinent items are noted in HPI.    Objective:    BP 116/78 mmHg  Pulse 78  Resp 16  Ht 5\' 5"  (1.651 m)  Wt 211 lb (95.709 kg)  BMI 35.11 kg/m2  Breastfeeding? Yes  General:                         Corpus: not examined  Adnexa:  not evaluated  Rectal Exam: Not performed.        Assessment:    34 week postpartum exam. Pap smear not done at today's visit.   Plan:   1. Contraception: none 2. Needs PCP referral and will stop her Procardia 3. Follow up as needed 07/30/2016 Adam PhenixJames G Arnold, MD

## 2016-09-01 ENCOUNTER — Encounter (HOSPITAL_COMMUNITY): Payer: Self-pay | Admitting: *Deleted

## 2016-09-01 ENCOUNTER — Ambulatory Visit (HOSPITAL_COMMUNITY)
Admission: EM | Admit: 2016-09-01 | Discharge: 2016-09-01 | Disposition: A | Discharge status: Home / Self Care | Payer: Medicaid Other | Attending: Family Medicine | Admitting: Family Medicine

## 2016-09-01 DIAGNOSIS — M546 Pain in thoracic spine: Secondary | ICD-10-CM | POA: Diagnosis present

## 2016-09-01 DIAGNOSIS — I10 Essential (primary) hypertension: Secondary | ICD-10-CM | POA: Diagnosis not present

## 2016-09-01 HISTORY — DX: Essential (primary) hypertension: I10

## 2016-09-01 LAB — POCT I-STAT, CHEM 8
BUN: 15 mg/dL (ref 6–20)
CALCIUM ION: 1.23 mmol/L (ref 1.15–1.40)
CHLORIDE: 103 mmol/L (ref 101–111)
Creatinine, Ser: 0.6 mg/dL (ref 0.44–1.00)
Glucose, Bld: 84 mg/dL (ref 65–99)
HEMATOCRIT: 42 % (ref 36.0–46.0)
HEMOGLOBIN: 14.3 g/dL (ref 12.0–15.0)
POTASSIUM: 3.7 mmol/L (ref 3.5–5.1)
Sodium: 142 mmol/L (ref 135–145)
TCO2: 27 mmol/L (ref 0–100)

## 2016-09-01 MED ORDER — NIFEDIPINE ER OSMOTIC RELEASE 30 MG PO TB24: 30.0000 mg | ORAL_TABLET | Freq: Every day | ORAL | 0 refills | Status: DC

## 2016-09-01 NOTE — ED Provider Notes (Signed)
CSN: [id number]     Arrival date & time 09/01/16  1116 History   None    Chief Complaint  Patient presents with  . Back Pain   (Consider location/radiation/quality/duration/timing/severity/associated sxs/prior Treatment) 35 year old female patient presents for evaluation and management of hypertension and chief complaint of upper back pain. Pt was diagnosed with gestational HTN during her last pregnancy, she is currently 3 months post partum, reports no complications during delivery, delivered a healthy, full-term infant. Patient was managed during her pregnancy with Procadia 30 mg qd. She reports she has had some spotting since her delivery by no significant vaginal hemorrhage, cramping, or other symptoms. She reports no signs or symptoms of post partum depression and states her mood has been good. Additionally, patient expresses wish to discuss family planning.   The history is provided by the patient.    Past Medical History:  Diagnosis Date  . AS (sickle cell trait) (HCC) 12/22/2012  . Hypertension    History reviewed. No pertinent surgical history. History reviewed. No pertinent family history. Social History  Substance Use Topics  . Smoking status: Never Smoker  . Smokeless tobacco: Never Used  . Alcohol use No   OB History    Gravida Para Term Preterm AB Living   3 3 3  0 0 3   SAB TAB Ectopic Multiple Live Births   0 0 0 0 3     Review of Systems  Constitutional: Negative for activity change, appetite change, diaphoresis, fatigue and fever.  HENT: Negative for congestion, drooling, ear pain, nosebleeds, postnasal drip, rhinorrhea, sinus pain, sinus pressure, sneezing and sore throat.   Eyes: Negative for discharge and itching.  Respiratory: Negative for apnea, cough, chest tightness, shortness of breath and wheezing.   Cardiovascular: Negative for chest pain, palpitations and leg swelling.  Gastrointestinal: Negative for abdominal pain, blood in stool, constipation,  diarrhea, nausea and vomiting.  Endocrine: Negative for cold intolerance, heat intolerance, polydipsia and polyuria.  Genitourinary: Positive for vaginal bleeding (spotting). Negative for difficulty urinating, dysuria, flank pain, frequency, menstrual problem, pelvic pain, vaginal discharge and vaginal pain.  Musculoskeletal: Positive for back pain. Negative for arthralgias, joint swelling and myalgias.  Allergic/Immunologic: Negative for environmental allergies and food allergies.  Neurological: Negative for dizziness, tremors, syncope, weakness, light-headedness and headaches.  Psychiatric/Behavioral: Negative for agitation, behavioral problems, dysphoric mood, sleep disturbance and suicidal ideas. The patient is not nervous/anxious.     Allergies  Patient has no known allergies.  Home Medications   Prior to Admission medications   Medication Sig Start Date End Date Taking? Authorizing Provider  ferrous sulfate (FERROUSUL) 325 (65 FE) MG tablet Take 1 tablet (325 mg total) by mouth 2 (two) times daily. 03/31/16   Kathrynn RunningNoah Bedford Wouk, MD  ibuprofen (ADVIL,MOTRIN) 600 MG tablet Take 1 tablet (600 mg total) by mouth every 6 (six) hours. Patient not taking: Reported on 07/30/2016 05/24/16   Tilda BurrowJohn V Ferguson, MD  NIFEdipine (PROCARDIA-XL/ADALAT-CC/NIFEDICAL-XL) 30 MG 24 hr tablet Take 1 tablet (30 mg total) by mouth daily. 09/01/16 10/01/16  Dorena BodoLawrence Bonita Brindisi, NP  Prenat w/o A Vit-FeFum-FePo-FA (CONCEPT OB) 130-92.4-1 MG CAPS Take 1 capsule by mouth daily. 03/04/16   Wilmer FloorLisa A Leftwich-Kirby, CNM   Meds Ordered and Administered this Visit  Medications - No data to display  BP (!) 162/104 (BP Location: Right Arm)   Pulse 78   Temp 98.6 F (37 C) (Oral)   Resp 18   SpO2 100%   Breastfeeding? Yes  No data found.  Physical Exam  Constitutional: She is oriented to person, place, and time. She appears well-developed and well-nourished. No distress.  HENT:  Head: Normocephalic.  Right Ear:  External ear normal.  Left Ear: External ear normal.  Nose: Nose normal.  Mouth/Throat: Oropharynx is clear and moist.  Eyes: EOM are normal. Pupils are equal, round, and reactive to light. Right eye exhibits no discharge. Left eye exhibits no discharge.  Neck: Normal range of motion. Neck supple.  Cardiovascular: Normal rate, regular rhythm and normal heart sounds.   No murmur heard. Pulmonary/Chest: Effort normal and breath sounds normal. No respiratory distress. She has no wheezes.  Abdominal: Soft. Bowel sounds are normal. She exhibits no distension. There is no tenderness.  Musculoskeletal: Normal range of motion. She exhibits no edema.  Lymphadenopathy:    She has no cervical adenopathy.  Neurological: She is alert and oriented to person, place, and time.  Skin: Skin is warm and dry. Capillary refill takes less than 2 seconds. She is not diaphoretic. No pallor.  Psychiatric: She has a normal mood and affect. Her speech is normal and behavior is normal. Thought content normal. Her mood appears not anxious. She does not exhibit a depressed mood.  Nursing note and vitals reviewed.   Urgent Care Course   Clinical Course     Procedures (including critical care time)  Labs Review Labs Reviewed  POCT I-STAT, CHEM 8   Lab results reviewed and found to be normal.  Imaging Review No results found.   Visual Acuity Review  Right Eye Distance:   Left Eye Distance:   Bilateral Distance:    Right Eye Near:   Left Eye Near:    Bilateral Near:         MDM   1. Acute bilateral thoracic back pain   2. Essential hypertension    Continue Procardia 30 mg QD until seen by PCP for HTN,  Discussed barrier methods of contraception such as condoms for family planning until seen by PCP to discuss method of choice.  Discussed signs and symptoms of post partum depression and assessed mood. Discussed warning signs and advised to seek care should mood or affect change.  OTC Tylenol  for back pain management  Advised to follow-up with primary care for physical and management of chronic health concerns.      Dorena BodoLawrence Rc Amison, NP 09/01/16 1300

## 2016-09-01 NOTE — Discharge Instructions (Signed)
Follow up with primary care for management of hypertension. Continue Procardia as prescribed daily until seen by primary care. Family planning discussed with patient, advised use of barrier method until seen by primary care. Should signs or symptoms of post partum depression occur, follow up with primary care provider, or return to clinic.  For back pain, recommend OTC tylenol for symptom management.

## 2016-09-01 NOTE — ED Triage Notes (Signed)
Pt    Delivered     3  Months  Ago  And  Apparently     Had  htn  And  Was  On  Procardia    She  Is  Not on procardia  At this  Time     She  Reports   Some  Upper  Back  Pain intermittantly       As  Well  As  An intermittant  Headache      She  Is  Breast feeding    She is  Alert  And  Oriented  She  Has  No pcp

## 2016-10-06 ENCOUNTER — Encounter: Payer: Self-pay | Admitting: Obstetrics & Gynecology

## 2017-01-12 ENCOUNTER — Ambulatory Visit (HOSPITAL_COMMUNITY)
Admission: EM | Admit: 2017-01-12 | Discharge: 2017-01-12 | Disposition: A | Discharge status: Other Acute Inpatient Hospital | Payer: Medicaid Other

## 2017-03-05 IMAGING — US US MFM OB TRANSVAGINAL
1 series · 14 of 28 positions shown · non-contrast
Comparison: none

[Series 1: us mfm ob transvaginal · 115 acquisitions, 14 frames shown]
[im 5/115]
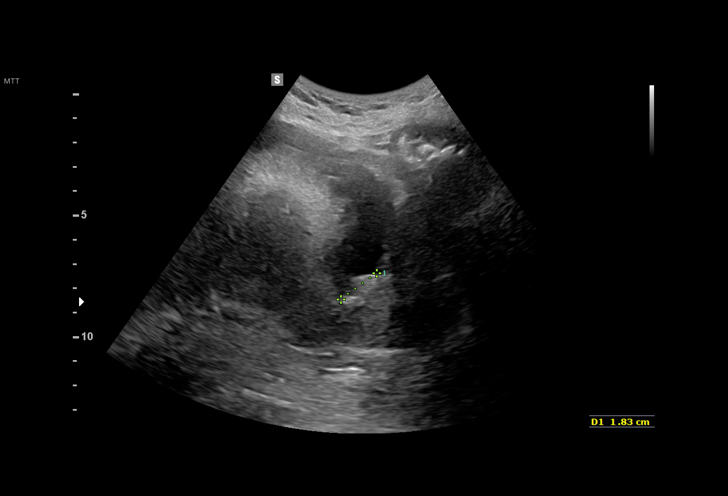
[im 13/115]
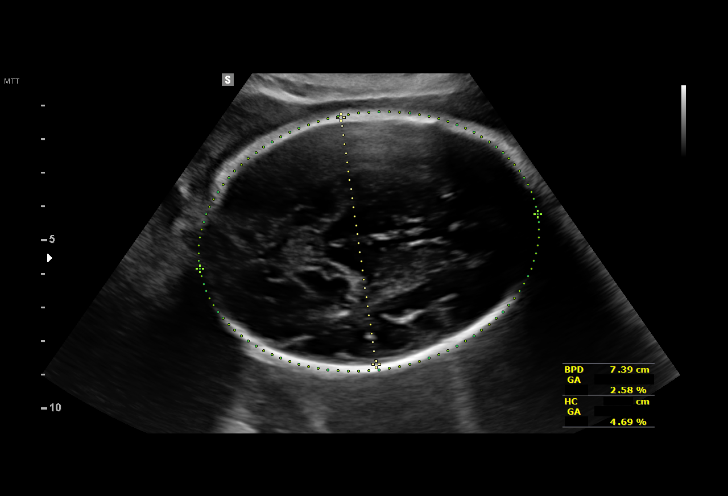
[im 22/115]
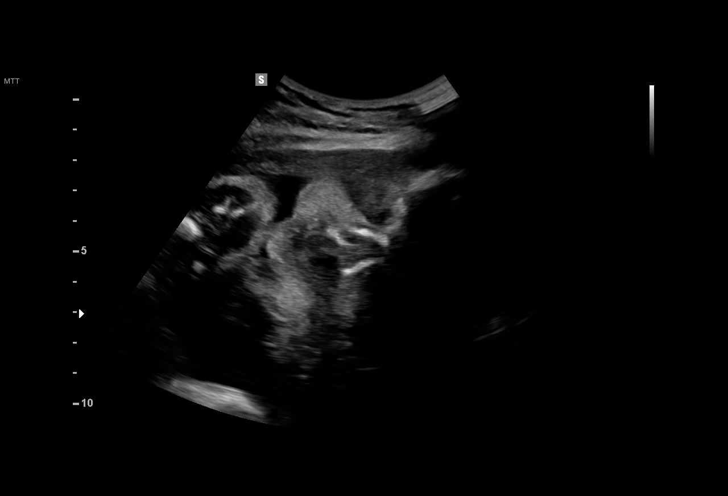
[im 30/115]
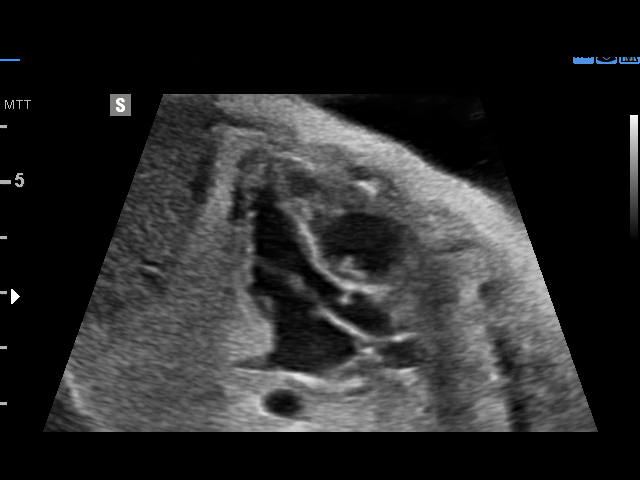
[im 39/115]
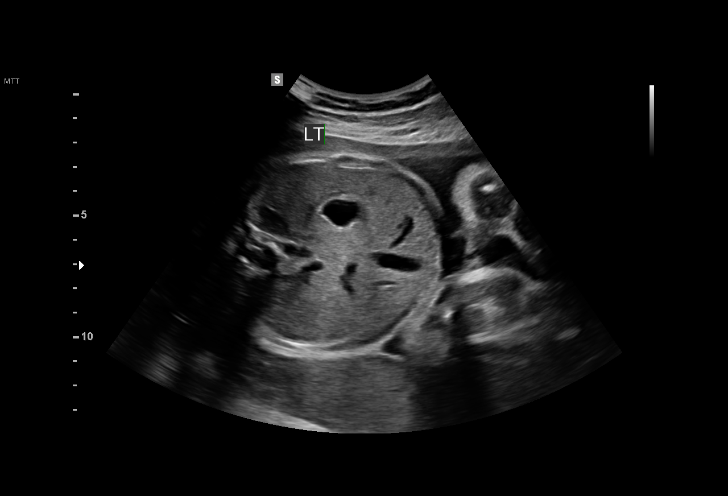
[im 47/115]
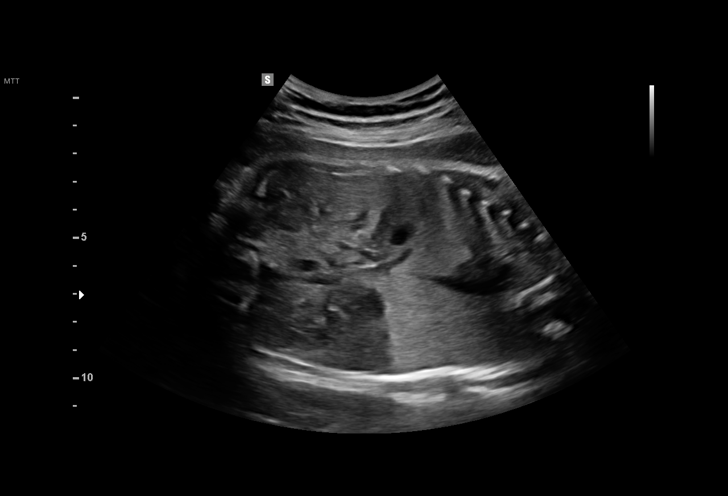
[im 55/115]
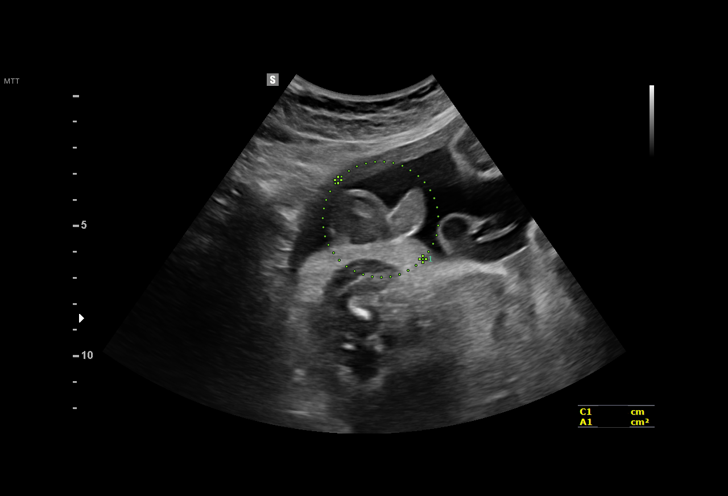
[im 64/115]
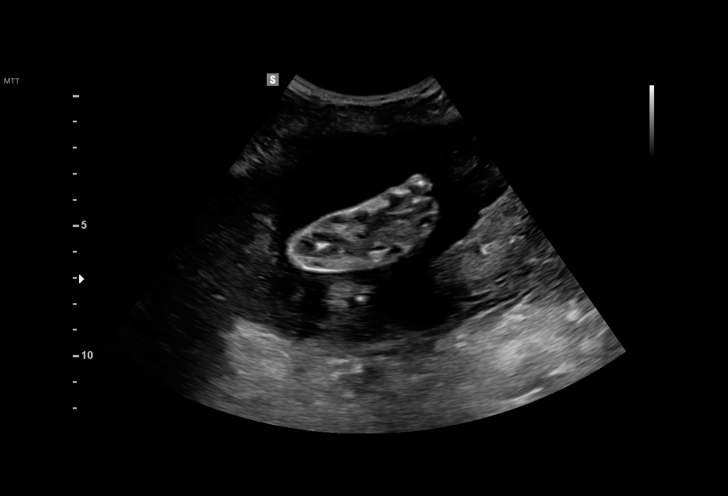
[im 72/115]
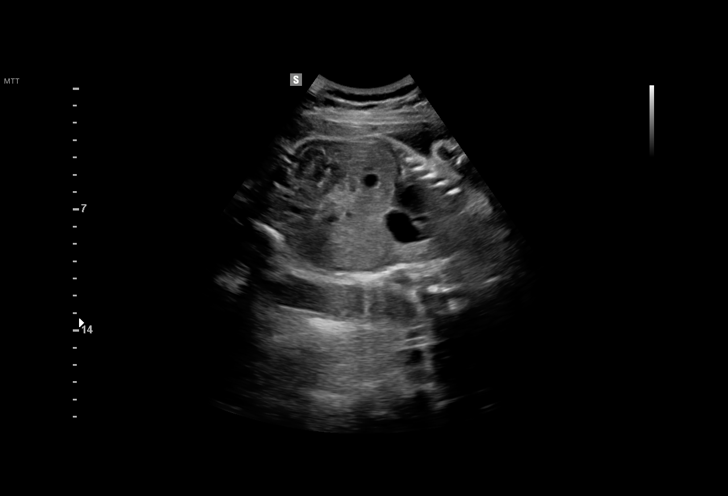
[im 81/115]
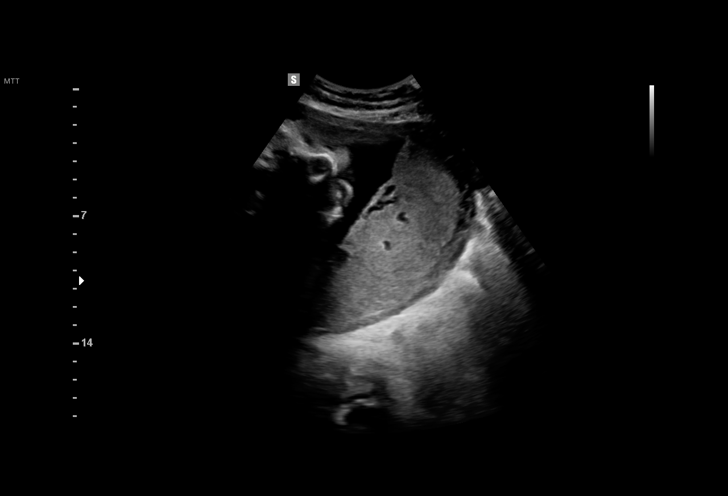
[im 89/115]
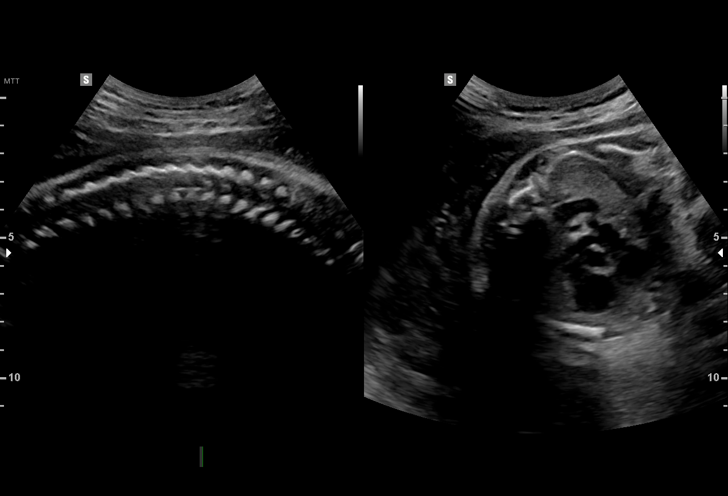
[im 98/115]
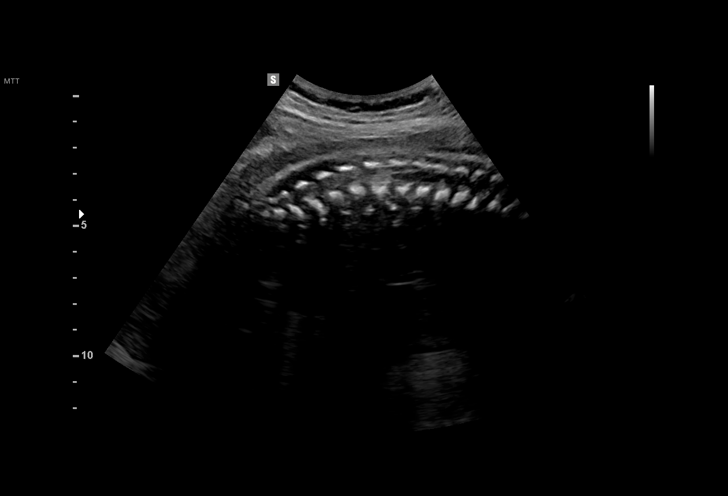
[im 106/115]
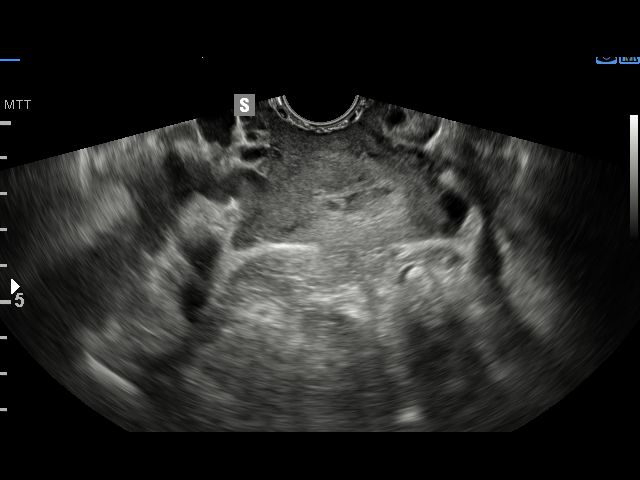
[im 115/115]
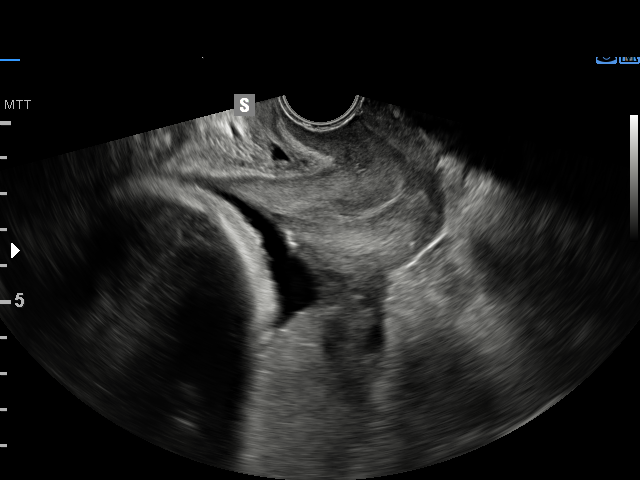

[14 of 28 positions shown; findings below may reference images not displayed]

[facility]yn Clinic
[REDACTED]-
Faculty Physician

1  NOMASIBULELE MOATSHE           [id number]      [id number]     [id number]
2  NOMASIBULELE MOATSHE           [id number]      [id number]     [id number]
Indications

31 weeks gestation of pregnancy
Follow-up incomplete fetal anatomic            Z36
evaluation
Placenta previa specified as without
hemorrhage, third trimester
History of sickle cell trait
OB History

Gravidity:    3         Term:   2        Prem:   0        SAB:   0
TOP:          0       Ectopic:  0        Living: 2
Fetal Evaluation

Num Of Fetuses:     1
Fetal Heart         140
Rate(bpm):
Cardiac Activity:   Observed
Presentation:       Cephalic
Placenta:           Posterior, above cervical os
P. Cord Insertion:  Previously Visualized

Amniotic Fluid
AFI FV:      Subjectively within normal limits

AFI Sum(cm)     %Tile       Largest Pocket(cm)
15.69           56
RUQ(cm)       RLQ(cm)       LUQ(cm)        LLQ(cm)
5.35
Biometry

BPD:      74.1  mm     G. Age:  29w 5d          3  %    CI:        70.35   %   70 - 86
FL/HC:      20.8   %   19.1 -
HC:      281.7  mm     G. Age:  30w 6d          5  %    HC/AC:      0.95       0.96 -
AC:      295.3  mm     G. Age:  33w 4d         91  %    FL/BPD:     79.2   %   71 - 87
FL:       58.7  mm     G. Age:  30w 5d         13  %    FL/AC:      19.9   %   20 - 24
HUM:      52.3  mm     G. Age:  30w 3d         26  %
CER:      38.1  mm     G. Age:  32w 4d         63  %
CM:        6.3  mm

Est. FW:    7318  gm      4 lb 3 oz     63  %
Gestational Age

LMP:           34w 1d       Date:   07/31/15                 EDD:   05/06/16
U/S Today:     31w 2d                                        EDD:   05/26/16
Best:          31w 5d    Det. By:   U/S  (12/04/15)          EDD:   05/23/16
Anatomy

Cranium:               Appears normal         Aortic Arch:            Appears normal
Cavum:                 Previously seen        Ductal Arch:            Appears normal
Ventricles:            Appears normal         Diaphragm:              Appears normal
Choroid Plexus:        Previously seen        Stomach:                Appears normal, left
sided
Cerebellum:            Previously seen        Abdomen:                Previously seen
Posterior Fossa:       Previously seen        Abdominal Wall:         Previously seen
Nuchal Fold:           Previously seen        Cord Vessels:           Previously seen
Face:                  Orbits and profile     Kidneys:                Appear normal
previously seen
Lips:                  Previously seen        Bladder:                Appears normal
Thoracic:              Previously seen        Spine:                  Appears normal
Heart:                 Appears normal         Upper Extremities:      Previously seen
(4CH, axis, and situs
RVOT:                  Appears normal         Lower Extremities:      Previously seen
LVOT:                  Appears normal

Other:  Male gender previously seen. Heels and 5th digit previously seen. .
Nasal bone previously seen.
Cervix Uterus Adnexa

Cervix
Length:            3.6  cm.
Normal appearance by transvaginal scan

Uterus
No abnormality visualized.

Left Ovary
No adnexal mass visualized.

Right Ovary
No adnexal mass visualized.

Cul De Sac:   No free fluid seen.
Adnexa:       No abnormality visualized.
Impression

SIUP at 31+5 weeks
Normal interval anatomy; anatomic survey complete
Normal amniotic fluid volume
Appropriate interval growth with EFW at the 63rd %tile
EV views of cervix: normal length without funneling; no previa

Recommendations

Follow-up as clinically indicated

## 2017-04-08 ENCOUNTER — Encounter (HOSPITAL_COMMUNITY): Payer: Self-pay | Admitting: Emergency Medicine

## 2017-04-08 ENCOUNTER — Ambulatory Visit (HOSPITAL_COMMUNITY)
Admission: EM | Admit: 2017-04-08 | Discharge: 2017-04-08 | Disposition: A | Discharge status: Home / Self Care | Payer: Medicaid Other

## 2017-04-08 DIAGNOSIS — M542 Cervicalgia: Secondary | ICD-10-CM | POA: Diagnosis not present

## 2017-04-08 DIAGNOSIS — M25519 Pain in unspecified shoulder: Secondary | ICD-10-CM

## 2017-04-08 MED ORDER — IBUPROFEN 800 MG PO TABS: 800.0000 mg | ORAL_TABLET | Freq: Three times a day (TID) | ORAL | 0 refills | Status: DC

## 2017-04-08 NOTE — ED Provider Notes (Signed)
CSN: [id number]     Arrival date & time 04/08/17  1300 History   None    Chief Complaint  Patient presents with  . Back Pain   (Consider location/radiation/quality/duration/timing/severity/associated sxs/prior Treatment) Patien c/o upper neck and upper back pain for 2 days   The history is provided by the patient.  Back Pain  Location:  Generalized Quality:  Aching Radiates to:  L shoulder and R shoulder Pain severity:  Mild Pain is:  Worse during the day Onset quality:  Sudden Duration:  2 days Timing:  Constant Chronicity:  New   Past Medical History:  Diagnosis Date  . AS (sickle cell trait) (HCC) 12/22/2012  . Hypertension    History reviewed. No pertinent surgical history. No family history on file. Social History  Substance Use Topics  . Smoking status: Never Smoker  . Smokeless tobacco: Never Used  . Alcohol use No   OB History    Gravida Para Term Preterm AB Living   3 3 3  0 0 3   SAB TAB Ectopic Multiple Live Births   0 0 0 0 3     Review of Systems  Constitutional: Negative.   HENT: Negative.   Eyes: Negative.   Respiratory: Negative.   Cardiovascular: Negative.   Endocrine: Negative.   Genitourinary: Negative.   Musculoskeletal: Positive for back pain.  Allergic/Immunologic: Negative.   Neurological: Negative.   Hematological: Negative.   Psychiatric/Behavioral: Negative.     Allergies  Patient has no known allergies.  Home Medications   Prior to Admission medications   Medication Sig Start Date End Date Taking? Authorizing Provider  acetaminophen (TYLENOL) 325 MG tablet Take 650 mg by mouth every 6 (six) hours as needed.   Yes [provider]  ferrous sulfate (FERROUSUL) 325 (65 FE) MG tablet Take 1 tablet (325 mg total) by mouth 2 (two) times daily. 03/31/16   Wouk, Wilfred CurtisNoah Bedford, MD  ibuprofen (ADVIL,MOTRIN) 600 MG tablet Take 1 tablet (600 mg total) by mouth every 6 (six) hours. Patient not taking: Reported on 07/30/2016  05/24/16   Tilda BurrowFerguson, John V, MD  ibuprofen (ADVIL,MOTRIN) 800 MG tablet Take 1 tablet (800 mg total) by mouth 3 (three) times daily. 04/08/17   Deatra Canterxford, William J, FNP  NIFEdipine (PROCARDIA-XL/ADALAT-CC/NIFEDICAL-XL) 30 MG 24 hr tablet Take 1 tablet (30 mg total) by mouth daily. 09/01/16 10/01/16  Dorena BodoKennard, Lawrence, NP  Prenat w/o A Vit-FeFum-FePo-FA (CONCEPT OB) 130-92.4-1 MG CAPS Take 1 capsule by mouth daily. 03/04/16   Leftwich-Kirby, Wilmer FloorLisa A, CNM   Meds Ordered and Administered this Visit  Medications - No data to display  BP (!) 149/99 (BP Location: Left Arm)   Pulse 79   Temp 98 F (36.7 C) (Oral)   Resp 19   LMP 04/03/2017   SpO2 100%  No data found.   Physical Exam  Constitutional: She appears well-developed and well-nourished.  HENT:  Head: Normocephalic and atraumatic.  Eyes: Pupils are equal, round, and reactive to light. Conjunctivae and EOM are normal.  Neck: Normal range of motion. Neck supple.  Cardiovascular: Normal rate, regular rhythm and normal heart sounds.   Pulmonary/Chest: Effort normal and breath sounds normal.  Abdominal: Soft. Bowel sounds are normal.  Musculoskeletal: She exhibits tenderness.  Tenderness to bilateral myofascial region and cervical paraspinous muscles  Nursing note and vitals reviewed.   Urgent Care Course     Procedures (including critical care time)  Labs Review Labs Reviewed - No data to display  Imaging Review No results  found.   Visual Acuity Review  Right Eye Distance:   Left Eye Distance:   Bilateral Distance:    Right Eye Near:   Left Eye Near:    Bilateral Near:         MDM   1. Cervicalgia   2. Arthralgia of shoulder, unspecified laterality    Motrin 800mg  one po tid #21      Deatra CanterOxford, William J, FNP 04/08/17 1339

## 2017-04-08 NOTE — ED Triage Notes (Addendum)
Patient is concerned for headache, neck pain and blood pressure running high.  Patient has noticed symptoms for 2-3 days  Patient is also breast feeding

## 2017-04-20 ENCOUNTER — Encounter: Payer: Self-pay | Admitting: Physician Assistant

## 2017-04-20 ENCOUNTER — Ambulatory Visit: Payer: Self-pay | Attending: Internal Medicine | Admitting: Physician Assistant

## 2017-04-20 VITALS — BP 138/84 | HR 75 | Temp 98.2°F | Resp 18 | Ht 63.0 in | Wt 143.0 lb

## 2017-04-20 DIAGNOSIS — R5383 Other fatigue: Secondary | ICD-10-CM | POA: Insufficient documentation

## 2017-04-20 DIAGNOSIS — Z79899 Other long term (current) drug therapy: Secondary | ICD-10-CM | POA: Insufficient documentation

## 2017-04-20 DIAGNOSIS — M542 Cervicalgia: Secondary | ICD-10-CM | POA: Insufficient documentation

## 2017-04-20 DIAGNOSIS — D573 Sickle-cell trait: Secondary | ICD-10-CM | POA: Insufficient documentation

## 2017-04-20 DIAGNOSIS — I10 Essential (primary) hypertension: Secondary | ICD-10-CM | POA: Insufficient documentation

## 2017-04-20 DIAGNOSIS — M25519 Pain in unspecified shoulder: Secondary | ICD-10-CM | POA: Insufficient documentation

## 2017-04-20 NOTE — Patient Instructions (Signed)
Check blood pressure daily and record and bring to next visit 

## 2017-04-20 NOTE — Progress Notes (Signed)
Patient ID: Holly Oconnor, female   DOB: December 25, 1980, 36 y.o.   MRN: [id number]     Holly Oconnor,Holly Oconnor is a 36 y.o. female  KGM:[id number]:[id number]  [id number]:[id number]  DOB - December 25, 1980  Subjective:  Chief Complaint and HPI: Holly Oconnor is a 36 y.o. female here today to establish care and for a follow up visit After being seen in the ED/urgent care 04/08/2017 for neck and shoulder pain. This is from carrying her infant/picking him up/ carrying car seat, etc. She was treated with Motrin 800mg  tid.  She is breast-feeding a 2410 month old infant.  After pregnancy, she was put on Nifedipine for htn.  This was subsequently stopped at 07/30/2016 post partum visit.  She has not had regular f/up/PCP.  She can check her BP at home but hasn't been checking it regularly.  She never had high BP prior to pregnancy and not during pregnancy-only post-partum.  She c/o fatigue but does have 410 month old infant that she is nurses 2-3 times daily in addition to nursing him throughout the night several times.    ED/Hospital notes reviewed.     ROS:   Constitutional:  No f/c, No night sweats, No unexplained weight loss. EENT:  No vision changes, No blurry vision, No hearing changes. No mouth, throat, or ear problems.  Respiratory: No cough, No SOB Cardiac: No CP, no palpitations GI:  No abd pain, No N/V/D. GU: No Urinary s/sx Musculoskeletal: No joint pain Neuro: No headache, no dizziness, no motor weakness.  Skin: No rash Endocrine:  No polydipsia. No polyuria.  Psych: Denies SI/HI  No problems updated.  ALLERGIES: No Known Allergies  PAST MEDICAL HISTORY: Past Medical History:  Diagnosis Date  . AS (sickle cell trait) (HCC) 12/22/2012  . Hypertension     MEDICATIONS AT HOME: Prior to Admission medications   Medication Sig Start Date End Date Taking? Authorizing Provider  acetaminophen (TYLENOL) 325 MG tablet Take 650 mg by mouth every 6 (six) hours as needed.   Yes [provider]  ibuprofen  (ADVIL,MOTRIN) 800 MG tablet Take 1 tablet (800 mg total) by mouth 3 (three) times daily. 04/08/17  Yes Deatra Canterxford, William J, FNP     Objective:  EXAM:   Vitals:   04/20/17 1439  BP: 138/84  Pulse: 75  Resp: 18  Temp: 98.2 F (36.8 C)  TempSrc: Oral  SpO2: 97%  Weight: 143 lb (64.9 kg)  Height: 5\' 3"  (1.6 m)    General appearance : A&OX3. NAD. Non-toxic-appearing HEENT: Atraumatic and Normocephalic.  PERRLA. EOM intact.  Neck: supple, no JVD. No cervical lymphadenopathy. No thyromegaly Chest/Lungs:  Breathing-non-labored, Good air entry bilaterally, breath sounds normal without rales, rhonchi, or wheezing  CVS: S1 S2 regular, no murmurs, gallops, rubs  B trapezius spasm Extremities: Bilateral Lower Ext shows no edema, both legs are warm to touch with = pulse throughout Neurology:  CN II-XII grossly intact, Non focal.   Psych:  TP linear. J/I WNL. Normal speech. Appropriate eye contact and affect.  Skin:  No Rash  Data Review No results found for: HGBA1C   Assessment & Plan   1. Essential hypertension Check blood pressure daily and record and bring to next visit.  She is currently breast-feeding.  Consider restarting Nifedipine - Basic metabolic panel  2. AS (sickle cell trait) (HCC)  3. Other fatigue See 3910 month old pediatrician to decide if continued night nursing is recommended at this age because this is keeping her from sleeping through the night -  Vitamin D, 25-hydroxy   Patient have been counseled extensively about nutrition and exercise  Return in about 2 weeks (around 05/04/2017) for assign PCP; decide on BP meds/breast-feeding.  The patient was given clear instructions to go to ER or return to medical center if symptoms don't improve, worsen or new problems develop. The patient verbalized understanding. The patient was told to call to get lab results if they haven't heard anything in the next week.     Georgian Co, PA-C [facility] [place], [place] [phone]   04/20/2017, 2:57 PM

## 2017-04-21 LAB — BASIC METABOLIC PANEL
BUN / CREAT RATIO: 19 (ref 9–23)
BUN: 13 mg/dL (ref 6–20)
CHLORIDE: 102 mmol/L (ref 96–106)
CO2: 23 mmol/L (ref 20–29)
CREATININE: 0.68 mg/dL (ref 0.57–1.00)
Calcium: 9.9 mg/dL (ref 8.7–10.2)
GFR calc Af Amer: 132 mL/min/{1.73_m2} (ref >59)
GFR calc non Af Amer: 114 mL/min/{1.73_m2} (ref >59)
Glucose: 83 mg/dL (ref 65–99)
Potassium: 4.1 mmol/L (ref 3.5–5.2)
SODIUM: 139 mmol/L (ref 134–144)

## 2017-04-21 LAB — VITAMIN D 25 HYDROXY (VIT D DEFICIENCY, FRACTURES): VIT D 25 HYDROXY: 17.6 ng/mL — AB (ref 30.0–100.0)

## 2017-04-23 ENCOUNTER — Telehealth: Payer: Self-pay

## 2017-04-23 NOTE — Telephone Encounter (Signed)
Contacted pt to go over lab results pt is aware of results and doesn't have any questions or concerns 

## 2017-04-30 ENCOUNTER — Ambulatory Visit: Payer: Self-pay | Attending: Internal Medicine

## 2017-05-05 ENCOUNTER — Ambulatory Visit: Payer: Self-pay | Admitting: Family Medicine

## 2017-05-10 ENCOUNTER — Ambulatory Visit: Payer: Self-pay | Attending: Family Medicine | Admitting: Family Medicine

## 2017-05-10 ENCOUNTER — Encounter: Payer: Self-pay | Admitting: Family Medicine

## 2017-05-10 VITALS — BP 144/84 | HR 87 | Temp 98.1°F | Resp 18 | Ht 62.0 in | Wt 146.8 lb

## 2017-05-10 DIAGNOSIS — E559 Vitamin D deficiency, unspecified: Secondary | ICD-10-CM | POA: Insufficient documentation

## 2017-05-10 DIAGNOSIS — Z13220 Encounter for screening for lipoid disorders: Secondary | ICD-10-CM

## 2017-05-10 DIAGNOSIS — R809 Proteinuria, unspecified: Secondary | ICD-10-CM

## 2017-05-10 DIAGNOSIS — I10 Essential (primary) hypertension: Secondary | ICD-10-CM | POA: Insufficient documentation

## 2017-05-10 LAB — POCT UA - MICROALBUMIN
CREATININE, POC: 50 mg/dL
Microalbumin Ur, POC: 10 mg/L

## 2017-05-10 MED ORDER — LABETALOL HCL 100 MG PO TABS: 100.0000 mg | ORAL_TABLET | Freq: Two times a day (BID) | ORAL | 2 refills | Status: DC

## 2017-05-10 MED ORDER — VITAMIN D 50 MCG (2000 UT) PO TABS: 2000.0000 [IU] | ORAL_TABLET | Freq: Every day | ORAL | 2 refills | Status: DC

## 2017-05-10 NOTE — Progress Notes (Signed)
Patient is here for BP

## 2017-05-10 NOTE — Progress Notes (Signed)
Subjective:  Patient ID: Holly Oconnor, female    DOB: 06/04/1981  Age: 36 y.o. MRN: [id number]  CC: Establish Care   HPI Holly Oconnor presents for hypertension. She is currently breastfeeding. Postpartum since September 2017. She is not exercising and is not adherent to low salt diet.  Blood pressure is not well controlled at home. BP: SBP 136-153 and DBP 61-109. Cardiac symptoms none. Patient denies chest pain, chest pressure/discomfort, claudication, dyspnea, lower extremity edema, near-syncope, palpitations and syncope.  Cardiovascular risk factors: hypertension and sedentary lifestyle. Use of agents associated with hypertension: none. History of target organ damage: none. History of vitamin d deficiency. She report not knowing which OTC dosage she should take.   Outpatient Medications Prior to Visit  Medication Sig Dispense Refill  . acetaminophen (TYLENOL) 325 MG tablet Take 650 mg by mouth every 6 (six) hours as needed.    Marland Kitchen ibuprofen (ADVIL,MOTRIN) 800 MG tablet Take 1 tablet (800 mg total) by mouth 3 (three) times daily. 21 tablet 0   No facility-administered medications prior to visit.     ROS Review of Systems  Constitutional: Negative.   Eyes: Negative.   Respiratory: Negative.   Cardiovascular: Negative.   Gastrointestinal: Negative.   Skin: Negative.   Neurological: Negative.   Psychiatric/Behavioral: Negative.    Objective:  BP (!) 144/84 (BP Location: Left Arm, Patient Position: Sitting, Cuff Size: Normal)   Pulse 87   Temp 98.1 F (36.7 C) (Oral)   Resp 18   Ht 5\' 2"  (1.575 m)   Wt 146 lb 12.8 oz (66.6 kg)   SpO2 98%   BMI 26.85 kg/m   BP/Weight 05/10/2017 04/20/2017 04/08/2017  Systolic BP 144 138 149  Diastolic BP 84 84 99  Wt. (Lbs) 146.8 143 -  BMI 26.85 25.33 -    Physical Exam  Constitutional: She appears well-developed and well-nourished.  Eyes: Pupils are equal, round, and reactive to light. Conjunctivae are normal.  Neck: No JVD present.    Cardiovascular: Normal rate, regular rhythm, normal heart sounds and intact distal pulses.   Pulmonary/Chest: Effort normal and breath sounds normal.  Abdominal: Soft. Bowel sounds are normal. There is no tenderness.  Skin: Skin is warm and dry.  Psychiatric: She has a normal mood and affect.  Nursing note and vitals reviewed.   Assessment & Plan:   Problem List Items Addressed This Visit      Cardiovascular and Mediastinum   Essential hypertension - Primary   Schedule BP recheck in 2 weeks with clinic RN.   If BP is greater than 90/60 (MAP 65 or greater) but not less than 130/80 may increase dose of  Labetalol to 200 mg BID and recheck in another 1 week with clinic RN.   Follow up with PCP in 2 months   Relevant Medications   labetalol (NORMODYNE) 100 MG tablet   Other Relevant Orders   POCT UA - Microalbumin (Completed)    Other Visit Diagnoses    Screening for lipid disorders       Relevant Orders   Lipid Panel   Vitamin D deficiency       Relevant Medications   Cholecalciferol (VITAMIN D) 2000 units tablet   Urine test positive for microalbuminuria           ACEI /ARB contraindicated in pregnancy. Goal to control BP then re- assess effectiveness on microalbumin.      Meds ordered this encounter  Medications  . labetalol (NORMODYNE) 100 MG tablet  Sig: Take 1 tablet (100 mg total) by mouth 2 (two) times daily.    Dispense:  60 tablet    Refill:  2    Order Specific Question:   Supervising Provider    Answer:   Quentin Angst [id number]  . Cholecalciferol (VITAMIN D) 2000 units tablet    Sig: Take 1 tablet (2,000 Units total) by mouth daily.    Dispense:  30 tablet    Refill:  2    Order Specific Question:   Supervising Provider    Answer:   Quentin Angst [id number]    Follow-up: Return in about 1 week (around 05/17/2017) for BP Check with Eustace Pen.   Lizbeth Bark FNP

## 2017-05-10 NOTE — Patient Instructions (Addendum)
Hypertension Hypertension is another name for high blood pressure. High blood pressure forces your heart to work harder to pump blood. This can cause problems over time. There are two numbers in a blood pressure reading. There is a top number (systolic) over a bottom number (diastolic). It is best to have a blood pressure below 120/80. Healthy choices can help lower your blood pressure. You may need medicine to help lower your blood pressure if:  Your blood pressure cannot be lowered with healthy choices.  Your blood pressure is higher than 130/80.  Follow these instructions at home: Eating and drinking  If directed, follow the DASH eating plan. This diet includes: ? Filling half of your plate at each meal with fruits and vegetables. ? Filling one quarter of your plate at each meal with whole grains. Whole grains include whole wheat pasta, brown rice, and whole grain bread. ? Eating or drinking low-fat dairy products, such as skim milk or low-fat yogurt. ? Filling one quarter of your plate at each meal with low-fat (lean) proteins. Low-fat proteins include fish, skinless chicken, eggs, beans, and tofu. ? Avoiding fatty meat, cured and processed meat, or chicken with skin. ? Avoiding premade or processed food.  Eat less than 1,500 mg of salt (sodium) a day.  Limit alcohol use to no more than 1 drink a day for nonpregnant women and 2 drinks a day for men. One drink equals 12 oz of beer, 5 oz of wine, or 1 oz of hard liquor. Lifestyle  Work with your doctor to stay at a healthy weight or to lose weight. Ask your doctor what the best weight is for you.  Get at least 30 minutes of exercise that causes your heart to beat faster (aerobic exercise) most days of the week. This may include walking, swimming, or biking.  Get at least 30 minutes of exercise that strengthens your muscles (resistance exercise) at least 3 days a week. This may include lifting weights or pilates.  Do not use any  products that contain nicotine or tobacco. This includes cigarettes and e-cigarettes. If you need help quitting, ask your doctor.  Check your blood pressure at home as told by your doctor.  Keep all follow-up visits as told by your doctor. This is important. Medicines  Take over-the-counter and prescription medicines only as told by your doctor. Follow directions carefully.  Do not skip doses of blood pressure medicine. The medicine does not work as well if you skip doses. Skipping doses also puts you at risk for problems.  Ask your doctor about side effects or reactions to medicines that you should watch for. Contact a doctor if:  You think you are having a reaction to the medicine you are taking.  You have headaches that keep coming back (recurring).  You feel dizzy.  You have swelling in your ankles.  You have trouble with your vision. Get help right away if:  You get a very bad headache.  You start to feel confused.  You feel weak or numb.  You feel faint.  You get very bad pain in your: ? Chest. ? Belly (abdomen).  You throw up (vomit) more than once.  You have trouble breathing. Summary  Hypertension is another name for high blood pressure.  Making healthy choices can help lower blood pressure. If your blood pressure cannot be controlled with healthy choices, you may need to take medicine. This information is not intended to replace advice given to you by your health care   provider. Make sure you discuss any questions you have with your health care provider. Document Released: 02/17/2008 Document Revised: 07/29/2016 Document Reviewed: 07/29/2016 Elsevier Interactive Patient Education  2018 Else[employer] Microalbumin Test Why am I having this test? Albumin is a protein in your body that helps regulate how much water is in your blood. As your kidneys filter your blood to get rid of waste products through your urine, albumin should remain in your bloodstream.  However, certain types of kidney disease can cause albumin to move from damaged blood vessels inside your kidneys and into your urine. When this happens, small amounts of albumin (microalbumin, MA) can be detected in your urine. This type of kidney damage is a common complication of diabetes mellitus, especially when blood sugar (glucose) has not been well controlled. The MA test may also help your health care provider diagnose other related medical conditions such as cardiovascular disease and high blood pressure. The MA test is often performed along with calculating urine creatinine levels. Creatinine is another waste product filtered out of your blood by your kidneys. You may have this test if:  You have diabetes and are showing signs of kidney damage.  Your health care provider wants to determine how well your blood glucose has been controlled over many years.  Your health care provider wants to determine your kidney function when other related tests are normal.  What kind of sample is taken? A urine sample is collected in a sterile container given to you by the lab. What are the reference values? Reference valuesare considered healthy valuesestablished after testing a large group of healthy people. Reference values may vary among different people, labs, and hospitals. It is your responsibility to obtain your test results. Ask the lab or department performing the test when and how you will get your results. A reference value for MA is any value less than 2 mg/L. A reference value for MA compared to creatinine is:  Men: less than 17 mg/g creatinine.  Women: less than 25 mg/g creatinine.  What do the results mean? MA test results that are higher than the reference values may indicate many health conditions. These may include:  Diabetes mellitus.  Poorly controlled diabetes mellitus.  Myoglobulinuria.  Hemoglobinuria.  Bence-Jones proteinuria.  Use of medicines or drugs that are  damaging to the kidneys.  Atherosclerosis.  Blood fat (lipid) abnormalities.  Insulin resistance.  High blood pressure.  Heart attack.  Talk with your health care provider to discuss your results, treatment options, and if necessary, the need for more tests. Talk with your health care provider if you have any questions about your results. Talk with your health care provider to discuss your results, treatment options, and if necessary, the need for more tests. Talk with your health care provider if you have any questions about your results. This information is not intended to replace advice given to you by your health care provider. Make sure you discuss any questions you have with your health care provider. Document Released: 10/03/2004 Document Revised: 05/06/2016 Document Reviewed: 01/19/2014 Elsevier Interactive Patient Education  2018 Elsevier Inc.  

## 2017-05-12 ENCOUNTER — Ambulatory Visit: Payer: Self-pay | Attending: Family Medicine

## 2017-05-12 DIAGNOSIS — Z13220 Encounter for screening for lipoid disorders: Secondary | ICD-10-CM | POA: Insufficient documentation

## 2017-05-12 NOTE — Progress Notes (Signed)
Patient here for lab visit only 

## 2017-05-13 LAB — LIPID PANEL
CHOL/HDL RATIO: 3.1 ratio (ref 0.0–4.4)
Cholesterol, Total: 152 mg/dL (ref 100–199)
HDL: 49 mg/dL (ref >39)
LDL CALC: 91 mg/dL (ref 0–99)
Triglycerides: 62 mg/dL (ref 0–149)
VLDL CHOLESTEROL CAL: 12 mg/dL (ref 5–40)

## 2017-05-18 ENCOUNTER — Telehealth: Payer: Self-pay

## 2017-05-18 NOTE — Telephone Encounter (Signed)
CMA call regarding lab results   Patient  Did not answer but left a VM stating the results & if have any questions just to call back

## 2017-05-18 NOTE — Telephone Encounter (Signed)
-----   Message from Lizbeth BarkMandesia R Hairston, FNP sent at 05/18/2017  7:14 AM EDT ----- Cholesterol levers are normal.

## 2017-05-21 ENCOUNTER — Other Ambulatory Visit: Payer: Self-pay | Admitting: Family Medicine

## 2017-05-21 ENCOUNTER — Ambulatory Visit: Payer: Self-pay | Attending: Family Medicine | Admitting: *Deleted

## 2017-05-21 VITALS — BP 150/90 | HR 72 | Temp 98.8°F

## 2017-05-21 DIAGNOSIS — I10 Essential (primary) hypertension: Secondary | ICD-10-CM

## 2017-05-21 DIAGNOSIS — Z048 Encounter for examination and observation for other specified reasons: Secondary | ICD-10-CM | POA: Insufficient documentation

## 2017-05-21 MED ORDER — METOPROLOL TARTRATE 25 MG PO TABS
25.0000 mg | ORAL_TABLET | Freq: Two times a day (BID) | ORAL | 2 refills | Status: DC
Start: 2017-05-21 — End: 2017-06-03

## 2017-05-21 NOTE — Progress Notes (Signed)
[facility] Pt alert and oriented and arrives in  good spirits. Last OV 05/10/2017 with  PCP.  Pt denies chest pain, SOB, HA, dizziness, or blurred vision. She complains that she is drowsy and has not been able to breast feed since being on medication. She has pain with feeding in left breast  For about 10 days. She denies fever.  Verified medication with patient.  Pt states medication was not taken today.  She last took medication last night.   Manual blood pressure reading: 150/90  PLAN: Will discontinue labetalol  Begin metoprolol Apply warm compress to breast. Take Tylenol as needed for pain. If this is helping, cancel appointment with Mrs. Hairston next week and schedule with nurse visit for BP check around 05/28/2017

## 2017-05-21 NOTE — Patient Instructions (Addendum)
Apply warm compress to breast. Take Tylenol as needed for pain. If this is helping, cancel appointment with Mrs. Hairston and schedule with nurse visit for BP check around 05/28/2017

## 2017-05-26 ENCOUNTER — Encounter: Payer: Self-pay | Admitting: Family Medicine

## 2017-05-26 ENCOUNTER — Ambulatory Visit: Payer: Self-pay | Attending: Family Medicine | Admitting: Family Medicine

## 2017-05-26 VITALS — BP 143/83 | HR 68 | Temp 97.8°F | Resp 18 | Ht 63.0 in | Wt 146.4 lb

## 2017-05-26 DIAGNOSIS — Z79899 Other long term (current) drug therapy: Secondary | ICD-10-CM | POA: Insufficient documentation

## 2017-05-26 DIAGNOSIS — I10 Essential (primary) hypertension: Secondary | ICD-10-CM | POA: Insufficient documentation

## 2017-05-26 DIAGNOSIS — Z791 Long term (current) use of non-steroidal anti-inflammatories (NSAID): Secondary | ICD-10-CM | POA: Insufficient documentation

## 2017-05-26 DIAGNOSIS — Z3009 Encounter for other general counseling and advice on contraception: Secondary | ICD-10-CM

## 2017-05-26 DIAGNOSIS — Z308 Encounter for other contraceptive management: Secondary | ICD-10-CM | POA: Insufficient documentation

## 2017-05-26 NOTE — Progress Notes (Signed)
   Subjective:  Patient ID: Holly Oconnor, female    DOB: Nov 30, 1980  Age: 36 y.o. MRN: [id number]  CC: Follow-up   HPI Holly Oconnor presents for hypertension follow up. She is not exercising and is not adherent to low salt diet.  She does not check BP at home. Cardiac symptoms none. Patient denies chest pain, chest pressure/discomfort, claudication, dyspnea, lower extremity edema, near-syncope, palpitations and syncope.  Cardiovascular risk factors: hypertension and sedentary lifestyle. Use of agents associated with hypertension: none. History of target organ damage: none. Recent history of labetalol use that was changed to metoprolol due c/o of side effect. Side effects included drowsiness. Metoprolol started. She reports taking metoprolol once daily only instead of BID as prescribed. She expresses interest in IUD placement. She denies any previous placements in the past.    Outpatient Medications Prior to Visit  Medication Sig Dispense Refill  . acetaminophen (TYLENOL) 325 MG tablet Take 650 mg by mouth every 6 (six) hours as needed.    . Cholecalciferol (VITAMIN D) 2000 units tablet Take 1 tablet (2,000 Units total) by mouth daily. 30 tablet 2  . ibuprofen (ADVIL,MOTRIN) 800 MG tablet Take 800 mg by mouth every 8 (eight) hours as needed.    . metoprolol tartrate (LOPRESSOR) 25 MG tablet Take 1 tablet (25 mg total) by mouth 2 (two) times daily. 30 tablet 2   No facility-administered medications prior to visit.     ROS Review of Systems  Constitutional: Negative.   Respiratory: Negative.   Cardiovascular: Negative.   Gastrointestinal: Negative.   Skin: Negative.   Neurological: Negative.    Objective:  BP (!) 143/83 (BP Location: Left Arm, Patient Position: Sitting, Cuff Size: Normal)   Pulse 68   Temp 97.8 F (36.6 C) (Oral)   Resp 18   Ht 5\' 3"  (1.6 m)   Wt 146 lb 6.4 oz (66.4 kg)   SpO2 99%   BMI 25.93 kg/m   BP/Weight 05/26/2017 05/21/2017 05/10/2017  Systolic BP 143 150 144   Diastolic BP 83 90 84  Wt. (Lbs) 146.4 - 146.8  BMI 25.93 - 26.85   Physical Exam  Constitutional: She appears well-developed and well-nourished.  Neck: No JVD present.  Cardiovascular: Normal rate, regular rhythm, normal heart sounds and intact distal pulses.   Pulmonary/Chest: Effort normal and breath sounds normal.  Abdominal: Soft. Bowel sounds are normal. There is no tenderness.  Skin: Skin is warm and dry.  Nursing note and vitals reviewed.    Assessment & Plan:   1. Essential hypertension Patient encouraged to take metoprolol BID for BP control. Schedule BP recheck in 1 week with clinic RN. If BP is greater than 90/60 (MAP 65 or greater) but not less than 130/80 may increase dose of  metoprolol to 50 mg BID and recheck in another 2 weeks with clinic RN.  2. Counseling for birth control regarding intrauterine device (IUD) Referral for IUD insertion - Ambulatory referral to Gynecology     Follow-up: Return in about 1 week (around 06/02/2017) for BP check with Travia.   Lizbeth BarkMandesia R Ioan Landini FNP

## 2017-05-26 NOTE — Patient Instructions (Addendum)
Please take all medications for blood pressure prior to office visit.   DASH Eating Plan DASH stands for "Dietary Approaches to Stop Hypertension." The DASH eating plan is a healthy eating plan that has been shown to reduce high blood pressure (hypertension). It may also reduce your risk for type 2 diabetes, heart disease, and stroke. The DASH eating plan may also help with weight loss. What are tips for following this plan? General guidelines  Avoid eating more than 2,300 mg (milligrams) of salt (sodium) a day. If you have hypertension, you may need to reduce your sodium intake to 1,500 mg a day.  Limit alcohol intake to no more than 1 drink a day for nonpregnant women and 2 drinks a day for men. One drink equals 12 oz of beer, 5 oz of wine, or 1 oz of hard liquor.  Work with your health care provider to maintain a healthy body weight or to lose weight. Ask what an ideal weight is for you.  Get at least 30 minutes of exercise that causes your heart to beat faster (aerobic exercise) most days of the week. Activities may include walking, swimming, or biking.  Work with your health care provider or diet and nutrition specialist (dietitian) to adjust your eating plan to your individual calorie needs. Reading food labels  Check food labels for the amount of sodium per serving. Choose foods with less than 5 percent of the Daily Value of sodium. Generally, foods with less than 300 mg of sodium per serving fit into this eating plan.  To find whole grains, look for the word "whole" as the first word in the ingredient list. Shopping  Buy products labeled as "low-sodium" or "no salt added."  Buy fresh foods. Avoid canned foods and premade or frozen meals. Cooking  Avoid adding salt when cooking. Use salt-free seasonings or herbs instead of table salt or sea salt. Check with your health care provider or pharmacist before using salt substitutes.  Do not fry foods. Cook foods using healthy methods  such as baking, boiling, grilling, and broiling instead.  Cook with heart-healthy oils, such as olive, canola, soybean, or sunflower oil. Meal planning   Eat a balanced diet that includes: ? 5 or more servings of fruits and vegetables each day. At each meal, try to fill half of your plate with fruits and vegetables. ? Up to 6-8 servings of whole grains each day. ? Less than 6 oz of lean meat, poultry, or fish each day. A 3-oz serving of meat is about the same size as a deck of cards. One egg equals 1 oz. ? 2 servings of low-fat dairy each day. ? A serving of nuts, seeds, or beans 5 times each week. ? Heart-healthy fats. Healthy fats called Omega-3 fatty acids are found in foods such as flaxseeds and coldwater fish, like sardines, salmon, and mackerel.  Limit how much you eat of the following: ? Canned or prepackaged foods. ? Food that is high in trans fat, such as fried foods. ? Food that is high in saturated fat, such as fatty meat. ? Sweets, desserts, sugary drinks, and other foods with added sugar. ? Full-fat dairy products.  Do not salt foods before eating.  Try to eat at least 2 vegetarian meals each week.  Eat more home-cooked food and less restaurant, buffet, and fast food.  When eating at a restaurant, ask that your food be prepared with less salt or no salt, if possible. What foods are recommended? The items  listed may not be a complete list. Talk with your dietitian about what dietary choices are best for you. Grains Whole-grain or whole-wheat bread. Whole-grain or whole-wheat pasta. Brown rice. Holly Oconnor. Bulgur. Whole-grain and low-sodium cereals. Pita bread. Low-fat, low-sodium crackers. Whole-wheat flour tortillas. Vegetables Fresh or frozen vegetables (raw, steamed, roasted, or grilled). Low-sodium or reduced-sodium tomato and vegetable juice. Low-sodium or reduced-sodium tomato sauce and tomato paste. Low-sodium or reduced-sodium canned vegetables. Fruits All  fresh, dried, or frozen fruit. Canned fruit in natural juice (without added sugar). Meat and other protein foods Skinless chicken or [place]. Ground chicken or [place]. Pork with fat trimmed off. Fish and seafood. Egg whites. Dried beans, peas, or lentils. Unsalted nuts, nut butters, and seeds. Unsalted canned beans. Lean cuts of beef with fat trimmed off. Low-sodium, lean deli meat. Dairy Low-fat (1%) or fat-free (skim) milk. Fat-free, low-fat, or reduced-fat cheeses. Nonfat, low-sodium ricotta or cottage cheese. Low-fat or nonfat yogurt. Low-fat, low-sodium cheese. Fats and oils Soft margarine without trans fats. Vegetable oil. Low-fat, reduced-fat, or light mayonnaise and salad dressings (reduced-sodium). Canola, safflower, olive, soybean, and sunflower oils. Avocado. Seasoning and other foods Herbs. Spices. Seasoning mixes without salt. Unsalted popcorn and pretzels. Fat-free sweets. What foods are not recommended? The items listed may not be a complete list. Talk with your dietitian about what dietary choices are best for you. Grains Baked goods made with fat, such as croissants, muffins, or some breads. Dry pasta or rice meal packs. Vegetables Creamed or fried vegetables. Vegetables in a cheese sauce. Regular canned vegetables (not low-sodium or reduced-sodium). Regular canned tomato sauce and paste (not low-sodium or reduced-sodium). Regular tomato and vegetable juice (not low-sodium or reduced-sodium). Holly Oconnor. Olives. Fruits Canned fruit in a light or heavy syrup. Fried fruit. Fruit in cream or butter sauce. Meat and other protein foods Fatty cuts of meat. Ribs. Fried meat. Holly Oconnor. Sausage. Bologna and other processed lunch meats. Salami. Fatback. Hotdogs. Bratwurst. Salted nuts and seeds. Canned beans with added salt. Canned or smoked fish. Whole eggs or egg yolks. Chicken or [place] with skin. Dairy Whole or 2% milk, cream, and half-and-half. Whole or full-fat cream cheese. Whole-fat or  sweetened yogurt. Full-fat cheese. Nondairy creamers. Whipped toppings. Processed cheese and cheese spreads. Fats and oils Butter. Stick margarine. Lard. Shortening. Ghee. Bacon fat. Tropical oils, such as coconut, palm kernel, or palm oil. Seasoning and other foods Salted popcorn and pretzels. Onion salt, garlic salt, seasoned salt, table salt, and sea salt. Worcestershire sauce. Tartar sauce. Barbecue sauce. Teriyaki sauce. Soy sauce, including reduced-sodium. Steak sauce. Canned and packaged gravies. Fish sauce. Oyster sauce. Cocktail sauce. Horseradish that you find on the shelf. Ketchup. Mustard. Meat flavorings and tenderizers. Bouillon cubes. Hot sauce and Tabasco sauce. Premade or packaged marinades. Premade or packaged taco seasonings. Relishes. Regular salad dressings. Where to find more information:  National Heart, Lung, and [place]: https://wilson-eaton.com/  American Heart Association: www.heart.org Summary  The DASH eating plan is a healthy eating plan that has been shown to reduce high blood pressure (hypertension). It may also reduce your risk for type 2 diabetes, heart disease, and stroke.  With the DASH eating plan, you should limit salt (sodium) intake to 2,300 mg a day. If you have hypertension, you may need to reduce your sodium intake to 1,500 mg a day.  When on the DASH eating plan, aim to eat more fresh fruits and vegetables, whole grains, lean proteins, low-fat dairy, and heart-healthy fats.  Work with your health care provider or  diet and nutrition specialist (dietitian) to adjust your eating plan to your individual calorie needs. This information is not intended to replace advice given to you by your health care provider. Make sure you discuss any questions you have with your health care provider. Document Released: 08/20/2011 Document Revised: 08/24/2016 Document Reviewed: 08/24/2016 Elsevier Interactive Patient Education  2017 [employer].

## 2017-05-26 NOTE — Progress Notes (Signed)
Patient is here for f/up breast pain

## 2017-06-03 ENCOUNTER — Ambulatory Visit: Payer: Self-pay | Attending: Family Medicine | Admitting: *Deleted

## 2017-06-03 VITALS — BP 148/90 | HR 68 | Resp 16

## 2017-06-03 DIAGNOSIS — I10 Essential (primary) hypertension: Secondary | ICD-10-CM | POA: Insufficient documentation

## 2017-06-03 MED ORDER — METOPROLOL TARTRATE 50 MG PO TABS: 50.0000 mg | ORAL_TABLET | Freq: Two times a day (BID) | ORAL | 0 refills | Status: DC

## 2017-06-03 NOTE — Progress Notes (Signed)
Pt arrived to [facility]. Pt alert and oriented.  Last OV 05/21/2017  with PCP. Blood pressure at last office visit was 143/83.  Pt denies chest pain, SOB, HA, dizziness, or blurred vision. She states medication makes her feel sleepy or drowsy. Reviewed  Medication with patient. Pt states medication was taken this morning.  Manual blood pressure reading:    If BP is greater than 90/60 (MAP 65 or greater) but not less than 130/80 may increase dose of  metoprolol to 50 mg BID and recheck in another 2 weeks with clinic RN.

## 2017-06-03 NOTE — Patient Instructions (Addendum)
If BP is greater than 90/60 (MAP 65 or greater) but not less than 130/80 may increase dose of  metoprolol to 50 mg twice a day and recheck in another 2 weeks with clinic RN.  Today your BP was not at goal.   You may take of the 25 mg twice daily. Your new dose of Metoprolol will be sent to the pharmacy.

## 2017-06-17 ENCOUNTER — Ambulatory Visit: Payer: Self-pay | Attending: Family Medicine | Admitting: *Deleted

## 2017-06-17 VITALS — BP 134/80 | HR 63

## 2017-06-17 DIAGNOSIS — Z0131 Encounter for examination of blood pressure with abnormal findings: Secondary | ICD-10-CM | POA: Insufficient documentation

## 2017-06-17 DIAGNOSIS — I10 Essential (primary) hypertension: Secondary | ICD-10-CM

## 2017-06-17 NOTE — Progress Notes (Signed)
Pt arrived to [facility]. Pt alert and oriented.  Last OV 05/21/2017  with PCP. Blood pressure at last office visit was 148/90.  Pt denies chest pain, SOB, HA, dizziness, or blurred vision.Reviewed  Medication with patient. Pt states medication was taken this morning.  Blood pressure: 139/83 and 134/80  Pt reminded to f/u with PCP from OV note 05/10/2017 in 2 mos.  Around  October.

## 2017-07-19 ENCOUNTER — Ambulatory Visit: Payer: Self-pay | Attending: Family Medicine | Admitting: *Deleted

## 2017-07-19 ENCOUNTER — Other Ambulatory Visit: Payer: Self-pay | Admitting: Family Medicine

## 2017-07-19 VITALS — BP 144/88 | HR 64 | Resp 16

## 2017-07-19 DIAGNOSIS — I10 Essential (primary) hypertension: Secondary | ICD-10-CM

## 2017-07-19 NOTE — Progress Notes (Signed)
Pt arrived to [facility]. Pt alert and oriented and arrives in good spirits.  Pt denies chest pain, SOB, HA, dizziness, or blurred vision.  Reviewed medication with patient. Pt states medication was taken this morning.  Manual blood pressure reading: 144/88 Per note, 05/26/2017, BP goal 130/80.pt verbalized understanding to BP goal. Advised patient to decrease salt intake. Increase exercise.   Will notify PCP.

## 2017-07-30 ENCOUNTER — Encounter: Payer: Self-pay | Admitting: Obstetrics & Gynecology

## 2017-08-07 ENCOUNTER — Other Ambulatory Visit: Payer: Self-pay | Admitting: Family Medicine

## 2017-08-07 DIAGNOSIS — I10 Essential (primary) hypertension: Secondary | ICD-10-CM

## 2017-08-26 ENCOUNTER — Ambulatory Visit: Payer: Self-pay | Attending: Family Medicine | Admitting: Family Medicine

## 2017-08-26 ENCOUNTER — Encounter: Payer: Self-pay | Admitting: Family Medicine

## 2017-08-26 VITALS — BP 129/84 | HR 61 | Temp 98.1°F | Resp 18 | Ht 63.0 in | Wt 144.8 lb

## 2017-08-26 DIAGNOSIS — Z79899 Other long term (current) drug therapy: Secondary | ICD-10-CM | POA: Insufficient documentation

## 2017-08-26 DIAGNOSIS — R253 Fasciculation: Secondary | ICD-10-CM

## 2017-08-26 DIAGNOSIS — H547 Unspecified visual loss: Secondary | ICD-10-CM

## 2017-08-26 DIAGNOSIS — I10 Essential (primary) hypertension: Secondary | ICD-10-CM | POA: Insufficient documentation

## 2017-08-26 DIAGNOSIS — H52539 Spasm of accommodation, unspecified eye: Secondary | ICD-10-CM | POA: Insufficient documentation

## 2017-08-26 DIAGNOSIS — H04123 Dry eye syndrome of bilateral lacrimal glands: Secondary | ICD-10-CM | POA: Insufficient documentation

## 2017-08-26 MED ORDER — POLYVINYL ALCOHOL 1.4 % OP SOLN: 1.0000 [drp] | OPHTHALMIC | 0 refills | Status: DC | PRN

## 2017-08-26 MED ORDER — CYCLOBENZAPRINE HCL 5 MG PO TABS: 5.0000 mg | ORAL_TABLET | Freq: Two times a day (BID) | ORAL | 0 refills | Status: DC | PRN

## 2017-08-26 MED ORDER — METOPROLOL TARTRATE 50 MG PO TABS: 50.0000 mg | ORAL_TABLET | Freq: Two times a day (BID) | ORAL | 2 refills | Status: DC

## 2017-08-26 NOTE — Progress Notes (Signed)
Subjective:  Patient ID: Holly Oconnor, female    DOB: 03-Jul-1981  Age: 36 y.o. MRN: [id number]  CC: Hypertension   HPI Holly Oconnor presents for hypertension follow up. She is not exercising and is not adherent to low salt diet.  She does not check BP at home.  She reports adherence with BP medications.  Cardiac symptoms none. Patient denies chest pain, chest pressure/discomfort, claudication, dyspnea, lower extremity edema, near-syncope, palpitations and syncope.  Cardiovascular risk factors: hypertension and sedentary lifestyle. Use of agents associated with hypertension: none. History of target organ damage: none.  Muscle twitches: Onset 2 months ago to the inferior right eye.  Symptoms are intermittent.  Associated symptoms include tearing.  She denies any eye redness, visual disturbances, or history of eye disorders.  Outpatient Medications Prior to Visit  Medication Sig Dispense Refill  . Cholecalciferol (VITAMIN D) 2000 units tablet Take 1 tablet (2,000 Units total) by mouth daily. 30 tablet 2  . metoprolol tartrate (LOPRESSOR) 50 MG tablet TAKE 1 TABLET BY MOUTH TWICE A DAY 60 tablet 2  . acetaminophen (TYLENOL) 325 MG tablet Take 650 mg by mouth every 6 (six) hours as needed.     No facility-administered medications prior to visit.     Review of Systems  Constitutional: Negative.   Eyes: Positive for discharge (watery). Negative for blurred vision and redness.       Eye twitches  Respiratory: Negative.   Cardiovascular: Negative.   Skin: Negative.     Objective:  BP 129/84 (BP Location: Left Arm, Patient Position: Sitting, Cuff Size: Normal)   Pulse 61   Temp 98.1 F (36.7 C) (Oral)   Resp 18   Ht 5\' 3"  (1.6 m)   Wt 144 lb 12.8 oz (65.7 kg)   SpO2 98%   BMI 25.65 kg/m   BP/Weight 08/26/2017 07/19/2017 06/17/2017  Systolic BP 129 144 134  Diastolic BP 84 88 80  Wt. (Lbs) 144.8 - -  BMI 25.65 - -   Physical Exam  Constitutional: She is well-developed,  well-nourished, and in no distress.  HENT:  Head: Normocephalic and atraumatic.  Right Ear: External ear normal.  Left Ear: External ear normal.  Nose: Nose normal.  Mouth/Throat: Oropharynx is clear and moist.  Eyes: Conjunctivae and EOM are normal. Pupils are equal, round, and reactive to light. Right eye exhibits no discharge. Left eye exhibits no discharge.  Cardiovascular: Normal rate, regular rhythm, normal heart sounds and intact distal pulses.  Pulmonary/Chest: Effort normal and breath sounds normal.  Abdominal: Soft. Bowel sounds are normal.  Psychiatric: Affect normal.  Nursing note and vitals reviewed.   Assessment & Plan:   1. Essential hypertension  - metoprolol tartrate (LOPRESSOR) 50 MG tablet; Take 1 tablet (50 mg total) by mouth 2 (two) times daily.  Dispense: 60 tablet; Refill: 2  2. Eye muscle twitches  - Ambulatory referral to Ophthalmology - cyclobenzaprine (FLEXERIL) 5 MG tablet; Take 1 tablet (5 mg total) by mouth 2 (two) times daily as needed for muscle spasms.  Dispense: 30 tablet; Refill: 0  3. Dry eyes  - polyvinyl alcohol (ARTIFICIAL TEARS) 1.4 % ophthalmic solution; Place 1 drop into both eyes as needed for dry eyes.  Dispense: 15 mL; Refill: 0  4. Decreased visual acuity Visual acuity screen performed. - Ambulatory referral to Ophthalmology      Follow-up: Return in about 3 months (around 11/24/2017), or if symptoms worsen or fail to improve, for HTN.   Holly BarkMandesia R Hairston FNP

## 2017-08-26 NOTE — Patient Instructions (Signed)
Blepharospasm  Blepharospasm is a sudden tightening (spasm) of the muscles around your eyes (orbicularis oculi). It causes attacks of abnormal and uncontrollable blinking that come and go without warning. This type of abnormal muscle movement is called dystonia.  Dystonia usually affects both eyes, but it does not affect other facial muscles or other parts of the body. Blepharospasm does not cause vision loss or lead to other serious physical problems.  What are the causes?  Blepharospasm is caused by an abnormality in a part of your brain that is called the basal ganglion. The exact cause of the abnormality is not known. Stress, fatigue, eye irritation, or bright light may trigger attacks of blepharospasm.  What increases the risk?  You may be at greater risk for blepharospasm if you:   Have a family history of blepharospasm.   Are female.   Are 50-70 years old.    What are the signs or symptoms?  Eye irritation and dryness are the first symptoms of blepharospasm. Your eyes may also feel tired or irritated when they are exposed to bright lights. As blepharospasm gets worse, uncontrollable blinking becomes more frequent. Other eye symptoms may include:   Uncontrolled and tight closing.   Watering.   Dryness.   Eye muscle pain.   Gritty sensation.   Sensitivity to light.    How is this diagnosed?  Your health care provider may diagnose blepharospasm based on your symptoms and your medical history. Your health care provider may also do a physical exam to confirm the diagnosis. There are no blood tests or other tests to help diagnose blepharospasm.  How is this treated?  There is no cure for blepharospasm. Treatments that are used to manage the condition may include:   Botulinum toxin injections. Botulinum toxin is a substance that is produced by bacteria that cause muscle paralysis. Injecting this toxin into the muscles around the eye may control blepharospasm for up to three months. Injections are done with  a tiny needle. They can be repeated as needed.   Medicines. These include muscle relaxants, sedatives, and medicines that are called anticholinergics. Treatment with medicine is less successful than using injections.   Surgery. If other treatments have not worked, you may need surgery to remove part of the orbicularis oculi muscles (myectomy).    Follow these instructions at home:  Several home care management strategies may help. You may have to try different techniques to find what works best for you. These may include:   Avoiding triggers that may bring on your attacks.   Resting and avoiding stress as much as possible.   Applying gentle pressure to the side of your eye or face. Sometimes this can stop an attack.   Doing a specific activity that can halt an attack. Examples include laughing, singing, yawning, and chewing.   Wearing dark glasses and a sun visor to protect your eyes from bright light.   Trying alternative treatments such as acupuncture, biofeedback, hypnosis, or meditation.   Going to sleep or taking a nap. Blepharospasm usually stops during sleep.    Contact a health care provider if:   Your attacks get worse or happen more often.   You are anxious or depressed because of your attacks.  This information is not intended to replace advice given to you by your health care provider. Make sure you discuss any questions you have with your health care provider.  Document Released: 09/03/2003 Document Revised: 02/06/2016 Document Reviewed: 04/24/2014  Elsevier Interactive Patient Education    2018 Elsevier Inc.

## 2017-08-26 NOTE — Progress Notes (Signed)
Patient is here for f/up HTN 

## 2017-11-22 ENCOUNTER — Ambulatory Visit: Payer: Self-pay | Attending: Family Medicine

## 2017-11-26 ENCOUNTER — Ambulatory Visit: Payer: Self-pay | Admitting: Family Medicine

## 2017-12-13 ENCOUNTER — Ambulatory Visit: Payer: Self-pay | Attending: Nurse Practitioner | Admitting: Nurse Practitioner

## 2017-12-13 ENCOUNTER — Encounter: Payer: Self-pay | Admitting: Nurse Practitioner

## 2017-12-13 VITALS — BP 136/87 | HR 82 | Temp 98.3°F | Ht 63.0 in | Wt 144.6 lb

## 2017-12-13 DIAGNOSIS — K219 Gastro-esophageal reflux disease without esophagitis: Secondary | ICD-10-CM

## 2017-12-13 DIAGNOSIS — R5383 Other fatigue: Secondary | ICD-10-CM | POA: Insufficient documentation

## 2017-12-13 DIAGNOSIS — E559 Vitamin D deficiency, unspecified: Secondary | ICD-10-CM

## 2017-12-13 DIAGNOSIS — K210 Gastro-esophageal reflux disease with esophagitis: Secondary | ICD-10-CM | POA: Insufficient documentation

## 2017-12-13 DIAGNOSIS — Z862 Personal history of diseases of the blood and blood-forming organs and certain disorders involving the immune mechanism: Secondary | ICD-10-CM

## 2017-12-13 DIAGNOSIS — I10 Essential (primary) hypertension: Secondary | ICD-10-CM | POA: Insufficient documentation

## 2017-12-13 DIAGNOSIS — D573 Sickle-cell trait: Secondary | ICD-10-CM

## 2017-12-13 MED ORDER — RANITIDINE HCL 300 MG PO TABS: 300.0000 mg | ORAL_TABLET | Freq: Every day | ORAL | 1 refills | Status: DC

## 2017-12-13 MED ORDER — OMEPRAZOLE 20 MG PO CPDR: 20.0000 mg | DELAYED_RELEASE_CAPSULE | Freq: Every day | ORAL | 3 refills | Status: DC

## 2017-12-13 NOTE — Patient Instructions (Signed)
Food Choices for Gastroesophageal Reflux Disease, Adult When you have gastroesophageal reflux disease (GERD), the foods you eat and your eating habits are very important. Choosing the right foods can help ease your discomfort. What guidelines do I need to follow?  Choose fruits, vegetables, whole grains, and low-fat dairy products.  Choose low-fat meat, fish, and poultry.  Limit fats such as oils, salad dressings, butter, nuts, and avocado.  Keep a food diary. This helps you identify foods that cause symptoms.  Avoid foods that cause symptoms. These may be different for everyone.  Eat small meals often instead of 3 large meals a day.  Eat your meals slowly, in a place where you are relaxed.  Limit fried foods.  Cook foods using methods other than frying.  Avoid drinking alcohol.  Avoid drinking large amounts of liquids with your meals.  Avoid bending over or lying down until 2-3 hours after eating. What foods are not recommended? These are some foods and drinks that may make your symptoms worse: Vegetables Tomatoes. Tomato juice. Tomato and spaghetti sauce. Chili peppers. Onion and garlic. Horseradish. Fruits Oranges, grapefruit, and lemon (fruit and juice). Meats High-fat meats, fish, and poultry. This includes hot dogs, ribs, ham, sausage, salami, and bacon. Dairy Whole milk and chocolate milk. Sour cream. Cream. Butter. Ice cream. Cream cheese. Drinks Coffee and tea. Bubbly (carbonated) drinks or energy drinks. Condiments Hot sauce. Barbecue sauce. Sweets/Desserts Chocolate and cocoa. Donuts. Peppermint and spearmint. Fats and Oils High-fat foods. This includes French fries and potato chips. Other Vinegar. Strong spices. This includes black pepper, white pepper, red pepper, cayenne, curry powder, cloves, ginger, and chili powder. The items listed above may not be a complete list of foods and drinks to avoid. Contact your dietitian for more information. This  information is not intended to replace advice given to you by your health care provider. Make sure you discuss any questions you have with your health care provider. Document Released: 03/01/2012 Document Revised: 02/06/2016 Document Reviewed: 07/05/2013 Elsevier Interactive Patient Education  2017 Elsevier Inc.  Gastroesophageal Reflux Disease, Adult Normally, food travels down the esophagus and stays in the stomach to be digested. If a person has gastroesophageal reflux disease (GERD), food and stomach acid move back up into the esophagus. When this happens, the esophagus becomes sore and swollen (inflamed). Over time, GERD can make small holes (ulcers) in the lining of the esophagus. Follow these instructions at home: Diet  Follow a diet as told by your doctor. You may need to avoid foods and drinks such as: ? Coffee and tea (with or without caffeine). ? Drinks that contain alcohol. ? Energy drinks and sports drinks. ? Carbonated drinks or sodas. ? Chocolate and cocoa. ? Peppermint and mint flavorings. ? Garlic and onions. ? Horseradish. ? Spicy and acidic foods, such as peppers, chili powder, curry powder, vinegar, hot sauces, and BBQ sauce. ? Citrus fruit juices and citrus fruits, such as oranges, lemons, and limes. ? Tomato-based foods, such as red sauce, chili, salsa, and pizza with red sauce. ? Fried and fatty foods, such as donuts, french fries, potato chips, and high-fat dressings. ? High-fat meats, such as hot dogs, rib eye steak, sausage, ham, and bacon. ? High-fat dairy items, such as whole milk, butter, and cream cheese.  Eat small meals often. Avoid eating large meals.  Avoid drinking large amounts of liquid with your meals.  Avoid eating meals during the 2-3 hours before bedtime.  Avoid lying down right after you eat.  Do   not exercise right after you eat. General instructions  Pay attention to any changes in your symptoms.  Take over-the-counter and prescription  medicines only as told by your doctor. Do not take aspirin, ibuprofen, or other NSAIDs unless your doctor says it is okay.  Do not use any tobacco products, including cigarettes, chewing tobacco, and e-cigarettes. If you need help quitting, ask your doctor.  Wear loose clothes. Do not wear anything tight around your waist.  Raise (elevate) the head of your bed about 6 inches (15 cm).  Try to lower your stress. If you need help doing this, ask your doctor.  If you are overweight, lose an amount of weight that is healthy for you. Ask your doctor about a safe weight loss goal.  Keep all follow-up visits as told by your doctor. This is important. Contact a doctor if:  You have new symptoms.  You lose weight and you do not know why it is happening.  You have trouble swallowing, or it hurts to swallow.  You have wheezing or a cough that keeps happening.  Your symptoms do not get better with treatment.  You have a hoarse voice. Get help right away if:  You have pain in your arms, neck, jaw, teeth, or back.  You feel sweaty, dizzy, or light-headed.  You have chest pain or shortness of breath.  You throw up (vomit) and your throw up looks like blood or coffee grounds.  You pass out (faint).  Your poop (stool) is bloody or black.  You cannot swallow, drink, or eat. This information is not intended to replace advice given to you by your health care provider. Make sure you discuss any questions you have with your health care provider. Document Released: 02/17/2008 Document Revised: 02/06/2016 Document Reviewed: 12/26/2014 Elsevier Interactive Patient Education  2018 Elsevier Inc.  

## 2017-12-13 NOTE — Progress Notes (Signed)
Assessment & Plan:  Holly Oconnor was seen today for establish care and fatigue.  Diagnoses and all orders for this visit:  Gastroesophageal reflux disease, esophagitis presence not specified -     Discontinue: omeprazole (PRILOSEC) 20 MG capsule; Take 1 capsule (20 mg total) by mouth daily. -     Lipid panel -     Vitamin B12 -     ranitidine (ZANTAC) 300 MG tablet; Take 1 tablet (300 mg total) by mouth at bedtime. INSTRUCTIONS: Avoid GERD Triggers: acidic, spicy or fried foods, caffeine, coffee, sodas,  alcohol and chocolate.    AS (sickle cell trait) (HCC) Stable  Vitamin D deficiency -     VITAMIN D 25 Hydroxy (Vit-D Deficiency, Fractures)  History of anemia -     CMP14+EGFR -     CBC    Patient has been counseled on age-appropriate routine health concerns for screening and prevention. These are reviewed and up-to-date. Referrals have been placed accordingly. Immunizations are up-to-date or declined.    Subjective:   Chief Complaint  Patient presents with  . Establish Care    Pt. is here to establish care.   . Fatigue    Pt. stated she feels tired lately and her mouth taste bitter.    HPI Holly Oconnor 37 y.o. female presents to office today to establish care.   GERD Paitent complains of bitter acidic taste in mouth. This has been associated with abdominal bloating, early satiety, fullness after meals and upper abdominal discomfort.  She denies bilious reflux, chest pain, difficulty swallowing, dysphagia and regurgitation of undigested food. Symptoms have been present for 1 month. She denies dysphagia.  She has not lost weight. She denies melena, hematochezia, hematemesis, and coffee ground emesis. Medical therapy in the past has included none.   She has a history of hypertension however she stopped taking metoprolol. Blood pressure is normal today. Will continue to monitor. Denies chest pain, shortness of breath, palpitations, lightheadedness, dizziness, headaches or BLE  edema.  BP Readings from Last 3 Encounters:  12/13/17 136/87  08/26/17 129/84  07/19/17 (!) 144/88    Review of Systems  Constitutional: Negative for fever, malaise/fatigue and weight loss.  HENT: Negative.  Negative for nosebleeds.   Eyes: Negative.  Negative for blurred vision, double vision and photophobia.  Respiratory: Negative.  Negative for cough and shortness of breath.   Cardiovascular: Negative.  Negative for chest pain, palpitations and leg swelling.  Gastrointestinal: Positive for heartburn. Negative for nausea and vomiting.  Musculoskeletal: Negative.  Negative for myalgias.  Neurological: Negative.  Negative for dizziness, focal weakness, seizures and headaches.  Psychiatric/Behavioral: Negative.  Negative for suicidal ideas.    Past Medical History:  Diagnosis Date  . AS (sickle cell trait) ([place]) 12/22/2012  . Hypertension     History reviewed. No pertinent surgical history.  Family History  Problem Relation Age of Onset  . Diabetes Mother   . Diabetes Father     Social History Reviewed with no changes to be made today.   Outpatient Medications Prior to Visit  Medication Sig Dispense Refill  . acetaminophen (TYLENOL) 325 MG tablet Take 650 mg by mouth every 6 (six) hours as needed.    . Cholecalciferol (VITAMIN D) 2000 units tablet Take 1 tablet (2,000 Units total) by mouth daily. (Patient not taking: Reported on 12/13/2017) 30 tablet 2  . cyclobenzaprine (FLEXERIL) 5 MG tablet Take 1 tablet (5 mg total) by mouth 2 (two) times daily as needed for muscle spasms. (Patient  not taking: Reported on 12/13/2017) 30 tablet 0  . metoprolol tartrate (LOPRESSOR) 50 MG tablet Take 1 tablet (50 mg total) by mouth 2 (two) times daily. (Patient not taking: Reported on 12/13/2017) 60 tablet 2  . polyvinyl alcohol (ARTIFICIAL TEARS) 1.4 % ophthalmic solution Place 1 drop into both eyes as needed for dry eyes. (Patient not taking: Reported on 12/13/2017) 15 mL 0   No  facility-administered medications prior to visit.     No Known Allergies     Objective:    BP 136/87 (BP Location: Left Arm, Patient Position: Sitting, Cuff Size: Normal)   Pulse 82   Temp 98.3 F (36.8 C) (Oral)   Ht 5' 3" (1.6 m)   Wt 144 lb 9.6 oz (65.6 kg)   LMP 12/10/2017   SpO2 99%   BMI 25.61 kg/m  Wt Readings from Last 3 Encounters:  12/13/17 144 lb 9.6 oz (65.6 kg)  08/26/17 144 lb 12.8 oz (65.7 kg)  05/26/17 146 lb 6.4 oz (66.4 kg)    Physical Exam  Constitutional: She is oriented to person, place, and time. She appears well-developed and well-nourished. She is cooperative.  HENT:  Head: Normocephalic and atraumatic.  Eyes: EOM are normal.  Neck: Normal range of motion.  Cardiovascular: Normal rate, regular rhythm, normal heart sounds and intact distal pulses. Exam reveals no gallop and no friction rub.  No murmur heard. Pulmonary/Chest: Effort normal and breath sounds normal. No tachypnea. No respiratory distress. She has no decreased breath sounds. She has no wheezes. She has no rhonchi. She has no rales. She exhibits no tenderness.  Abdominal: Soft. Bowel sounds are normal.  Musculoskeletal: Normal range of motion. She exhibits no edema.  Neurological: She is alert and oriented to person, place, and time. Coordination normal.  Skin: Skin is warm and dry.  Psychiatric: She has a normal mood and affect. Her behavior is normal. Judgment and thought content normal.  Nursing note and vitals reviewed.     Patient has been counseled extensively about nutrition and exercise as well as the importance of adherence with medications and regular follow-up. The patient was given clear instructions to go to ER or return to medical center if symptoms don't improve, worsen or new problems develop. The patient verbalized understanding.   Follow-up: Return in about 1 month (around 01/10/2018) for GERD.    W , FNP-BC [facility] [place], [place] [phone]   12/13/2017, 6:07 PM 

## 2017-12-14 LAB — CMP14+EGFR
ALT: 16 IU/L (ref 0–32)
AST: 19 IU/L (ref 0–40)
Albumin/Globulin Ratio: 1.7 (ref 1.2–2.2)
Albumin: 4.5 g/dL (ref 3.5–5.5)
Alkaline Phosphatase: 61 IU/L (ref 39–117)
BILIRUBIN TOTAL: 0.7 mg/dL (ref 0.0–1.2)
BUN/Creatinine Ratio: 14 (ref 9–23)
BUN: 8 mg/dL (ref 6–20)
CHLORIDE: 102 mmol/L (ref 96–106)
CO2: 22 mmol/L (ref 20–29)
Calcium: 9 mg/dL (ref 8.7–10.2)
Creatinine, Ser: 0.59 mg/dL (ref 0.57–1.00)
GFR calc non Af Amer: 119 mL/min/{1.73_m2} (ref >59)
GFR, EST AFRICAN AMERICAN: 137 mL/min/{1.73_m2} (ref >59)
GLUCOSE: 83 mg/dL (ref 65–99)
Globulin, Total: 2.7 g/dL (ref 1.5–4.5)
Potassium: 4 mmol/L (ref 3.5–5.2)
Sodium: 140 mmol/L (ref 134–144)
TOTAL PROTEIN: 7.2 g/dL (ref 6.0–8.5)

## 2017-12-14 LAB — LIPID PANEL
CHOLESTEROL TOTAL: 137 mg/dL (ref 100–199)
Chol/HDL Ratio: 3.6 ratio (ref 0.0–4.4)
HDL: 38 mg/dL — AB (ref >39)
LDL CALC: 86 mg/dL (ref 0–99)
TRIGLYCERIDES: 66 mg/dL (ref 0–149)
VLDL CHOLESTEROL CAL: 13 mg/dL (ref 5–40)

## 2017-12-14 LAB — VITAMIN D 25 HYDROXY (VIT D DEFICIENCY, FRACTURES): VIT D 25 HYDROXY: 31.3 ng/mL (ref 30.0–100.0)

## 2017-12-14 LAB — VITAMIN B12: Vitamin B-12: 445 pg/mL (ref 232–1245)

## 2017-12-14 LAB — CBC
HEMATOCRIT: 38.6 % (ref 34.0–46.6)
HEMOGLOBIN: 12.7 g/dL (ref 11.1–15.9)
MCH: 28.2 pg (ref 26.6–33.0)
MCHC: 32.9 g/dL (ref 31.5–35.7)
MCV: 86 fL (ref 79–97)
Platelets: 174 10*3/uL (ref 150–379)
RBC: 4.5 x10E6/uL (ref 3.77–5.28)
RDW: 13.8 % (ref 12.3–15.4)
WBC: 5 10*3/uL (ref 3.4–10.8)

## 2017-12-20 ENCOUNTER — Telehealth: Payer: Self-pay

## 2017-12-20 NOTE — Telephone Encounter (Signed)
CMA called patient to inform on lab results and PCP advising.  Patient verified DOB, and no questions or concerns.

## 2017-12-20 NOTE — Telephone Encounter (Signed)
-----   Message from Claiborne RiggZelda W Fleming, NP sent at 12/15/2017 10:25 PM EDT ----- All of your labs are essentially normal. INSTRUCTIONS: Work on a low fat, heart healthy diet and participate in regular aerobic exercise program by working out at least 150 minutes per week. No fried foods. No junk foods, sodas, sugary drinks, unhealthy snacking, alcohol or smoking.

## 2018-01-17 ENCOUNTER — Ambulatory Visit: Payer: Self-pay | Attending: Nurse Practitioner | Admitting: Nurse Practitioner

## 2018-01-17 ENCOUNTER — Encounter: Payer: Self-pay | Admitting: Nurse Practitioner

## 2018-01-17 VITALS — BP 150/90 | HR 75 | Temp 98.2°F | Ht 63.0 in | Wt 141.5 lb

## 2018-01-17 DIAGNOSIS — Z09 Encounter for follow-up examination after completed treatment for conditions other than malignant neoplasm: Secondary | ICD-10-CM | POA: Insufficient documentation

## 2018-01-17 DIAGNOSIS — K219 Gastro-esophageal reflux disease without esophagitis: Secondary | ICD-10-CM | POA: Insufficient documentation

## 2018-01-17 DIAGNOSIS — I10 Essential (primary) hypertension: Secondary | ICD-10-CM | POA: Insufficient documentation

## 2018-01-17 DIAGNOSIS — Z79899 Other long term (current) drug therapy: Secondary | ICD-10-CM | POA: Insufficient documentation

## 2018-01-17 DIAGNOSIS — D573 Sickle-cell trait: Secondary | ICD-10-CM | POA: Insufficient documentation

## 2018-01-17 MED ORDER — AMLODIPINE BESYLATE 5 MG PO TABS: 5.0000 mg | ORAL_TABLET | Freq: Every day | ORAL | 3 refills | Status: DC

## 2018-01-17 NOTE — Progress Notes (Signed)
Assessment & Plan:  Holly Oconnor was seen today for follow-up.  Diagnoses and all orders for this visit:  Gastroesophageal reflux disease, esophagitis presence not specified RESOLVED  Essential hypertension -     amLODipine (NORVASC) 5 MG tablet; Take 1 tablet (5 mg total) by mouth daily. Stopped metoprolol Continue all antihypertensives as prescribed.  Remember to bring in your blood pressure log with you for your follow up appointment.  DASH/Mediterranean Diets are healthier choices for HTN.   Patient has been counseled on age-appropriate routine health concerns for screening and prevention. These are reviewed and up-to-date. Referrals have been placed accordingly. Immunizations are up-to-date or declined.    Subjective:   Chief Complaint  Patient presents with  . Follow-up    Pt. is here follow-up on [place]. Patient stated her stomach and reflux are much better.    HPI Holly Oconnor 37 y.o. female presents to office today for follow up to GERD.  GERD She was seen in my office on 12-13-2017 with complaints of bitter acidic taste in mouth. Other symptoms included abdominal bloating, early satiety, fullness after meals and upper abdominal discomfort. At that time the onset of symptoms had been one month prior to office visit. I started her on ranitidine  daily at that time. Today she reports she has stopped taking the ranitidine and her reflux symptoms have resolved. Likely related to dietary modifications.   Essential Hypertension At her last office visit with me she had reportedly stopped taking metoprolol and BP was normal at that time. She has been on procardia XL during her pregnancy in the past and then was placed on labetalol post pregnancy and was switched to metoprolol due to unwanted side effects from labetalol. She was instructed to continue to monitor her blood pressure for changes. Today blood pressure is not well controlled. Will not start her back on beta blocker today.   Will start her on amlodipine today. Denies chest pain, shortness of breath, palpitations, lightheadedness, dizziness, headaches or BLE edema.  BP Readings from Last 3 Encounters:  01/17/18 (!) 150/90  12/13/17 136/87  08/26/17 129/84       Review of Systems  Constitutional: Negative for fever, malaise/fatigue and weight loss.  HENT: Negative.  Negative for nosebleeds.   Eyes: Negative.  Negative for blurred vision, double vision and photophobia.  Respiratory: Negative.  Negative for cough and shortness of breath.   Cardiovascular: Negative.  Negative for chest pain, palpitations and leg swelling.  Gastrointestinal: Negative.  Negative for heartburn, nausea and vomiting.  Musculoskeletal: Negative.  Negative for myalgias.  Neurological: Negative.  Negative for dizziness, focal weakness, seizures and headaches.  Psychiatric/Behavioral: Negative.  Negative for suicidal ideas.    Past Medical History:  Diagnosis Date  . AS (sickle cell trait) (HCC) 12/22/2012  . Hypertension     History reviewed. No pertinent surgical history.  Family History  Problem Relation Age of Onset  . Diabetes Mother   . Diabetes Father     Social History Reviewed with no changes to be made today.   Outpatient Medications Prior to Visit  Medication Sig Dispense Refill  . acetaminophen (TYLENOL) 325 MG tablet Take 650 mg by mouth every 6 (six) hours as needed.    . Cholecalciferol (VITAMIN D) 2000 units tablet Take 1 tablet (2,000 Units total) by mouth daily. (Patient not taking: Reported on 12/13/2017) 30 tablet 2  . polyvinyl alcohol (ARTIFICIAL TEARS) 1.4 % ophthalmic solution Place 1 drop into both eyes as needed for dry  eyes. (Patient not taking: Reported on 12/13/2017) 15 mL 0  . cyclobenzaprine (FLEXERIL) 5 MG tablet Take 1 tablet (5 mg total) by mouth 2 (two) times daily as needed for muscle spasms. (Patient not taking: Reported on 12/13/2017) 30 tablet 0  . metoprolol tartrate (LOPRESSOR) 50 MG tablet  Take 1 tablet (50 mg total) by mouth 2 (two) times daily. (Patient not taking: Reported on 12/13/2017) 60 tablet 2  . ranitidine (ZANTAC) 300 MG tablet Take 1 tablet (300 mg total) by mouth at bedtime. (Patient not taking: Reported on 01/17/2018) 30 tablet 1   No facility-administered medications prior to visit.     No Known Allergies     Objective:    BP (!) 150/90 (BP Location: Right Arm, Patient Position: Sitting, Cuff Size: Normal)   Pulse 75   Temp 98.2 F (36.8 C) (Oral)   Ht  (1.6 m)   Wt 141 lb 8 oz (64.2 kg)   SpO2 97%   BMI 25.07 kg/m  Wt Readings from Last 3 Encounters:  01/17/18 141 lb 8 oz (64.2 kg)  12/13/17 144 lb 9.6 oz (65.6 kg)  08/26/17 144 lb 12.8 oz (65.7 kg)    Physical Exam  Constitutional: She is oriented to person, place, and time. She appears well-developed and well-nourished. She is cooperative.  HENT:  Head: Normocephalic and atraumatic.  Eyes: EOM are normal.  Neck: Normal range of motion.  Cardiovascular: Normal rate, regular rhythm and normal heart sounds. Exam reveals no gallop and no friction rub.  No murmur heard. Pulmonary/Chest: Effort normal and breath sounds normal. No tachypnea. No respiratory distress. She has no decreased breath sounds. She has no wheezes. She has no rhonchi. She has no rales. She exhibits no tenderness.  Abdominal: Soft. Bowel sounds are normal.  Musculoskeletal: Normal range of motion. She exhibits no edema.  Neurological: She is alert and oriented to person, place, and time. Coordination normal.  Skin: Skin is warm and dry.  Psychiatric: She has a normal mood and affect. Her behavior is normal. Judgment and thought content normal.  Nursing note and vitals reviewed.      Patient has been counseled extensively about nutrition and exercise as well as the importance of adherence with medications and regular follow-up. The patient was given clear instructions to go to ER or return to medical center if symptoms don't  improve, worsen or new problems develop. The patient verbalized understanding.   Follow-up: Return in about 3 weeks (around 02/07/2018) for BP recheck.   Claiborne Rigg, FNP-BC [facility] [place], [place] [phone]   01/17/2018, 2:51 PM

## 2018-01-17 NOTE — Patient Instructions (Signed)

## 2018-02-08 ENCOUNTER — Ambulatory Visit: Payer: Self-pay | Attending: Family Medicine | Admitting: *Deleted

## 2018-02-08 VITALS — BP 136/84 | HR 73

## 2018-02-08 DIAGNOSIS — I10 Essential (primary) hypertension: Secondary | ICD-10-CM | POA: Insufficient documentation

## 2018-02-14 ENCOUNTER — Ambulatory Visit: Payer: Medicaid Other | Admitting: Nurse Practitioner

## 2018-02-16 NOTE — Progress Notes (Signed)
[facility] and oriented and arrives in good spirits. At last OV on 01/17/2018  with PCP, her blood pressure was 150/90. She was asked to return to office for blood pressure check.    Pt denies chest pain, SOB, HA, dizziness, or blurred vision.  Verified medication. Pt states she discontinued medication because she does not like to take medication  Over 1 week.  Pt encouraged to call office to speak to PCP for concerns she has with medication. Pt verbalized understanding and states she will continue to take medication going forward.    Blood pressure reading: 136/84

## 2018-03-23 ENCOUNTER — Ambulatory Visit: Payer: Medicaid Other

## 2018-03-28 ENCOUNTER — Ambulatory Visit: Payer: Medicaid Other | Attending: Nurse Practitioner

## 2018-05-09 ENCOUNTER — Encounter

## 2018-05-09 ENCOUNTER — Ambulatory Visit: Payer: Self-pay | Attending: Nurse Practitioner | Admitting: Nurse Practitioner

## 2018-05-09 ENCOUNTER — Encounter: Payer: Self-pay | Admitting: Nurse Practitioner

## 2018-05-09 DIAGNOSIS — I10 Essential (primary) hypertension: Secondary | ICD-10-CM | POA: Insufficient documentation

## 2018-05-09 DIAGNOSIS — Z79899 Other long term (current) drug therapy: Secondary | ICD-10-CM | POA: Insufficient documentation

## 2018-05-09 DIAGNOSIS — D573 Sickle-cell trait: Secondary | ICD-10-CM | POA: Insufficient documentation

## 2018-05-09 MED ORDER — AMLODIPINE BESYLATE 5 MG PO TABS: 5.0000 mg | ORAL_TABLET | Freq: Every day | ORAL | 3 refills | Status: DC

## 2018-05-09 MED FILL — ?AMLODIPINE BESYLATE 5 MG T: 5 MG | 30 days supply | Qty: 30 | Fill #0

## 2018-05-09 NOTE — Progress Notes (Signed)
Assessment & Plan:  Holly Oconnor was seen today for blood pressure check.  Diagnoses and all orders for this visit:  Essential hypertension -     amLODipine (NORVASC) 5 MG tablet; Take 1 tablet (5 mg total) by mouth daily.    Patient has been counseled on age-appropriate routine health concerns for screening and prevention. These are reviewed and up-to-date. Referrals have been placed accordingly. Immunizations are up-to-date or declined.    Subjective:   Chief Complaint  Patient presents with  . Blood Pressure Check    Pt. is here for blood pressure check. Pt. stated she have not take her blood pressure medicine for a while.    HPI Holly Oconnor 37 y.o. female presents to office today for blood pressure recheck.   ESSENTIAL HYPERTENSION Blood pressure is slightly elevated today however patient states she stopped taking her blood pressure medication after noticing her blood pressure had been normal. I instructed her that her blood pressure was normal because she had started taking amlodipine. I also instructed her that amlodipine is a medication that she will likely continue on to help keep her blood pressure normal and she should not stop taking it unless instructed by a health professional.  BP Readings from Last 3 Encounters:  05/09/18 (!) 143/92  02/08/18 136/84  01/17/18 (!) 150/90   Chest pain: no   Dyspnea: no   Claudication: no   Medication Side Effects  Lightheadedness: no   Urinary frequency: no   Edema: no   Impotence: no  Preventitive Healthcare:  Exercise: no   Diet Pattern: diet: general  Salt Restriction:  no  Review of Systems  Constitutional: Negative for fever, malaise/fatigue and weight loss.  HENT: Negative.  Negative for nosebleeds.   Eyes: Negative.  Negative for blurred vision, double vision and photophobia.  Respiratory: Negative.  Negative for cough and shortness of breath.   Cardiovascular: Negative.  Negative for chest pain, palpitations and leg  swelling.  Gastrointestinal: Negative.  Negative for heartburn, nausea and vomiting.  Musculoskeletal: Negative.  Negative for myalgias.  Neurological: Negative.  Negative for dizziness, focal weakness, seizures and headaches.  Psychiatric/Behavioral: Negative.  Negative for suicidal ideas.    Past Medical History:  Diagnosis Date  . AS (sickle cell trait) (HCC) 12/22/2012  . Hypertension     History reviewed. No pertinent surgical history.  Family History  Problem Relation Age of Onset  . Diabetes Mother   . Diabetes Father     Social History Reviewed with no changes to be made today.   Outpatient Medications Prior to Visit  Medication Sig Dispense Refill  . acetaminophen (TYLENOL) 325 MG tablet Take 650 mg by mouth every 6 (six) hours as needed.    . Cholecalciferol (VITAMIN D) 2000 units tablet Take 1 tablet (2,000 Units total) by mouth daily. 30 tablet 2  . polyvinyl alcohol (ARTIFICIAL TEARS) 1.4 % ophthalmic solution Place 1 drop into both eyes as needed for dry eyes. (Patient not taking: Reported on 02/08/2018) 15 mL 0  . amLODipine (NORVASC) 5 MG tablet Take 1 tablet (5 mg total) by mouth daily. (Patient not taking: Reported on 05/09/2018) 90 tablet 3   No facility-administered medications prior to visit.     No Known Allergies     Objective:    BP (!) 143/92 (BP Location: Left Arm, Patient Position: Sitting, Cuff Size: Normal)   Pulse 70   Temp 98.4 F (36.9 C) (Oral)   Ht 5\' 3"  (1.6 m)   Wt  139 lb 3.2 oz (63.1 kg)   LMP  (LMP Unknown)   SpO2 100%   BMI 24.66 kg/m  Wt Readings from Last 3 Encounters:  05/09/18 139 lb 3.2 oz (63.1 kg)  01/17/18 141 lb 8 oz (64.2 kg)  12/13/17 144 lb 9.6 oz (65.6 kg)    Physical Exam  Constitutional: She is oriented to person, place, and time. She appears well-developed and well-nourished. She is cooperative.  HENT:  Head: Normocephalic and atraumatic.  Eyes: EOM are normal.  Neck: Normal range of motion.    Cardiovascular: Normal rate, regular rhythm, normal heart sounds and intact distal pulses. Exam reveals no gallop and no friction rub.  No murmur heard. Pulmonary/Chest: Effort normal and breath sounds normal. No tachypnea. No respiratory distress. She has no decreased breath sounds. She has no wheezes. She has no rhonchi. She has no rales. She exhibits no tenderness.  Abdominal: Soft. Bowel sounds are normal.  Musculoskeletal: Normal range of motion. She exhibits no edema.  Neurological: She is alert and oriented to person, place, and time. Coordination normal.  Skin: Skin is warm and dry.  Psychiatric: She has a normal mood and affect. Her behavior is normal. Judgment and thought content normal.  Nursing note and vitals reviewed.        Patient has been counseled extensively about nutrition and exercise as well as the importance of adherence with medications and regular follow-up. The patient was given clear instructions to go to ER or return to medical center if symptoms don't improve, worsen or new problems develop. The patient verbalized understanding.   Follow-up: Return in about 3 weeks (around 05/30/2018) for PAP SMEAR, BP recheck.   Claiborne RiggZelda W Fleming, FNP-BC [facility] [place], [place] [phone]   05/09/2018, 2:16 PM

## 2018-05-31 ENCOUNTER — Ambulatory Visit: Payer: Self-pay | Attending: Nurse Practitioner | Admitting: Nurse Practitioner

## 2018-05-31 ENCOUNTER — Encounter: Payer: Self-pay | Admitting: Nurse Practitioner

## 2018-05-31 VITALS — BP 152/102 | HR 72 | Temp 98.4°F | Ht 63.0 in | Wt 139.4 lb

## 2018-05-31 DIAGNOSIS — I10 Essential (primary) hypertension: Secondary | ICD-10-CM | POA: Insufficient documentation

## 2018-05-31 DIAGNOSIS — Z124 Encounter for screening for malignant neoplasm of cervix: Secondary | ICD-10-CM

## 2018-05-31 DIAGNOSIS — Z79899 Other long term (current) drug therapy: Secondary | ICD-10-CM | POA: Insufficient documentation

## 2018-05-31 DIAGNOSIS — D573 Sickle-cell trait: Secondary | ICD-10-CM | POA: Insufficient documentation

## 2018-05-31 NOTE — Progress Notes (Signed)
Assessment & Plan:  Holly Oconnor was seen today for gynecologic exam.  Diagnoses and all orders for this visit:  Encounter for Papanicolaou smear for cervical cancer screening -     Cytology - PAP    Patient has been counseled on age-appropriate routine health concerns for screening and prevention. These are reviewed and up-to-date. Referrals have been placed accordingly. Immunizations are up-to-date or declined.    Subjective:   Chief Complaint  Patient presents with  . Gynecologic Exam    Pt. is here for pap smear.    HPI Holly Oconnor 37 y.o. female presents to office today for PAP. Her last pap smear was 2017 however endocervical zone component was negative.   Review of Systems  Constitutional: Negative.  Negative for chills, fever, malaise/fatigue and weight loss.  Respiratory: Negative.  Negative for cough, shortness of breath and wheezing.   Cardiovascular: Negative.  Negative for chest pain, orthopnea and leg swelling.  Gastrointestinal: Positive for heartburn. Negative for abdominal pain.  Genitourinary: Negative.  Negative for flank pain.  Skin: Negative.  Negative for rash.  Psychiatric/Behavioral: Negative for suicidal ideas.    Past Medical History:  Diagnosis Date  . AS (sickle cell trait) (HCC) 12/22/2012  . Hypertension     History reviewed. No pertinent surgical history.  Family History  Problem Relation Age of Onset  . Diabetes Mother   . Diabetes Father     Social History Reviewed with no changes to be made today.   Outpatient Medications Prior to Visit  Medication Sig Dispense Refill  . acetaminophen (TYLENOL) 325 MG tablet Take 650 mg by mouth every 6 (six) hours as needed.    Marland Kitchen. amLODipine (NORVASC) 5 MG tablet Take 1 tablet (5 mg total) by mouth daily. 90 tablet 3  . Cholecalciferol (VITAMIN D) 2000 units tablet Take 1 tablet (2,000 Units total) by mouth daily. 30 tablet 2  . polyvinyl alcohol (ARTIFICIAL TEARS) 1.4 % ophthalmic solution Place 1  drop into both eyes as needed for dry eyes. (Patient not taking: Reported on 02/08/2018) 15 mL 0   No facility-administered medications prior to visit.     No Known Allergies     Objective:    BP (!) 152/102 (BP Location: Right Arm, Patient Position: Sitting, Cuff Size: Normal)   Pulse 72   Temp 98.4 F (36.9 C) (Oral)   Ht 5\' 3"  (1.6 m)   Wt 139 lb 6.4 oz (63.2 kg)   LMP 05/20/2018   SpO2 97%   BMI 24.69 kg/m  Wt Readings from Last 3 Encounters:  05/31/18 139 lb 6.4 oz (63.2 kg)  05/09/18 139 lb 3.2 oz (63.1 kg)  01/17/18 141 lb 8 oz (64.2 kg)    Physical Exam  Constitutional: She is oriented to person, place, and time. She appears well-developed and well-nourished.  HENT:  Head: Normocephalic.  Cardiovascular: Normal rate, regular rhythm and normal heart sounds.  Pulmonary/Chest: Effort normal and breath sounds normal.  Abdominal: Soft. Bowel sounds are normal. Hernia confirmed negative in the right inguinal area and confirmed negative in the left inguinal area.  Genitourinary: Rectum normal, vagina normal and uterus normal. Rectal exam shows no external hemorrhoid. No labial fusion. There is no rash, tenderness, lesion or injury on the right labia. There is no rash, tenderness, lesion or injury on the left labia. Uterus is not deviated and not enlarged. Cervix exhibits no motion tenderness and no friability. Right adnexum displays no mass, no tenderness and no fullness. Left adnexum displays  no mass, no tenderness and no fullness. No erythema, tenderness or bleeding in the vagina. No foreign body in the vagina. No signs of injury around the vagina. No vaginal discharge found.  Lymphadenopathy: No inguinal adenopathy noted on the right or left side.       Right: No inguinal adenopathy present.       Left: No inguinal adenopathy present.  Neurological: She is alert and oriented to person, place, and time.  Skin: Skin is warm and dry.  Psychiatric: She has a normal mood and  affect. Her behavior is normal. Judgment and thought content normal.         Patient has been counseled extensively about nutrition and exercise as well as the importance of adherence with medications and regular follow-up. The patient was given clear instructions to go to ER or return to medical center if symptoms don't improve, worsen or new problems develop. The patient verbalized understanding.   Follow-up: Return if symptoms worsen or fail to improve.   Claiborne Rigg, FNP-BC [facility] [place], [place] [phone]   05/31/2018, 2:32 PM

## 2018-05-31 NOTE — Patient Instructions (Signed)
Your Pap smear test will include: Gonorrhea Chlamydia HPV Bacterial Vaginosis Yeast   Pap Test Why am I having this test? A pap test is sometimes called a pap smear. It is a screening test that is used to check for signs of cancer of the vagina, cervix, and uterus. The test can also identify the presence of infection or precancerous changes. Your health care provider will likely recommend you have this test done on a regular basis. This test may be done:  Every 3 years, starting at age 36.  Every 5 years, in combination with testing for the presence of human papillomavirus (HPV).  More or less often depending on other medical conditions.  What kind of sample is taken? Using a small cotton swab, plastic spatula, or brush, your health care provider will collect a sample of cells from the surface of your cervix. Your cervix is the opening to your uterus, also called a womb. Secretions from the cervix and vagina may also be collected. How do I prepare for this test?  Be aware of where you are in your menstrual cycle. You may be asked to reschedule the test if you are menstruating on the day of the test.  You may need to reschedule if you have a known vaginal infection on the day of the test.  You may be asked to avoid douching or taking a bath the day before or the day of the test.  Some medicines can cause abnormal test results, such as digitalis and tetracycline. Talk with your health care provider before your test if you take one of these medicines. What do the results mean? Abnormal test results may indicate a number of health conditions. These may include:  Cancer. Although pap test results cannot be used to diagnose cancer of the cervix, vagina, or uterus, they may suggest the possibility of cancer. Further tests would be required to determine if cancer is present.  Sexually transmitted disease.  Fungal infection.  Parasite infection.  Herpes infection.  A condition  causing or contributing to infertility.  It is your responsibility to obtain your test results. Ask the lab or department performing the test when and how you will get your results. Contact your health care provider to discuss any questions you have about your results. Talk with your health care provider to discuss your results, treatment options, and if necessary, the need for more tests. Talk with your health care provider if you have any questions about your results. This information is not intended to replace advice given to you by your health care provider. Make sure you discuss any questions you have with your health care provider. Document Released: 11/21/2002 Document Revised: 05/06/2016 Document Reviewed: 01/22/2014 Elsevier Interactive Patient Education  [employer]2018 Elsevier Inc.

## 2018-06-02 LAB — CYTOLOGY - PAP
Bacterial vaginitis: NEGATIVE
CANDIDA VAGINITIS: NEGATIVE
CHLAMYDIA, DNA PROBE: NEGATIVE
Diagnosis: NEGATIVE
HPV (WINDOPATH): NOT DETECTED
NEISSERIA GONORRHEA: NEGATIVE
Trichomonas: NEGATIVE

## 2018-06-03 ENCOUNTER — Telehealth: Payer: Self-pay

## 2018-06-03 NOTE — Telephone Encounter (Signed)
-----   Message from Claiborne RiggZelda W Fleming, NP sent at 06/03/2018 12:04 AM EDT ----- PAP smear is normal. Will repeat in 3 years.

## 2018-06-03 NOTE — Telephone Encounter (Signed)
CMA attempt to reach patient to inform on lab results.  No answer and left a VM for patient to call back.  If patient call back, please inform:  PAP smear is normal. Will repeat in 3 years.

## 2018-06-06 ENCOUNTER — Telehealth: Payer: Self-pay | Admitting: Nurse Practitioner

## 2018-07-18 ENCOUNTER — Ambulatory Visit: Payer: Self-pay | Attending: Nurse Practitioner | Admitting: Nurse Practitioner

## 2018-07-18 ENCOUNTER — Encounter: Payer: Self-pay | Admitting: Nurse Practitioner

## 2018-07-18 DIAGNOSIS — Z9114 Patient's other noncompliance with medication regimen: Secondary | ICD-10-CM | POA: Insufficient documentation

## 2018-07-18 DIAGNOSIS — D573 Sickle-cell trait: Secondary | ICD-10-CM | POA: Insufficient documentation

## 2018-07-18 DIAGNOSIS — I10 Essential (primary) hypertension: Secondary | ICD-10-CM | POA: Insufficient documentation

## 2018-07-18 MED ORDER — AMLODIPINE BESYLATE 10 MG PO TABS: 10.0000 mg | ORAL_TABLET | Freq: Every day | ORAL | 3 refills | Status: DC

## 2018-07-18 NOTE — Progress Notes (Signed)
Assessment & Plan:  Holly Oconnor was seen today for follow-up.  Diagnoses and all orders for this visit:  Essential hypertension -     amLODipine (NORVASC) 10 MG tablet; Take 1 tablet (10 mg total) by mouth daily. Continue all antihypertensives as prescribed.  Remember to bring in your blood pressure log with you for your follow up appointment.  DASH/Mediterranean Diets are healthier choices for HTN.  F/U 3 weeks BP recheck  Patient has been counseled on age-appropriate routine health concerns for screening and prevention. These are reviewed and up-to-date. Referrals have been placed accordingly. Immunizations are up-to-date or declined.    Subjective:   Chief Complaint  Patient presents with  . Follow-up    Pt. is here for a blood pressure check. Pt. would like to talk to PCP regarding birth control.    HPI Holly Oconnor 37 y.o. female presents to office today for BP recheck.   Essential Hypertension She endorses medication noncompliance. Missing doses of her amlodipine. Reports frequent headaches likely related to her poorly controlled HTN. She is not diet compliant in regards to sodium restriction. I have instructed her to take her amlodipine as prescribed and will increase from 5mg  to 10 mg today. She denies chest pain, shortness of breath, palpitations, lightheadedness, dizziness, headaches or BLE edema.   BP Readings from Last 3 Encounters:  07/18/18 (!) 141/89  05/31/18 (!) 152/102  05/09/18 (!) 143/92    Review of Systems  Constitutional: Negative for fever, malaise/fatigue and weight loss.  HENT: Negative.  Negative for nosebleeds.   Eyes: Negative.  Negative for blurred vision, double vision and photophobia.  Respiratory: Negative.  Negative for cough and shortness of breath.   Cardiovascular: Negative.  Negative for chest pain, palpitations and leg swelling.  Gastrointestinal: Negative.  Negative for heartburn, nausea and vomiting.  Musculoskeletal: Negative.  Negative  for myalgias.  Neurological: Positive for headaches. Negative for dizziness, focal weakness and seizures.  Psychiatric/Behavioral: Negative.  Negative for suicidal ideas.    Past Medical History:  Diagnosis Date  . AS (sickle cell trait) (HCC) 12/22/2012  . Hypertension     History reviewed. No pertinent surgical history.  Family History  Problem Relation Age of Onset  . Diabetes Mother   . Diabetes Father     Social History Reviewed with no changes to be made today.   Outpatient Medications Prior to Visit  Medication Sig Dispense Refill  . Cholecalciferol (VITAMIN D) 2000 units tablet Take 1 tablet (2,000 Units total) by mouth daily. 30 tablet 2  . amLODipine (NORVASC) 5 MG tablet Take 1 tablet (5 mg total) by mouth daily. 90 tablet 3  . acetaminophen (TYLENOL) 325 MG tablet Take 650 mg by mouth every 6 (six) hours as needed.    . polyvinyl alcohol (ARTIFICIAL TEARS) 1.4 % ophthalmic solution Place 1 drop into both eyes as needed for dry eyes. (Patient not taking: Reported on 02/08/2018) 15 mL 0   No facility-administered medications prior to visit.     No Known Allergies     Objective:    BP (!) 141/89 (BP Location: Right Arm, Patient Position: Sitting, Cuff Size: Normal)   Pulse 74   Temp 98.2 F (36.8 C) (Oral)   Ht 5\' 3"  (1.6 m)   Wt 140 lb (63.5 kg)   LMP 07/08/2018   SpO2 98%   BMI 24.80 kg/m  Wt Readings from Last 3 Encounters:  07/18/18 140 lb (63.5 kg)  05/31/18 139 lb 6.4 oz (63.2 kg)  05/09/18 139 lb 3.2 oz (63.1 kg)    Physical Exam  Constitutional: She is oriented to person, place, and time. She appears well-developed and well-nourished. She is cooperative.  HENT:  Head: Normocephalic and atraumatic.  Eyes: EOM are normal.  Neck: Normal range of motion.  Cardiovascular: Normal rate, regular rhythm, normal heart sounds and intact distal pulses. Exam reveals no gallop and no friction rub.  No murmur heard. Pulmonary/Chest: Effort normal and breath  sounds normal. No tachypnea. No respiratory distress. She has no decreased breath sounds. She has no wheezes. She has no rhonchi. She has no rales. She exhibits no tenderness.  Abdominal: Soft. Bowel sounds are normal.  Musculoskeletal: Normal range of motion. She exhibits no edema.  Neurological: She is alert and oriented to person, place, and time. She has normal strength. No cranial nerve deficit or sensory deficit. Coordination and gait normal.  Skin: Skin is warm and dry.  Psychiatric: She has a normal mood and affect. Her behavior is normal. Judgment and thought content normal.  Nursing note and vitals reviewed.      Patient has been counseled extensively about nutrition and exercise as well as the importance of adherence with medications and regular follow-up. The patient was given clear instructions to go to ER or return to medical center if symptoms don't improve, worsen or new problems develop. The patient verbalized understanding.   Follow-up: Return in about 3 weeks (around 08/08/2018) for BP recheck.   Claiborne Rigg, FNP-BC [facility] [place], [place] [phone]   07/18/2018, 2:48 PM

## 2018-07-18 NOTE — Patient Instructions (Signed)

## 2018-08-16 ENCOUNTER — Ambulatory Visit: Payer: Self-pay | Attending: Nurse Practitioner | Admitting: Nurse Practitioner

## 2018-08-16 ENCOUNTER — Other Ambulatory Visit: Payer: Self-pay

## 2018-08-16 ENCOUNTER — Encounter: Payer: Self-pay | Admitting: Nurse Practitioner

## 2018-08-16 VITALS — BP 132/83 | HR 74 | Temp 98.2°F | Resp 16 | Wt 141.6 lb

## 2018-08-16 DIAGNOSIS — D573 Sickle-cell trait: Secondary | ICD-10-CM | POA: Insufficient documentation

## 2018-08-16 DIAGNOSIS — Z79899 Other long term (current) drug therapy: Secondary | ICD-10-CM | POA: Insufficient documentation

## 2018-08-16 DIAGNOSIS — I10 Essential (primary) hypertension: Secondary | ICD-10-CM

## 2018-08-16 NOTE — Progress Notes (Signed)
Follow up HTN Takes medication most days.

## 2018-08-16 NOTE — Progress Notes (Signed)
Assessment & Plan:  Holly Oconnor was seen today for follow-up.  Diagnoses and all orders for this visit:  Essential hypertension Continue all antihypertensives as prescribed.  Remember to bring in your blood pressure log with you for your follow up appointment.  DASH/Mediterranean Diets are healthier choices for HTN.    Patient has been counseled on age-appropriate routine health concerns for screening and prevention. These are reviewed and up-to-date. Referrals have been placed accordingly. Immunizations are up-to-date or declined.    Subjective:   Chief Complaint  Patient presents with  . Follow-up   HPI Holly Oconnor 37 y.o. female presents to office today for follow up to HTN.   Essential Hypertension Blood pressure has improved and is currently controlled. She does not monitor her blood pressure at home. Endorses medication compliance taking amlodipine 10mg  daily. She denies chest pain, shortness of breath, palpitations, lightheadedness, dizziness, headaches or BLE edema.  BP Readings from Last 3 Encounters:  08/16/18 132/83  07/18/18 (!) 141/89  05/31/18 (!) 152/102     Review of Systems  Constitutional: Negative for fever, malaise/fatigue and weight loss.  HENT: Negative.  Negative for nosebleeds.   Eyes: Negative.  Negative for blurred vision, double vision and photophobia.  Respiratory: Negative.  Negative for cough and shortness of breath.   Cardiovascular: Negative.  Negative for chest pain, palpitations and leg swelling.  Gastrointestinal: Negative.  Negative for heartburn, nausea and vomiting.  Musculoskeletal: Negative.  Negative for myalgias.  Neurological: Negative.  Negative for dizziness, focal weakness, seizures and headaches.  Psychiatric/Behavioral: Negative.  Negative for suicidal ideas.    Past Medical History:  Diagnosis Date  . AS (sickle cell trait) (HCC) 12/22/2012  . Hypertension     History reviewed. No pertinent surgical history.  Family  History  Problem Relation Age of Onset  . Diabetes Mother   . Diabetes Father     Social History Reviewed with no changes to be made today.   Outpatient Medications Prior to Visit  Medication Sig Dispense Refill  . acetaminophen (TYLENOL) 325 MG tablet Take 650 mg by mouth every 6 (six) hours as needed.    Marland Kitchen amLODipine (NORVASC) 10 MG tablet Take 1 tablet (10 mg total) by mouth daily. 90 tablet 3  . Cholecalciferol (VITAMIN D) 2000 units tablet Take 1 tablet (2,000 Units total) by mouth daily. 30 tablet 2  . polyvinyl alcohol (ARTIFICIAL TEARS) 1.4 % ophthalmic solution Place 1 drop into both eyes as needed for dry eyes. (Patient not taking: Reported on 02/08/2018) 15 mL 0   No facility-administered medications prior to visit.     No Known Allergies     Objective:    BP 132/83 (BP Location: Left Arm, Patient Position: Sitting, Cuff Size: Small)   Pulse 74   Temp 98.2 F (36.8 C) (Oral)   Resp 16   Wt 141 lb 9.6 oz (64.2 kg)   LMP 08/02/2018 (Approximate)   SpO2 97%   BMI 25.08 kg/m  Wt Readings from Last 3 Encounters:  08/16/18 141 lb 9.6 oz (64.2 kg)  07/18/18 140 lb (63.5 kg)  05/31/18 139 lb 6.4 oz (63.2 kg)    Physical Exam  Constitutional: She is oriented to person, place, and time. She appears well-developed and well-nourished. She is cooperative.  HENT:  Head: Normocephalic and atraumatic.  Eyes: EOM are normal.  Neck: Normal range of motion.  Cardiovascular: Normal rate, regular rhythm, normal heart sounds and intact distal pulses. Exam reveals no gallop and no friction rub.  No murmur heard. Pulmonary/Chest: Effort normal and breath sounds normal. No tachypnea. No respiratory distress. She has no decreased breath sounds. She has no wheezes. She has no rhonchi. She has no rales. She exhibits no tenderness.  Abdominal: Soft. Bowel sounds are normal.  Musculoskeletal: Normal range of motion. She exhibits no edema.  Neurological: She is alert and oriented to  person, place, and time. Coordination normal.  Skin: Skin is warm and dry.  Psychiatric: She has a normal mood and affect. Her behavior is normal. Judgment and thought content normal.  Nursing note and vitals reviewed.      Patient has been counseled extensively about nutrition and exercise as well as the importance of adherence with medications and regular follow-up. The patient was given clear instructions to go to ER or return to medical center if symptoms don't improve, worsen or new problems develop. The patient verbalized understanding.   Follow-up: Return in about 3 months (around 11/15/2018) for BP recheck.   Claiborne RiggZelda W Vega Stare, FNP-BC [facility] [place], [place] [phone]   08/16/2018, 3:06 PM

## 2018-08-16 NOTE — Patient Instructions (Signed)
DASH Eating Plan DASH stands for "Dietary Approaches to Stop Hypertension." The DASH eating plan is a healthy eating plan that has been shown to reduce high blood pressure (hypertension). It may also reduce your risk for type 2 diabetes, heart disease, and stroke. The DASH eating plan may also help with weight loss. What are tips for following this plan? General guidelines  Avoid eating more than 2,300 mg (milligrams) of salt (sodium) a day. If you have hypertension, you may need to reduce your sodium intake to 1,500 mg a day.  Limit alcohol intake to no more than 1 drink a day for nonpregnant women and 2 drinks a day for men. One drink equals 12 oz of beer, 5 oz of wine, or 1 oz of hard liquor.  Work with your health care provider to maintain a healthy body weight or to lose weight. Ask what an ideal weight is for you.  Get at least 30 minutes of exercise that causes your heart to beat faster (aerobic exercise) most days of the week. Activities may include walking, swimming, or biking.  Work with your health care provider or diet and nutrition specialist (dietitian) to adjust your eating plan to your individual calorie needs. Reading food labels  Check food labels for the amount of sodium per serving. Choose foods with less than 5 percent of the Daily Value of sodium. Generally, foods with less than 300 mg of sodium per serving fit into this eating plan.  To find whole grains, look for the word "whole" as the first word in the ingredient list. Shopping  Buy products labeled as "low-sodium" or "no salt added."  Buy fresh foods. Avoid canned foods and premade or frozen meals. Cooking  Avoid adding salt when cooking. Use salt-free seasonings or herbs instead of table salt or sea salt. Check with your health care provider or pharmacist before using salt substitutes.  Do not fry foods. Cook foods using healthy methods such as baking, boiling, grilling, and broiling instead.  Cook with  heart-healthy oils, such as olive, canola, soybean, or sunflower oil. Meal planning   Eat a balanced diet that includes: ? 5 or more servings of fruits and vegetables each day. At each meal, try to fill half of your plate with fruits and vegetables. ? Up to 6-8 servings of whole grains each day. ? Less than 6 oz of lean meat, poultry, or fish each day. A 3-oz serving of meat is about the same size as a deck of cards. One egg equals 1 oz. ? 2 servings of low-fat dairy each day. ? A serving of nuts, seeds, or beans 5 times each week. ? Heart-healthy fats. Healthy fats called Omega-3 fatty acids are found in foods such as flaxseeds and coldwater fish, like sardines, salmon, and mackerel.  Limit how much you eat of the following: ? Canned or prepackaged foods. ? Food that is high in trans fat, such as fried foods. ? Food that is high in saturated fat, such as fatty meat. ? Sweets, desserts, sugary drinks, and other foods with added sugar. ? Full-fat dairy products.  Do not salt foods before eating.  Try to eat at least 2 vegetarian meals each week.  Eat more home-cooked food and less restaurant, buffet, and fast food.  When eating at a restaurant, ask that your food be prepared with less salt or no salt, if possible. What foods are recommended? The items listed may not be a complete list. Talk with your dietitian about what   dietary choices are best for you. Grains Whole-grain or whole-wheat bread. Whole-grain or whole-wheat pasta. Brown rice. Oatmeal. Quinoa. Bulgur. Whole-grain and low-sodium cereals. Pita bread. Low-fat, low-sodium crackers. Whole-wheat flour tortillas. Vegetables Fresh or frozen vegetables (raw, steamed, roasted, or grilled). Low-sodium or reduced-sodium tomato and vegetable juice. Low-sodium or reduced-sodium tomato sauce and tomato paste. Low-sodium or reduced-sodium canned vegetables. Fruits All fresh, dried, or frozen fruit. Canned fruit in natural juice (without  added sugar). Meat and other protein foods Skinless chicken or turkey. Ground chicken or turkey. Pork with fat trimmed off. Fish and seafood. Egg whites. Dried beans, peas, or lentils. Unsalted nuts, nut butters, and seeds. Unsalted canned beans. Lean cuts of beef with fat trimmed off. Low-sodium, lean deli meat. Dairy Low-fat (1%) or fat-free (skim) milk. Fat-free, low-fat, or reduced-fat cheeses. Nonfat, low-sodium ricotta or cottage cheese. Low-fat or nonfat yogurt. Low-fat, low-sodium cheese. Fats and oils Soft margarine without trans fats. Vegetable oil. Low-fat, reduced-fat, or light mayonnaise and salad dressings (reduced-sodium). Canola, safflower, olive, soybean, and sunflower oils. Avocado. Seasoning and other foods Herbs. Spices. Seasoning mixes without salt. Unsalted popcorn and pretzels. Fat-free sweets. What foods are not recommended? The items listed may not be a complete list. Talk with your dietitian about what dietary choices are best for you. Grains Baked goods made with fat, such as croissants, muffins, or some breads. Dry pasta or rice meal packs. Vegetables Creamed or fried vegetables. Vegetables in a cheese sauce. Regular canned vegetables (not low-sodium or reduced-sodium). Regular canned tomato sauce and paste (not low-sodium or reduced-sodium). Regular tomato and vegetable juice (not low-sodium or reduced-sodium). Pickles. Olives. Fruits Canned fruit in a light or heavy syrup. Fried fruit. Fruit in cream or butter sauce. Meat and other protein foods Fatty cuts of meat. Ribs. Fried meat. Bacon. Sausage. Bologna and other processed lunch meats. Salami. Fatback. Hotdogs. Bratwurst. Salted nuts and seeds. Canned beans with added salt. Canned or smoked fish. Whole eggs or egg yolks. Chicken or turkey with skin. Dairy Whole or 2% milk, cream, and half-and-half. Whole or full-fat cream cheese. Whole-fat or sweetened yogurt. Full-fat cheese. Nondairy creamers. Whipped toppings.  Processed cheese and cheese spreads. Fats and oils Butter. Stick margarine. Lard. Shortening. Ghee. Bacon fat. Tropical oils, such as coconut, palm kernel, or palm oil. Seasoning and other foods Salted popcorn and pretzels. Onion salt, garlic salt, seasoned salt, table salt, and sea salt. Worcestershire sauce. Tartar sauce. Barbecue sauce. Teriyaki sauce. Soy sauce, including reduced-sodium. Steak sauce. Canned and packaged gravies. Fish sauce. Oyster sauce. Cocktail sauce. Horseradish that you find on the shelf. Ketchup. Mustard. Meat flavorings and tenderizers. Bouillon cubes. Hot sauce and Tabasco sauce. Premade or packaged marinades. Premade or packaged taco seasonings. Relishes. Regular salad dressings. Where to find more information:  National Heart, Lung, and Blood Institute: www.nhlbi.nih.gov  American Heart Association: www.heart.org Summary  The DASH eating plan is a healthy eating plan that has been shown to reduce high blood pressure (hypertension). It may also reduce your risk for type 2 diabetes, heart disease, and stroke.  With the DASH eating plan, you should limit salt (sodium) intake to 2,300 mg a day. If you have hypertension, you may need to reduce your sodium intake to 1,500 mg a day.  When on the DASH eating plan, aim to eat more fresh fruits and vegetables, whole grains, lean proteins, low-fat dairy, and heart-healthy fats.  Work with your health care provider or diet and nutrition specialist (dietitian) to adjust your eating plan to your individual   calorie needs. This information is not intended to replace advice given to you by your health care provider. Make sure you discuss any questions you have with your health care provider. Document Released: 08/20/2011 Document Revised: 08/24/2016 Document Reviewed: 08/24/2016 Elsevier Interactive Patient Education  2018 [employer] Inc.  

## 2018-09-09 MED FILL — AMLODIPINE BESYLATE 10 MG T: 10 | 30 days supply | Qty: 30 | Fill #0

## 2018-10-20 ENCOUNTER — Ambulatory Visit: Payer: Medicaid Other | Attending: Family Medicine

## 2018-10-20 MED FILL — AMLODIPINE BESYLATE 10 MG T: 10 | 30 days supply | Qty: 30 | Fill #1

## 2018-11-14 ENCOUNTER — Ambulatory Visit: Payer: Medicaid Other | Admitting: Nurse Practitioner

## 2018-11-29 ENCOUNTER — Ambulatory Visit: Payer: Medicaid Other | Admitting: Nurse Practitioner

## 2018-12-20 ENCOUNTER — Ambulatory Visit: Payer: Medicaid Other | Admitting: Nurse Practitioner

## 2018-12-30 ENCOUNTER — Telehealth: Payer: Self-pay | Admitting: Nurse Practitioner

## 2018-12-30 NOTE — Telephone Encounter (Signed)
New Message   Pt is calling, states she is having some vaginal itching. Please f/u

## 2018-12-30 NOTE — Telephone Encounter (Signed)
Called patient to drop off a urine for cytology.  Pt. Understood.

## 2019-01-02 ENCOUNTER — Other Ambulatory Visit: Payer: Self-pay

## 2019-01-02 ENCOUNTER — Ambulatory Visit: Payer: Self-pay | Attending: Family Medicine

## 2019-01-02 DIAGNOSIS — N760 Acute vaginitis: Secondary | ICD-10-CM

## 2019-01-02 LAB — POCT URINALYSIS DIP (CLINITEK)
Bilirubin, UA: NEGATIVE
Blood, UA: NEGATIVE
Glucose, UA: NEGATIVE mg/dL
Ketones, POC UA: NEGATIVE mg/dL
Nitrite, UA: NEGATIVE
Spec Grav, UA: 1.015 (ref 1.010–1.025)
Urobilinogen, UA: 1 E.U./dL
pH, UA: 8.5 — AB (ref 5.0–8.0)

## 2019-01-02 NOTE — Progress Notes (Signed)
Patient came to drop off a urine sample for urine cytology.

## 2019-01-04 ENCOUNTER — Telehealth: Payer: Self-pay | Admitting: Nurse Practitioner

## 2019-01-04 LAB — URINE CYTOLOGY ANCILLARY ONLY
Chlamydia: NEGATIVE
Neisseria Gonorrhea: NEGATIVE
Trichomonas: NEGATIVE

## 2019-01-04 NOTE — Telephone Encounter (Signed)
Pt called ion for lab results please follow up

## 2019-01-05 LAB — URINE CYTOLOGY ANCILLARY ONLY
Bacterial vaginitis: NEGATIVE
Candida vaginitis: NEGATIVE

## 2019-01-06 NOTE — Telephone Encounter (Signed)
CMA spoke to PCP and PCP recommend patient to buy OTC vaginal cream to relief the itching due to the urine cytology and urinalysis was negative.  CMA left a VM for patient.

## 2019-01-06 NOTE — Telephone Encounter (Signed)
-----   Message from Claiborne Rigg, NP sent at 01/05/2019 11:38 AM EDT ----- Urine does not show any bacteria, yeast or infection of the vagina or urinary tract

## 2019-01-06 NOTE — Telephone Encounter (Signed)
CMA spoke to patient to inform on urinalysis and urine cytology results.  Pt. Understood.  Pt. Stated she is asking if there is anything that can help her to relief the vaginal itching and pain when she urinates.

## 2019-01-06 NOTE — Telephone Encounter (Signed)
-----   Message from Claiborne Rigg, NP sent at 01/02/2019 11:24 PM EDT ----- UA negative for UTI. Awaiting additional tests

## 2019-02-07 ENCOUNTER — Other Ambulatory Visit: Payer: Self-pay

## 2019-02-07 ENCOUNTER — Ambulatory Visit: Payer: Self-pay | Attending: Nurse Practitioner | Admitting: Nurse Practitioner

## 2019-02-07 ENCOUNTER — Encounter: Payer: Self-pay | Admitting: Nurse Practitioner

## 2019-02-07 DIAGNOSIS — I10 Essential (primary) hypertension: Secondary | ICD-10-CM

## 2019-02-07 MED ORDER — AMLODIPINE BESYLATE 10 MG PO TABS: 10.0000 mg | ORAL_TABLET | Freq: Every day | ORAL | 3 refills | Status: DC

## 2019-02-07 MED FILL — ?AMLODIPINE BESYLATE 10 MG: 10 | 30 days supply | Qty: 30 | Fill #0

## 2019-02-07 NOTE — Progress Notes (Signed)
Virtual Visit via Telephone Note Due to national recommendations of social distancing due to COVID 19, telehealth visit is felt to be most appropriate for this patient at this time.  I discussed the limitations, risks, security and privacy concerns of performing an evaluation and management service by telephone and the availability of in person appointments. I also discussed with the patient that there may be a patient responsible charge related to this service. The patient expressed understanding and agreed to proceed.    I connected with Abrea Monts on 02/07/19  at   2:30 PM EDT  EDT by telephone and verified that I am speaking with the correct person using two identifiers.   Consent I discussed the limitations, risks, security and privacy concerns of performing an evaluation and management service by telephone and the availability of in person appointments. I also discussed with the patient that there may be a patient responsible charge related to this service. The patient expressed understanding and agreed to proceed.   Location of Patient: Private residence   [facility]    Persons participating in Telemedicine visit: Bertram DenverZelda Fleming FNP-BC YY Bien CMA Capri Ertle    History of Present Illness: Telemedicine visit for: Essential Hypertension   She has not been taking amlodipine mg daily as prescribed. States she sometimes forgets to take her medications. Denies chest pain, shortness of breath, palpitations, lightheadedness, dizziness, headaches or BLE edema. She does not monitor her blood pressure at home. Avoids excessive sodium in her diet.  BP Readings from Last 3 Encounters:  08/16/18 132/83  07/18/18 (!) 141/89  05/31/18 (!) 152/102      Past Medical History:  Diagnosis Date  . AS (sickle cell trait) (HCC) 12/22/2012  . Hypertension     History reviewed. No pertinent surgical history.  Family History  Problem  Relation Age of Onset  . Diabetes Mother   . Diabetes Father     Social History   Socioeconomic History  . Marital status: Married    Spouse name: Not on file  . Number of children: Not on file  . Years of education: Not on file  . Highest education level: Not on file  Occupational History  . Not on file  Social Needs  . Financial resource strain: Not on file  . Food insecurity:    Worry: Not on file    Inability: Not on file  . Transportation needs:    Medical: Not on file    Non-medical: Not on file  Tobacco Use  . Smoking status: Never Smoker  . Smokeless tobacco: Never Used  Substance and Sexual Activity  . Alcohol use: No  . Drug use: No  . Sexual activity: Yes    Birth control/protection: Condom  Lifestyle  . Physical activity:    Days per week: Not on file    Minutes per session: Not on file  . Stress: Not on file  Relationships  . Social connections:    Talks on phone: Not on file    Gets together: Not on file    Attends religious service: Not on file    Active member of club or organization: Not on file    Attends meetings of clubs or organizations: Not on file    Relationship status: Not on file  Other Topics Concern  . Not on file  Social History Narrative  . Not on file     Observations/Objective: Awake, alert and oriented x 3  Review of Systems  Constitutional: Negative for fever, malaise/fatigue and weight loss.  HENT: Negative.  Negative for nosebleeds.   Eyes: Negative.  Negative for blurred vision, double vision and photophobia.  Respiratory: Negative.  Negative for cough and shortness of breath.   Cardiovascular: Negative.  Negative for chest pain, palpitations and leg swelling.  Gastrointestinal: Negative.  Negative for heartburn, nausea and vomiting.  Musculoskeletal: Negative.  Negative for myalgias.  Neurological: Negative.  Negative for dizziness, focal weakness, seizures and headaches.  Psychiatric/Behavioral: Negative.  Negative  for suicidal ideas.    Assessment and Plan: Coree was seen today for follow-up.  Diagnoses and all orders for this visit:  Essential hypertension -     amLODipine (NORVASC) 10 MG tablet; Take 1 tablet (10 mg total) by mouth daily. INSTRUCTIONS: Work on a low fat, heart healthy diet and participate in regular aerobic exercise program by working out at least 150 minutes per week; 5 days a week-30 minutes per day. Avoid red meat, fried foods. junk foods, sodas, sugary drinks, unhealthy snacking, alcohol and smoking.  Drink at least 48oz of water per day and monitor your carbohydrate intake daily.  Continue all antihypertensives as prescribed.  Remember to bring in your blood pressure log with you for your follow up appointment.  DASH/Mediterranean Diets are healthier choices for HTN.    Follow Up Instructions Return in about 4 weeks (around 03/07/2019) for NON FASTING LABS.     I discussed the assessment and treatment plan with the patient. The patient was provided an opportunity to ask questions and all were answered. The patient agreed with the plan and demonstrated an understanding of the instructions.   The patient was advised to call back or seek an in-person evaluation if the symptoms worsen or if the condition fails to improve as anticipated.  I provided 18 minutes of non-face-to-face time during this encounter including median intraservice time, reviewing previous notes, labs, imaging, medications and explaining diagnosis and management.  Claiborne Rigg, FNP-BC

## 2019-03-07 ENCOUNTER — Other Ambulatory Visit: Payer: Medicaid Other

## 2019-03-09 ENCOUNTER — Other Ambulatory Visit: Payer: Self-pay | Admitting: Nurse Practitioner

## 2019-03-09 ENCOUNTER — Other Ambulatory Visit: Payer: Self-pay

## 2019-03-09 ENCOUNTER — Ambulatory Visit: Payer: Self-pay | Attending: Nurse Practitioner

## 2019-03-09 DIAGNOSIS — Z0000 Encounter for general adult medical examination without abnormal findings: Secondary | ICD-10-CM

## 2019-03-10 LAB — LIPID PANEL
Chol/HDL Ratio: 2.9 ratio (ref 0.0–4.4)
Cholesterol, Total: 143 mg/dL (ref 100–199)
HDL: 49 mg/dL (ref >39)
LDL Calculated: 82 mg/dL (ref 0–99)
Triglycerides: 60 mg/dL (ref 0–149)
VLDL Cholesterol Cal: 12 mg/dL (ref 5–40)

## 2019-03-10 LAB — CBC
Hematocrit: 39 % (ref 34.0–46.6)
Hemoglobin: 12.8 g/dL (ref 11.1–15.9)
MCH: 29.6 pg (ref 26.6–33.0)
MCHC: 32.8 g/dL (ref 31.5–35.7)
MCV: 90 fL (ref 79–97)
Platelets: 147 10*3/uL — ABNORMAL LOW (ref 150–450)
RBC: 4.32 x10E6/uL (ref 3.77–5.28)
RDW: 12.8 % (ref 11.7–15.4)
WBC: 4.4 10*3/uL (ref 3.4–10.8)

## 2019-03-10 LAB — BASIC METABOLIC PANEL
BUN/Creatinine Ratio: 13 (ref 9–23)
BUN: 9 mg/dL (ref 6–20)
CO2: 22 mmol/L (ref 20–29)
Calcium: 9.6 mg/dL (ref 8.7–10.2)
Chloride: 101 mmol/L (ref 96–106)
Creatinine, Ser: 0.67 mg/dL (ref 0.57–1.00)
GFR calc Af Amer: 131 mL/min/{1.73_m2} (ref >59)
GFR calc non Af Amer: 113 mL/min/{1.73_m2} (ref >59)
Glucose: 89 mg/dL (ref 65–99)
Potassium: 3.8 mmol/L (ref 3.5–5.2)
Sodium: 140 mmol/L (ref 134–144)

## 2019-03-20 NOTE — Progress Notes (Signed)
CMA spoke to patient to inform on results.  Pt. Verified DOB. Pt. Understood.  

## 2019-04-12 ENCOUNTER — Telehealth: Payer: Self-pay | Admitting: Nurse Practitioner

## 2019-04-12 NOTE — Telephone Encounter (Signed)
Pt called to inform that amLODipine (NORVASC) 10 MG tablet [[id number]] is causing her headaches and would like to be called back..please follow up

## 2019-04-12 NOTE — Telephone Encounter (Signed)
Will route to PCP 

## 2019-04-12 NOTE — Telephone Encounter (Signed)
She can take 5 mg and break the tablet in half. Will need to schedule BP recheck in 2 weeks on 5 mg of amlodipine. This is the original dosage she has always been on in the past.

## 2019-04-13 NOTE — Telephone Encounter (Signed)
CMA spoke to patient and inform to break her Amlodipine in half 5mg . Pt. Scheduled to see Clinical Pharmacist [facility] for BP check due to PCP have openings.

## 2019-04-27 ENCOUNTER — Encounter: Payer: Self-pay | Admitting: Pharmacist

## 2019-04-27 ENCOUNTER — Other Ambulatory Visit: Payer: Self-pay

## 2019-04-27 ENCOUNTER — Ambulatory Visit: Payer: Self-pay | Attending: Nurse Practitioner | Admitting: Pharmacist

## 2019-04-27 VITALS — BP 142/104 | HR 71

## 2019-04-27 DIAGNOSIS — I10 Essential (primary) hypertension: Secondary | ICD-10-CM

## 2019-04-27 NOTE — Patient Instructions (Signed)
Thank you for coming to see [place] today.   Blood pressure today is elevated.  Start taking blood pressure medications as prescribed.   Limiting salt and caffeine, as well as exercising as able for at least 30 minutes for 5 days out of the week, can also help you lower your blood pressure.  Take your blood pressure at home if you are able. Please write down these numbers and bring them to your visits.  If you have any questions about medications, please call me [phone].  Holly Oconnor.

## 2019-04-27 NOTE — Progress Notes (Signed)
   S:    PCP: Zelda   Patient arrives in good spirits. Presents to the clinic for a BP check.  Patient was referred on 04/12/19 after a telephone encounter - pt reported HA with amlodipine 10 mg and Zelda instructed the patient to take 5 mg instead. Patient was last seen by PCP on 02/07/19.   Denies chest pain, dyspnea, HA or blurred vision. No BLE edema.  Patient denies adherence with medications.  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 Family / Social history:  - FHx: DM (father, mother) - Never smoker  - Denies alcohol use  O: BP in L arm after 5 minutes rest: 142/104, HR 71  Home BP readings: checks once in while; unable to provide range/number  Last 3 Office BP readings: BP Readings from Last 3 Encounters:  04/27/19 (!) 142/104  08/16/18 132/83  07/18/18 (!) 141/89   BMET    Component Value Date/Time   NA 140 03/09/2019 1105   K 3.8 03/09/2019 1105   CL 101 03/09/2019 1105   CO2 22 03/09/2019 1105   GLUCOSE 89 03/09/2019 1105   GLUCOSE 84 09/01/2016 1220   BUN 9 03/09/2019 1105   CREATININE 0.67 03/09/2019 1105   CALCIUM 9.6 03/09/2019 1105   GFRNONAA 113 03/09/2019 1105   GFRAA 131 03/09/2019 1105   Renal function: CrCl cannot be calculated (Patient's most recent lab result is older than the maximum 21 days allowed.).  Clinical ASCVD: No  The ASCVD Risk score Mikey Bussing DC Jr., et al., 2013) failed to calculate for the following reasons:   The 2013 ASCVD risk score is only valid for ages 76 to 68  A/P: Hypertension longstanding currently above goal on current medications. BP Goal <130/80 mmHg. Patient is not adherent with current medications. Reinforced compliance to patient and discussed several methods to  improve adherence. Pt will try a standard pillbox with an alarm on her phone. Advised her to take her amlodipine before bedtime to aid in her routine. Will f/u in 2 weeks to recheck.  -Continued amlodipine 5 mg daily. -Counseled on lifestyle modifications for blood pressure control including reduced dietary sodium, increased exercise, adequate sleep  Results reviewed and written information provided. Total time in face-to-face counseling 15 minutes.   F/U Clinic Visit in 2 weeks.   Benard Halsted, PharmD, [place] [phone]

## 2019-05-02 ENCOUNTER — Ambulatory Visit: Payer: Self-pay | Attending: Family Medicine

## 2019-05-02 ENCOUNTER — Other Ambulatory Visit: Payer: Self-pay

## 2019-05-11 ENCOUNTER — Ambulatory Visit: Payer: Medicaid Other | Admitting: Pharmacist

## 2019-05-11 ENCOUNTER — Ambulatory Visit: Payer: Self-pay | Attending: Family Medicine | Admitting: Pharmacist

## 2019-05-11 ENCOUNTER — Encounter: Payer: Self-pay | Admitting: Pharmacist

## 2019-05-11 ENCOUNTER — Other Ambulatory Visit: Payer: Self-pay

## 2019-05-11 ENCOUNTER — Telehealth: Payer: Self-pay | Admitting: Nurse Practitioner

## 2019-05-11 VITALS — BP 123/80 | HR 76

## 2019-05-11 DIAGNOSIS — I10 Essential (primary) hypertension: Secondary | ICD-10-CM

## 2019-05-11 NOTE — Patient Instructions (Signed)

## 2019-05-11 NOTE — Progress Notes (Signed)
   S:    PCP: Zelda   Patient arrives in good spirits. Presents to the clinic for a BP check.  Patient was referred on 04/12/19 after a telephone encounter. I saw her on 04/27/19 and her BP was 142/104. Pt reported noncompliance with amlodipine.   Denies chest pain, dyspnea, HA or blurred vision. No BLE edema.  Patient reports adherence with medications. Reports that her pill box helps her with adherence.   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 Family / Social history:  - FHx: DM (father, mother) - Never smoker  - Denies alcohol use  O: BP in R arm after 5 minutes rest: 123/80, HR 76  Home BP readings: does not check  Last 3 Office BP readings: BP Readings from Last 3 Encounters:  04/27/19 (!) 142/104  08/16/18 132/83  07/18/18 (!) 141/89   BMET    Component Value Date/Time   NA 140 03/09/2019 1105   K 3.8 03/09/2019 1105   CL 101 03/09/2019 1105   CO2 22 03/09/2019 1105   GLUCOSE 89 03/09/2019 1105   GLUCOSE 84 09/01/2016 1220   BUN 9 03/09/2019 1105   CREATININE 0.67 03/09/2019 1105   CALCIUM 9.6 03/09/2019 1105   GFRNONAA 113 03/09/2019 1105   GFRAA 131 03/09/2019 1105   Renal function: CrCl cannot be calculated (Patient's most recent lab result is older than the maximum 21 days allowed.).  Clinical ASCVD: No  The ASCVD Risk score Mikey Bussing DC Jr., et al., 2013) failed to calculate for the following reasons:   The 2013 ASCVD risk score is only valid for ages 103 to 19  A/P: Hypertension longstanding currently above goal on current medications. BP Goal <130/80 mmHg. Patient is adherent with current medications and her BP has improved greatly. Reinforced adherence; she knows to contact us for refill concerns.  -Continued amlodipine 5 mg daily. -Counseled on lifestyle modifications  for blood pressure control including reduced dietary sodium, increased exercise, adequate sleep -HM: flu shot due; pt declined but states she will think about it before seeing Ms. Army Melia.   Results reviewed and written information provided. Total time in face-to-face counseling 15 minutes.   F/U Clinic Visit 06/20/19 w/ PCP.   Patient seen with:  Dillard Essex PharmD Candidate  Class of 2022 [place], PharmD, [place] [phone]

## 2019-05-11 NOTE — Telephone Encounter (Signed)
t called me to informed that she has been trying to contact the Health Navigator and left msg but so far no one has call her back, I inform her that the number I give her is the only number we have for them

## 2019-05-11 NOTE — Telephone Encounter (Signed)
Patient would like to speak to you in regards to her cafa please follow up

## 2019-06-01 ENCOUNTER — Telehealth: Payer: Self-pay | Admitting: Nurse Practitioner

## 2019-06-01 NOTE — Telephone Encounter (Signed)
I call Pt to inform her that she was approve for CAFA with a 75% discount, an am making a copy of the letter an will sent it to her by mail

## 2019-06-01 NOTE — Telephone Encounter (Signed)
Patient called wanting to get an update on her cafa please follow up.  °

## 2019-06-20 ENCOUNTER — Ambulatory Visit: Payer: Medicaid Other | Admitting: Nurse Practitioner

## 2019-07-21 ENCOUNTER — Other Ambulatory Visit: Payer: Self-pay

## 2019-07-21 ENCOUNTER — Encounter: Payer: Self-pay | Admitting: Nurse Practitioner

## 2019-07-21 ENCOUNTER — Ambulatory Visit: Payer: Self-pay | Attending: Nurse Practitioner | Admitting: Nurse Practitioner

## 2019-07-21 VITALS — BP 135/84 | HR 68 | Temp 98.2°F | Resp 17 | Wt 134.0 lb

## 2019-07-21 DIAGNOSIS — I10 Essential (primary) hypertension: Secondary | ICD-10-CM

## 2019-07-21 DIAGNOSIS — L700 Acne vulgaris: Secondary | ICD-10-CM

## 2019-07-21 MED ORDER — AMLODIPINE BESYLATE 10 MG PO TABS: 10.0000 mg | ORAL_TABLET | Freq: Every day | ORAL | 3 refills | Status: DC

## 2019-07-21 MED ORDER — ADAPALENE 0.1 % EX GEL: Freq: Every day | CUTANEOUS | 2 refills | Status: AC

## 2019-07-21 MED FILL — ?AMLODIPINE BESYLATE 10 MG: 10 | 90 days supply | Qty: 90 | Fill #0

## 2019-07-21 NOTE — Progress Notes (Signed)
Assessment & Plan:  Alaira was seen today for hypertension.  Diagnoses and all orders for this visit:  Acne vulgaris -     adapalene (DIFFERIN) 0.1 % gel; Apply topically at bedtime.  Essential hypertension -     amLODipine (NORVASC) 10 MG tablet; Take 1 tablet (10 mg total) by mouth daily. Please fill as a 90 day supply Continue all antihypertensives as prescribed.  Remember to bring in your blood pressure log with you for your follow up appointment.  DASH/Mediterranean Diets are healthier choices for HTN.    Patient has been counseled on age-appropriate routine health concerns for screening and prevention. These are reviewed and up-to-date. Referrals have been placed accordingly. Immunizations are up-to-date or declined.    Subjective:   Chief Complaint  Patient presents with  . Hypertension   HPI Holly Oconnor 38 y.o. female presents to office today for follow up.  Essential Hypertension Well controlled. She is taking amlodipine 10 mg daily as prescribed. She does not monitor her blood pressure at home. Denies chest pain, shortness of breath, palpitations, lightheadedness, dizziness, headaches or BLE edema. Weight is decreasing gradually.  BP Readings from Last 3 Encounters:  07/21/19 135/84  05/11/19 123/80  04/27/19 (!) 142/104    Acne: Patient presents for evaluation of acne.  Worsening over the past year since her Implanon was inserted.  Lesions are described as open comedones. Acne is primarily located on the chin and lower cheeks. Treatment to date has included skin hygiene measures: ineffective  Review of Systems  Constitutional: Negative for fever, malaise/fatigue and weight loss.  HENT: Negative.  Negative for nosebleeds.   Eyes: Negative.  Negative for blurred vision, double vision and photophobia.  Respiratory: Negative.  Negative for cough and shortness of breath.   Cardiovascular: Negative.  Negative for chest pain, palpitations and leg swelling.   Gastrointestinal: Negative.  Negative for heartburn, nausea and vomiting.  Musculoskeletal: Negative.  Negative for myalgias.  Skin:       Open comedones  Neurological: Negative.  Negative for dizziness, focal weakness, seizures and headaches.  Psychiatric/Behavioral: Negative.  Negative for suicidal ideas.    Past Medical History:  Diagnosis Date  . AS (sickle cell trait) ([place]) 12/22/2012  . Hypertension     No past surgical history on file.  Family History  Problem Relation Age of Onset  . Diabetes Mother   . Diabetes Father     Social History Reviewed with no changes to be made today.   Outpatient Medications Prior to Visit  Medication Sig Dispense Refill  . acetaminophen (TYLENOL) 325 MG tablet Take 650 mg by mouth every 6 (six) hours as needed.    Marland Kitchen amLODipine (NORVASC) 10 MG tablet Take 1 tablet (10 mg total) by mouth daily. 90 tablet 3  . Cholecalciferol (VITAMIN D) 2000 units tablet Take 1 tablet (2,000 Units total) by mouth daily. 30 tablet 2  . polyvinyl alcohol (ARTIFICIAL TEARS) 1.4 % ophthalmic solution Place 1 drop into both eyes as needed for dry eyes. (Patient not taking: Reported on 02/08/2018) 15 mL 0   No facility-administered medications prior to visit.     No Known Allergies     Objective:    BP 135/84   Pulse 68   Temp 98.2 F (36.8 C) (Temporal)   Resp 17   Wt 134 lb (60.8 kg)   SpO2 100%   BMI 23.74 kg/m  Wt Readings from Last 3 Encounters:  07/21/19 134 lb (60.8 kg)  08/16/18 141  lb 9.6 oz (64.2 kg)  07/18/18 140 lb (63.5 kg)    Physical Exam Vitals signs and nursing note reviewed.  Constitutional:      Appearance: She is well-developed.  HENT:     Head: Normocephalic and atraumatic.  Neck:     Musculoskeletal: Normal range of motion.  Cardiovascular:     Rate and Rhythm: Normal rate and regular rhythm.     Heart sounds: Normal heart sounds. No murmur. No friction rub. No gallop.   Pulmonary:     Effort: Pulmonary effort is  normal. No tachypnea or respiratory distress.     Breath sounds: Normal breath sounds. No decreased breath sounds, wheezing, rhonchi or rales.  Chest:     Chest wall: No tenderness.  Abdominal:     General: Bowel sounds are normal.     Palpations: Abdomen is soft.  Musculoskeletal: Normal range of motion.  Skin:    General: Skin is warm and dry.     Comments: Numerous open comedones on chin and bilateral cheeks  Neurological:     Mental Status: She is alert and oriented to person, place, and time.     Coordination: Coordination normal.  Psychiatric:        Behavior: Behavior normal. Behavior is cooperative.        Thought Content: Thought content normal.        Judgment: Judgment normal.          Patient has been counseled extensively about nutrition and exercise as well as the importance of adherence with medications and regular follow-up. The patient was given clear instructions to go to ER or return to medical center if symptoms don't improve, worsen or new problems develop. The patient verbalized understanding.   Follow-up: Return in about 3 months (around 10/21/2019) for HTN and labs.   Claiborne Rigg, FNP-BC [facility] [place], [place] [phone]   07/21/2019, 4:06 PM

## 2019-07-21 NOTE — Patient Instructions (Signed)
Acne  Acne is a skin problem that causes small, red bumps (pimples) and other skin changes. The skin has tiny holes called pores. Each pore has an oil gland. Acne happens when the pores get blocked. The pores may become red, sore, and swollen. They may also become infected. Acne is common among teenagers. Acne usually goes away over time. What are the causes? This condition may be caused when:  Oil glands get blocked by oil, dead skin cells, and dirt.  Bacteria that live in the oil glands increase in number and cause infection. Acne can start with changes in hormones. These changes can occur:  When children mature into their teens (adolescence).  When women get their period (menstrual cycle).  When women are pregnant. Some things can make acne worse. They include:  Cosmetics and hair products that have oil in them.  Stress.  Diseases that cause changes in hormones.  Some medicines.  Headbands, backpacks, or shoulder pads.  Being near certain oils and chemicals.  Foods that are high in sugars. These include dairy products, sweets, and chocolates. What increases the risk? You are more likely to develop this condition if:  You are a teenager.  You have a family history of acne. What are the signs or symptoms? Symptoms of this condition include:  Small, red bumps (pimples or papules).  Whiteheads.  Blackheads.  Small, pus-filled pimples (pustules).  Big, red pimples or pustules that feel tender. Acne that is very bad can cause:  An abscess. This is an area that has pus.  Cysts. These are hard, painful sacs that have fluid.  Scars. These can happen after large pimples heal. How is this treated? Treatment for this condition depends on how bad your acne is. It may include:  Creams and lotions. These can: ? Keep the pores of your skin open. ? Prevent infections and swelling.  Medicines that treat infections (antibiotics). These can be put on your skin or taken  as pills.  Pills that decrease the amount of oil in your skin.  Birth control pills.  Light or laser treatments.  Shots of medicine into the areas with acne.  Chemicals that cause the skin to peel.  Surgery. Follow these instructions at home: Good skin care is the most important thing you can do to treat your acne. Take care of your skin as told by your doctor. You may be told to do these things:  Wash your skin gently at least two times each day. You should also wash your skin: ? After you exercise. ? Before you go to bed.  Use mild soap.  Use a water-based skin moisturizer after you wash your skin.  Use a sunscreen or sunblock with SPF 30 or greater. This is very important if you are using acne medicines.  Choose cosmetics that will not block your oil glands (are noncomedogenic). Medicines  Take over-the-counter and prescription medicines only as told by your doctor.  If you were prescribed an antibiotic medicine, use it or take it as told by your doctor. Do not stop using the antibiotic even if your acne gets better. General instructions  Keep your hair clean and off your face. Shampoo your hair on a regular basis. If you have oily hair, you may need to wash it every day.  Avoid wearing tight headbands or hats.  Avoid picking or squeezing your pimples. That can make your acne worse and cause it to scar.  Shave gently. Only shave when you have to.    Keep a food journal. This can help you see if any foods are linked to your acne.  Keep all follow-up visits as told by your doctor. This is important. Contact a doctor if:  Your acne is not better after eight weeks.  Your acne gets worse.  You have a large area of skin that is red or tender.  You think that you are having side effects from any acne medicine. Summary  Acne is a skin problem that causes pimples. Acne is common among teenagers. Acne usually goes away over time.  Acne starts with changes in your  hormones. Other causes include stress, diet, and some medicines.  Follow your doctor's instructions on how to take care of your skin. Good skin care is the most important thing you can do to treat your acne.  Take over-the-counter and prescription medicines only as told by your doctor.  Contact your doctor if you think that you are having side effects from any acne medicine. This information is not intended to replace advice given to you by your health care provider. Make sure you discuss any questions you have with your health care provider. Document Released: 08/20/2011 Document Revised: 01/11/2018 Document Reviewed: 01/11/2018 Elsevier Patient Education  2020 [employer]  

## 2019-07-21 NOTE — Progress Notes (Signed)
Here for BP follow up.  Doesn't check BP at home. Denies chest pain, SHOB, headaches, palpitations, lower extremity swelling.  Would like flu shot today.

## 2019-08-18 MED FILL — ?AMLODIPINE BESYLATE 10 MG: 10 | 30 days supply | Qty: 30 | Fill #0

## 2019-10-26 ENCOUNTER — Ambulatory Visit: Payer: Medicaid Other | Attending: Nurse Practitioner | Admitting: Nurse Practitioner

## 2019-10-27 ENCOUNTER — Ambulatory Visit: Payer: Medicaid Other | Admitting: Nurse Practitioner

## 2019-11-09 ENCOUNTER — Ambulatory Visit: Payer: Self-pay | Attending: Nurse Practitioner | Admitting: Nurse Practitioner

## 2019-11-09 ENCOUNTER — Encounter: Payer: Self-pay | Admitting: Nurse Practitioner

## 2019-11-09 ENCOUNTER — Other Ambulatory Visit: Payer: Self-pay

## 2019-11-09 DIAGNOSIS — I10 Essential (primary) hypertension: Secondary | ICD-10-CM

## 2019-11-09 MED ORDER — AMLODIPINE BESYLATE 10 MG PO TABS: 10.0000 mg | ORAL_TABLET | Freq: Every day | ORAL | 3 refills | Status: DC

## 2019-11-09 NOTE — Progress Notes (Signed)
Virtual Visit via Telephone Note Due to national recommendations of social distancing due to COVID 19, telehealth visit is felt to be most appropriate for this patient at this time.  I discussed the limitations, risks, security and privacy concerns of performing an evaluation and management service by telephone and the availability of in person appointments. I also discussed with the patient that there may be a patient responsible charge related to this service. The patient expressed understanding and agreed to proceed.    I connected with Holly Oconnor on 11/09/19  at   2:10 PM EST  EDT by telephone and verified that I am speaking with the correct person using two identifiers.   Consent I discussed the limitations, risks, security and privacy concerns of performing an evaluation and management service by telephone and the availability of in person appointments. I also discussed with the patient that there may be a patient responsible charge related to this service. The patient expressed understanding and agreed to proceed.   Location of Patient: Private Residence    Location of Provider: [facility]    Persons participating in Telemedicine visit: Holly Oconnor YY Bien CMA Kataya Diggs    History of Present Illness: Telemedicine visit for: Essential Hypertension She has a blood pressure device but states it is not accurate. Taking amlodipine 10 mg daily as prescribed.  Denies chest pain, shortness of breath, palpitations, lightheadedness, dizziness, headaches or BLE edema.  BP Readings from Last 3 Encounters:  07/21/19 135/84  05/11/19 123/80  04/27/19 (!) 142/104      Past Medical History:  Diagnosis Date  . AS (sickle cell trait) (HCC) 12/22/2012  . Hypertension     History reviewed. No pertinent surgical history.  Family History  Problem Relation Age of Onset  . Diabetes Mother   . Diabetes Father     Social History   Socioeconomic  History  . Marital status: Married    Spouse name: Not on file  . Number of children: Not on file  . Years of education: Not on file  . Highest education level: Not on file  Occupational History  . Not on file  Tobacco Use  . Smoking status: Never Smoker  . Smokeless tobacco: Never Used  Substance and Sexual Activity  . Alcohol use: No  . Drug use: No  . Sexual activity: Yes    Birth control/protection: Condom  Other Topics Concern  . Not on file  Social History Narrative  . Not on file   Social Determinants of Health   Financial Resource Strain:   . Difficulty of Paying Living Expenses: Not on file  Food Insecurity:   . Worried About Programme researcher, broadcasting/film/video in the Last Year: Not on file  . Ran Out of Food in the Last Year: Not on file  Transportation Needs:   . Lack of Transportation (Medical): Not on file  . Lack of Transportation (Non-Medical): Not on file  Physical Activity:   . Days of Exercise per Week: Not on file  . Minutes of Exercise per Session: Not on file  Stress:   . Feeling of Stress : Not on file  Social Connections:   . Frequency of Communication with Friends and Family: Not on file  . Frequency of Social Gatherings with Friends and Family: Not on file  . Attends Religious Services: Not on file  . Active Member of Clubs or Organizations: Not on file  . Attends Banker Meetings: Not on  file  . Marital Status: Not on file     Observations/Objective: Awake, alert and oriented x 3   Review of Systems  Constitutional: Negative for fever, malaise/fatigue and weight loss.  HENT: Negative.  Negative for nosebleeds.   Eyes: Negative.  Negative for blurred vision, double vision and photophobia.  Respiratory: Negative.  Negative for cough and shortness of breath.   Cardiovascular: Negative.  Negative for chest pain, palpitations and leg swelling.  Gastrointestinal: Negative.  Negative for heartburn, nausea and vomiting.  Musculoskeletal:  Negative.  Negative for myalgias.  Neurological: Negative.  Negative for dizziness, focal weakness, seizures and headaches.  Psychiatric/Behavioral: Negative.  Negative for suicidal ideas.    Assessment and Plan: [name] was seen today for follow-up.  Diagnoses and all orders for this visit:  Essential hypertension -     amLODipine (NORVASC) 10 MG tablet; Take 1 tablet (10 mg total) by mouth daily. Please fill as a 90 day supply Continue all antihypertensives as prescribed.  Remember to bring in your blood pressure log with you for your follow up appointment.  DASH/Mediterranean Diets are healthier choices for HTN.     Follow Up Instructions Return in about 3 months (around 02/06/2020) for BP recheck.     I discussed the assessment and treatment plan with the patient. The patient was provided an opportunity to ask questions and all were answered. The patient agreed with the plan and demonstrated an understanding of the instructions.   The patient was advised to call back or seek an in-person evaluation if the symptoms worsen or if the condition fails to improve as anticipated.  I provided 14 minutes of non-face-to-face time during this encounter including median intraservice time, reviewing previous notes, labs, imaging, medications and explaining diagnosis and management.  Gildardo Pounds, Oconnor

## 2019-11-10 MED FILL — AMLODIPINE BESYLATE 10 MG T: 10 | 90 days supply | Qty: 90 | Fill #0

## 2019-11-14 ENCOUNTER — Other Ambulatory Visit: Payer: Medicaid Other

## 2019-11-15 ENCOUNTER — Ambulatory Visit: Payer: Self-pay | Attending: Nurse Practitioner

## 2019-11-15 ENCOUNTER — Other Ambulatory Visit: Payer: Self-pay | Admitting: Nurse Practitioner

## 2019-11-15 ENCOUNTER — Other Ambulatory Visit: Payer: Self-pay

## 2019-11-15 DIAGNOSIS — I10 Essential (primary) hypertension: Secondary | ICD-10-CM

## 2019-11-15 DIAGNOSIS — Z13220 Encounter for screening for lipoid disorders: Secondary | ICD-10-CM

## 2019-11-15 DIAGNOSIS — Z13228 Encounter for screening for other metabolic disorders: Secondary | ICD-10-CM

## 2019-11-15 DIAGNOSIS — D573 Sickle-cell trait: Secondary | ICD-10-CM

## 2019-11-16 LAB — CMP14+EGFR
ALT: 10 IU/L (ref 0–32)
AST: 16 IU/L (ref 0–40)
Albumin/Globulin Ratio: 1.7 (ref 1.2–2.2)
Albumin: 4.5 g/dL (ref 3.8–4.8)
Alkaline Phosphatase: 56 IU/L (ref 39–117)
BUN/Creatinine Ratio: 14 (ref 9–23)
BUN: 10 mg/dL (ref 6–20)
Bilirubin Total: 1 mg/dL (ref 0.0–1.2)
CO2: 22 mmol/L (ref 20–29)
Calcium: 10.2 mg/dL (ref 8.7–10.2)
Chloride: 105 mmol/L (ref 96–106)
Creatinine, Ser: 0.71 mg/dL (ref 0.57–1.00)
GFR calc Af Amer: 126 mL/min/{1.73_m2} (ref >59)
GFR calc non Af Amer: 109 mL/min/{1.73_m2} (ref >59)
Globulin, Total: 2.7 g/dL (ref 1.5–4.5)
Glucose: 81 mg/dL (ref 65–99)
Potassium: 3.9 mmol/L (ref 3.5–5.2)
Sodium: 141 mmol/L (ref 134–144)
Total Protein: 7.2 g/dL (ref 6.0–8.5)

## 2019-11-16 LAB — CBC
Hematocrit: 40.4 % (ref 34.0–46.6)
Hemoglobin: 13.1 g/dL (ref 11.1–15.9)
MCH: 29.2 pg (ref 26.6–33.0)
MCHC: 32.4 g/dL (ref 31.5–35.7)
MCV: 90 fL (ref 79–97)
Platelets: 195 10*3/uL (ref 150–450)
RBC: 4.49 x10E6/uL (ref 3.77–5.28)
RDW: 12.3 % (ref 11.7–15.4)
WBC: 6.1 10*3/uL (ref 3.4–10.8)

## 2019-11-16 LAB — LIPID PANEL
Chol/HDL Ratio: 3.2 ratio (ref 0.0–4.4)
Cholesterol, Total: 157 mg/dL (ref 100–199)
HDL: 49 mg/dL (ref >39)
LDL Chol Calc (NIH): 91 mg/dL (ref 0–99)
Triglycerides: 88 mg/dL (ref 0–149)
VLDL Cholesterol Cal: 17 mg/dL (ref 5–40)

## 2019-11-16 LAB — HEMOGLOBIN A1C
Est. average glucose Bld gHb Est-mCnc: 91 mg/dL
Hgb A1c MFr Bld: 4.8 % (ref 4.8–5.6)

## 2019-11-23 ENCOUNTER — Telehealth: Payer: Self-pay | Admitting: Nurse Practitioner

## 2019-11-23 NOTE — Telephone Encounter (Signed)
Patient returned call for lab results. Patient was identified by 2 identifiers. Patient was given lab results, verbalized understanding and had no further questions or concerns.

## 2019-12-06 ENCOUNTER — Ambulatory Visit: Payer: Self-pay | Attending: Nurse Practitioner

## 2019-12-06 ENCOUNTER — Other Ambulatory Visit: Payer: Self-pay

## 2020-01-30 ENCOUNTER — Ambulatory Visit: Payer: Medicaid Other | Admitting: Nurse Practitioner

## 2020-03-05 ENCOUNTER — Ambulatory Visit: Payer: Medicaid Other | Admitting: Nurse Practitioner

## 2020-03-16 ENCOUNTER — Encounter (HOSPITAL_COMMUNITY): Payer: Self-pay | Admitting: *Deleted

## 2020-03-16 ENCOUNTER — Other Ambulatory Visit: Payer: Self-pay

## 2020-03-16 ENCOUNTER — Ambulatory Visit (HOSPITAL_COMMUNITY)
Admission: EM | Admit: 2020-03-16 | Discharge: 2020-03-16 | Disposition: A | Discharge status: Home / Self Care | Payer: 59 | Attending: Family Medicine | Admitting: Family Medicine

## 2020-03-16 DIAGNOSIS — R519 Headache, unspecified: Secondary | ICD-10-CM

## 2020-03-16 DIAGNOSIS — I10 Essential (primary) hypertension: Secondary | ICD-10-CM

## 2020-03-16 MED ORDER — AMLODIPINE BESYLATE 10 MG PO TABS: 5.0000 mg | ORAL_TABLET | Freq: Every day | ORAL | 1 refills | Status: DC

## 2020-03-16 NOTE — Discharge Instructions (Addendum)
BP (!) 168/102   Pulse 80   Temp 97.8 F (36.6 C) (Oral)   Resp 16   LMP 03/14/2020 (Exact Date)   SpO2 100%   Breastfeeding No

## 2020-03-16 NOTE — ED Triage Notes (Signed)
Pt reports HA onset yesterday.  States is supposed to be taking HTN med, but hasn't been compliant "for a very long time".  Took a dose of old HTN Rx yesterday.  Denies any vision changes, cold sxs, fevers.

## 2020-03-16 NOTE — ED Provider Notes (Signed)
[facility]   [id number] 03/16/20 Arrival Time: 1001  ASSESSMENT & PLAN:  1. Uncontrolled hypertension   2. Acute nonintractable headache, unspecified headache type   3. Essential hypertension     Begin: Meds ordered this encounter  Medications   amLODipine (NORVASC) 10 MG tablet    Sig: Take 0.5 tablets (5 mg total) by mouth daily. Please fill as a 90 day supply    Dispense:  30 tablet    Refill:  1   Headache is getting better. No neurologic symptoms.    Follow-up Information    Schedule an appointment as soon as possible for a visit  with Claiborne Rigg, NP.   Specialty: Nurse Practitioner Why: To follow your blood pressure. Contact information: [address] [place] [address] [phone]               Reviewed expectations re: course of current medical issues. Questions answered. Outlined signs and symptoms indicating need for more acute intervention. Patient verbalized understanding. After Visit Summary given.   SUBJECTIVE:  Holly Oconnor is a 39 y.o. female who presents with concerns regarding increased blood pressures. She reports that she has been treated for hypertension in the past. Not curently. Describes dull frontal headache yesterday; maybe over past week also. Does not limit her. No visual changes. No CP/SOB.  She reports no chest pain on exertion, no dyspnea on exertion, no swelling of ankles, no orthostatic dizziness or lightheadedness, no orthopnea or paroxysmal nocturnal dyspnea and no palpitations.  Denies symptoms of chest pain, palpations, orthopnea, nocturnal dyspnea, or LE edema.  Social History   Tobacco Use  Smoking Status Never Smoker  Smokeless Tobacco Never Used    OBJECTIVE:  Vitals:   03/16/20 1016  BP: (!) 168/102  Pulse: 80  Resp: 16  Temp: 97.8 F (36.6 C)  TempSrc: Oral  SpO2: 100%    General appearance: alert; no distress Eyes: PERRLA; EOMI HENT: normocephalic; atraumatic Neck:  supple Lungs: speaks full sentences without difficulty Extremities: no edema; symmetrical with no gross deformities Skin: warm and dry Psychological: alert and cooperative; normal mood and affect  ECG: No orders found for this or any previous visit.    No Known Allergies  Past Medical History:  Diagnosis Date   AS (sickle cell trait) (HCC) 12/22/2012   Hypertension    Social History   Socioeconomic History   Marital status: Married    Spouse name: Not on file   Number of children: Not on file   Years of education: Not on file   Highest education level: Not on file  Occupational History   Not on file  Tobacco Use   Smoking status: Never Smoker   Smokeless tobacco: Never Used  Vaping Use   Vaping Use: Never used  Substance and Sexual Activity   Alcohol use: No   Drug use: No   Sexual activity: Yes    Birth control/protection: Implant  Other Topics Concern   Not on file  Social History Narrative   Not on file   Social Determinants of Health   Financial Resource Strain:    Difficulty of Paying Living Expenses:   Food Insecurity:    Worried About Programme researcher, broadcasting/film/video in the Last Year:    Barista in the Last Year:   Transportation Needs:    Lack of Transportation (Medical):    Lack of Transportation (Non-Medical):   Physical Activity:    Days of Exercise per Week:  Minutes of Exercise per Session:   Stress:    Feeling of Stress :   Social Connections:    Frequency of Communication with Friends and Family:    Frequency of Social Gatherings with Friends and Family:    Attends Religious Services:    Active Member of Clubs or Organizations:    Attends Engineer, structural:    Marital Status:   Intimate Partner Violence:    Fear of Current or Ex-Partner:    Emotionally Abused:    Physically Abused:    Sexually Abused:    Family History  Problem Relation Age of Onset   Diabetes Mother    Diabetes Father     History reviewed. No pertinent surgical history.    Mardella Layman, MD 03/16/20 1032

## 2020-05-29 ENCOUNTER — Other Ambulatory Visit: Payer: Self-pay

## 2020-05-29 ENCOUNTER — Ambulatory Visit (INDEPENDENT_AMBULATORY_CARE_PROVIDER_SITE_OTHER): Payer: 59 | Admitting: Family Medicine

## 2020-05-29 ENCOUNTER — Encounter: Payer: Self-pay | Admitting: Family Medicine

## 2020-05-29 VITALS — BP 130/72 | HR 71 | Ht 63.0 in | Wt 128.4 lb

## 2020-05-29 DIAGNOSIS — I10 Essential (primary) hypertension: Secondary | ICD-10-CM | POA: Diagnosis not present

## 2020-05-29 DIAGNOSIS — E559 Vitamin D deficiency, unspecified: Secondary | ICD-10-CM | POA: Diagnosis not present

## 2020-05-29 NOTE — Progress Notes (Signed)
° °  Subjective:    Patient ID: Holly Oconnor, female    DOB: 1981/06/03, 39 y.o.   MRN: [id number]  HPI Chief Complaint  Patient presents with   new pt    new pt get established. no concerns   She is new to the practice and here to establish care.  Previous medical care: [facility]   Other providers:  None    HTN- diagnosed 4 years ago after the birth of her 3rd child.  Takes amlodipine 5 mg.  States that as long as she is taking her medication regularly her blood pressure stays well controlled.  States diet is fairly low in sodium.  Denies fever, chills, headache, dizziness, chest pain, palpitation, shortness of breath, abdominal pain, nausea, vomiting, diarrhea, lower extremity edema.   LMP: 3 weeks ago  Contraception: Nexplanon placed 2020   She has 3 children and married.   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    Review of Systems Pertinent positives and negatives in the history of present illness.     Objective:   Physical Exam BP 130/72    Pulse 71    Ht 5\' 3"  (1.6 m)    Wt 128 lb 6.4 oz (58.2 kg)    BMI 22.75 kg/m   Alert and in no distress.  Cardiac exam shows a regular sinus rhythm without murmurs or gallops. Lungs are clear to auscultation.       Assessment & Plan:  Essential hypertension - Plan: Comprehensive metabolic panel  Vitamin D deficiency - Plan: VITAMIN D 25 Hydroxy (Vit-D Deficiency, Fractures)  She is a pleasant 39 year old female who is new to the practice and here to establish care.  Hypertension appears to be well controlled on amlodipine 5 mg.  Discussed low-sodium diet.  DASH diet handout provided. She is not currently taking a vitamin D supplement.  I will check her vitamin D level and follow-up.

## 2020-05-29 NOTE — Patient Instructions (Signed)
DASH Eating Plan DASH stands for "Dietary Approaches to Stop Hypertension." The DASH eating plan is a healthy eating plan that has been shown to reduce high blood pressure (hypertension). It may also reduce your risk for type 2 diabetes, heart disease, and stroke. The DASH eating plan may also help with weight loss. What are tips for following this plan?  General guidelines  Avoid eating more than 2,300 mg (milligrams) of salt (sodium) a day. If you have hypertension, you may need to reduce your sodium intake to 1,500 mg a day.  Limit alcohol intake to no more than 1 drink a day for nonpregnant women and 2 drinks a day for men. One drink equals 12 oz of beer, 5 oz of wine, or 1 oz of hard liquor.  Work with your health care provider to maintain a healthy body weight or to lose weight. Ask what an ideal weight is for you.  Get at least 30 minutes of exercise that causes your heart to beat faster (aerobic exercise) most days of the week. Activities may include walking, swimming, or biking.  Work with your health care provider or diet and nutrition specialist (dietitian) to adjust your eating plan to your individual calorie needs. Reading food labels   Check food labels for the amount of sodium per serving. Choose foods with less than 5 percent of the Daily Value of sodium. Generally, foods with less than 300 mg of sodium per serving fit into this eating plan.  To find whole grains, look for the word "whole" as the first word in the ingredient list. Shopping  Buy products labeled as "low-sodium" or "no salt added."  Buy fresh foods. Avoid canned foods and premade or frozen meals. Cooking  Avoid adding salt when cooking. Use salt-free seasonings or herbs instead of table salt or sea salt. Check with your health care provider or pharmacist before using salt substitutes.  Do not fry foods. Cook foods using healthy methods such as baking, boiling, grilling, and broiling instead.  Cook with  heart-healthy oils, such as olive, canola, soybean, or sunflower oil. Meal planning  Eat a balanced diet that includes: ? 5 or more servings of fruits and vegetables each day. At each meal, try to fill half of your plate with fruits and vegetables. ? Up to 6-8 servings of whole grains each day. ? Less than 6 oz of lean meat, poultry, or fish each day. A 3-oz serving of meat is about the same size as a deck of cards. One egg equals 1 oz. ? 2 servings of low-fat dairy each day. ? A serving of nuts, seeds, or beans 5 times each week. ? Heart-healthy fats. Healthy fats called Omega-3 fatty acids are found in foods such as flaxseeds and coldwater fish, like sardines, salmon, and mackerel.  Limit how much you eat of the following: ? Canned or prepackaged foods. ? Food that is high in trans fat, such as fried foods. ? Food that is high in saturated fat, such as fatty meat. ? Sweets, desserts, sugary drinks, and other foods with added sugar. ? Full-fat dairy products.  Do not salt foods before eating.  Try to eat at least 2 vegetarian meals each week.  Eat more home-cooked food and less restaurant, buffet, and fast food.  When eating at a restaurant, ask that your food be prepared with less salt or no salt, if possible. What foods are recommended? The items listed may not be a complete list. Talk with your dietitian about   what dietary choices are best for you. Grains Whole-grain or whole-wheat bread. Whole-grain or whole-wheat pasta. Brown rice. Oatmeal. Quinoa. Bulgur. Whole-grain and low-sodium cereals. Pita bread. Low-fat, low-sodium crackers. Whole-wheat flour tortillas. Vegetables Fresh or frozen vegetables (raw, steamed, roasted, or grilled). Low-sodium or reduced-sodium tomato and vegetable juice. Low-sodium or reduced-sodium tomato sauce and tomato paste. Low-sodium or reduced-sodium canned vegetables. Fruits All fresh, dried, or frozen fruit. Canned fruit in natural juice (without  added sugar). Meat and other protein foods Skinless chicken or turkey. Ground chicken or turkey. Pork with fat trimmed off. Fish and seafood. Egg whites. Dried beans, peas, or lentils. Unsalted nuts, nut butters, and seeds. Unsalted canned beans. Lean cuts of beef with fat trimmed off. Low-sodium, lean deli meat. Dairy Low-fat (1%) or fat-free (skim) milk. Fat-free, low-fat, or reduced-fat cheeses. Nonfat, low-sodium ricotta or cottage cheese. Low-fat or nonfat yogurt. Low-fat, low-sodium cheese. Fats and oils Soft margarine without trans fats. Vegetable oil. Low-fat, reduced-fat, or light mayonnaise and salad dressings (reduced-sodium). Canola, safflower, olive, soybean, and sunflower oils. Avocado. Seasoning and other foods Herbs. Spices. Seasoning mixes without salt. Unsalted popcorn and pretzels. Fat-free sweets. What foods are not recommended? The items listed may not be a complete list. Talk with your dietitian about what dietary choices are best for you. Grains Baked goods made with fat, such as croissants, muffins, or some breads. Dry pasta or rice meal packs. Vegetables Creamed or fried vegetables. Vegetables in a cheese sauce. Regular canned vegetables (not low-sodium or reduced-sodium). Regular canned tomato sauce and paste (not low-sodium or reduced-sodium). Regular tomato and vegetable juice (not low-sodium or reduced-sodium). Pickles. Olives. Fruits Canned fruit in a light or heavy syrup. Fried fruit. Fruit in cream or butter sauce. Meat and other protein foods Fatty cuts of meat. Ribs. Fried meat. Bacon. Sausage. Bologna and other processed lunch meats. Salami. Fatback. Hotdogs. Bratwurst. Salted nuts and seeds. Canned beans with added salt. Canned or smoked fish. Whole eggs or egg yolks. Chicken or turkey with skin. Dairy Whole or 2% milk, cream, and half-and-half. Whole or full-fat cream cheese. Whole-fat or sweetened yogurt. Full-fat cheese. Nondairy creamers. Whipped toppings.  Processed cheese and cheese spreads. Fats and oils Butter. Stick margarine. Lard. Shortening. Ghee. Bacon fat. Tropical oils, such as coconut, palm kernel, or palm oil. Seasoning and other foods Salted popcorn and pretzels. Onion salt, garlic salt, seasoned salt, table salt, and sea salt. Worcestershire sauce. Tartar sauce. Barbecue sauce. Teriyaki sauce. Soy sauce, including reduced-sodium. Steak sauce. Canned and packaged gravies. Fish sauce. Oyster sauce. Cocktail sauce. Horseradish that you find on the shelf. Ketchup. Mustard. Meat flavorings and tenderizers. Bouillon cubes. Hot sauce and Tabasco sauce. Premade or packaged marinades. Premade or packaged taco seasonings. Relishes. Regular salad dressings. Where to find more information:  National Heart, Lung, and Blood Institute: www.nhlbi.nih.gov  American Heart Association: www.heart.org Summary  The DASH eating plan is a healthy eating plan that has been shown to reduce high blood pressure (hypertension). It may also reduce your risk for type 2 diabetes, heart disease, and stroke.  With the DASH eating plan, you should limit salt (sodium) intake to 2,300 mg a day. If you have hypertension, you may need to reduce your sodium intake to 1,500 mg a day.  When on the DASH eating plan, aim to eat more fresh fruits and vegetables, whole grains, lean proteins, low-fat dairy, and heart-healthy fats.  Work with your health care provider or diet and nutrition specialist (dietitian) to adjust your eating plan to your   individual calorie needs. This information is not intended to replace advice given to you by your health care provider. Make sure you discuss any questions you have with your health care provider. Document Revised: 08/13/2017 Document Reviewed: 08/24/2016 Elsevier Patient Education  2020 Elsevier Inc.  

## 2020-05-30 ENCOUNTER — Other Ambulatory Visit: Payer: Self-pay | Admitting: Internal Medicine

## 2020-05-30 DIAGNOSIS — E559 Vitamin D deficiency, unspecified: Secondary | ICD-10-CM

## 2020-05-30 LAB — COMPREHENSIVE METABOLIC PANEL
ALT: 18 IU/L (ref 0–32)
AST: 13 IU/L (ref 0–40)
Albumin/Globulin Ratio: 1.7 (ref 1.2–2.2)
Albumin: 4.4 g/dL (ref 3.8–4.8)
Alkaline Phosphatase: 50 IU/L (ref 44–121)
BUN/Creatinine Ratio: 17 (ref 9–23)
BUN: 12 mg/dL (ref 6–20)
Bilirubin Total: 0.8 mg/dL (ref 0.0–1.2)
CO2: 27 mmol/L (ref 20–29)
Calcium: 9.1 mg/dL (ref 8.7–10.2)
Chloride: 103 mmol/L (ref 96–106)
Creatinine, Ser: 0.7 mg/dL (ref 0.57–1.00)
GFR calc Af Amer: 127 mL/min/{1.73_m2} (ref >59)
GFR calc non Af Amer: 110 mL/min/{1.73_m2} (ref >59)
Globulin, Total: 2.6 g/dL (ref 1.5–4.5)
Glucose: 84 mg/dL (ref 65–99)
Potassium: 4 mmol/L (ref 3.5–5.2)
Sodium: 139 mmol/L (ref 134–144)
Total Protein: 7 g/dL (ref 6.0–8.5)

## 2020-05-30 LAB — VITAMIN D 25 HYDROXY (VIT D DEFICIENCY, FRACTURES): Vit D, 25-Hydroxy: 19.3 ng/mL — ABNORMAL LOW (ref 30.0–100.0)

## 2020-05-30 MED ORDER — VITAMIN D (ERGOCALCIFEROL) 1.25 MG (50000 UNIT) PO CAPS: 50000.0000 [IU] | ORAL_CAPSULE | ORAL | 0 refills | Status: DC

## 2020-05-30 NOTE — Progress Notes (Signed)
Her vitamin D is low but other labs are fine. Please send in vitamin D 50,000 IUs for her to take once weekly. #12. Recheck in 12 weeks and lab visit is fine. Thanks.

## 2020-08-18 ENCOUNTER — Other Ambulatory Visit: Payer: Self-pay | Admitting: Family Medicine

## 2020-08-28 ENCOUNTER — Other Ambulatory Visit: Payer: Self-pay

## 2020-08-28 ENCOUNTER — Other Ambulatory Visit (INDEPENDENT_AMBULATORY_CARE_PROVIDER_SITE_OTHER): Payer: 59

## 2020-08-28 DIAGNOSIS — Z23 Encounter for immunization: Secondary | ICD-10-CM | POA: Diagnosis not present

## 2020-08-28 DIAGNOSIS — E559 Vitamin D deficiency, unspecified: Secondary | ICD-10-CM

## 2020-08-29 ENCOUNTER — Other Ambulatory Visit: Payer: Self-pay | Admitting: Internal Medicine

## 2020-08-29 LAB — VITAMIN D 25 HYDROXY (VIT D DEFICIENCY, FRACTURES): Vit D, 25-Hydroxy: 28.2 ng/mL — ABNORMAL LOW (ref 30.0–100.0)

## 2020-08-29 MED ORDER — VITAMIN D (ERGOCALCIFEROL) 1.25 MG (50000 UNIT) PO CAPS: 50000.0000 [IU] | ORAL_CAPSULE | ORAL | 0 refills | Status: DC

## 2020-08-29 NOTE — Progress Notes (Signed)
Ok to refill her vitamin D 50,000 IUs once weekly x 12 weeks.

## 2020-10-22 ENCOUNTER — Ambulatory Visit: Payer: 59

## 2020-10-23 ENCOUNTER — Ambulatory Visit (INDEPENDENT_AMBULATORY_CARE_PROVIDER_SITE_OTHER): Payer: 59

## 2020-10-23 ENCOUNTER — Other Ambulatory Visit: Payer: Self-pay

## 2020-10-23 DIAGNOSIS — Z23 Encounter for immunization: Secondary | ICD-10-CM

## 2020-10-30 ENCOUNTER — Ambulatory Visit: Payer: 59

## 2020-11-26 ENCOUNTER — Ambulatory Visit: Payer: 59 | Admitting: Family Medicine

## 2020-11-27 ENCOUNTER — Ambulatory Visit: Payer: 59 | Admitting: Family Medicine

## 2020-12-03 ENCOUNTER — Ambulatory Visit (INDEPENDENT_AMBULATORY_CARE_PROVIDER_SITE_OTHER): Payer: 59 | Admitting: Family Medicine

## 2020-12-03 ENCOUNTER — Other Ambulatory Visit: Payer: Self-pay

## 2020-12-03 ENCOUNTER — Encounter: Payer: Self-pay | Admitting: Family Medicine

## 2020-12-03 VITALS — BP 140/90 | HR 77 | Wt 126.0 lb

## 2020-12-03 DIAGNOSIS — I10 Essential (primary) hypertension: Secondary | ICD-10-CM | POA: Diagnosis not present

## 2020-12-03 DIAGNOSIS — R233 Spontaneous ecchymoses: Secondary | ICD-10-CM

## 2020-12-03 NOTE — Progress Notes (Signed)
   Subjective:    Patient ID: Holly Oconnor, female    DOB: 1981/04/14, 40 y.o.   MRN: [id number]  HPI Chief Complaint  Patient presents with  . 6 month follow-up    6 month follow-up on bp. Haven't taken med in a while   She is here to follow up on HTN.  States she has not been taking her blood pressure medication regularly.  States she takes it "when I remember it". She does not check her blood pressure outside of here.  States approximately 2 weeks ago she noticed a bruise on her right upper arm.  She is not aware of any trauma to the area.  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  Denies bruising any where else and no bleeding concerns.  Denies fever, chills, dizziness, chest pain, palpitations, shortness of breath, abdominal pain, N/V/D,  LE edema.     Review of Systems Pertinent positives and negatives in the history of present illness.     Objective:   Physical Exam BP 140/90   Pulse 77   Wt 126 lb (57.2 kg)   BMI 22.32 kg/m   Alert and oriented and in no acute distress. Respirations unlabored. Bruise in healing stage (yellowish) to right anterior upper arm. Skin unremarkable otherwise.       Assessment & Plan:  Essential hypertension  Spontaneous bruising  Declines labs today and states she does not have time. She is aware that we will need to check labs at her follow up visit.  Discussed that her blood pressure is elevated and that she needs to take her medication daily around the same time.  I recommend that she set an alarm to remind her and I assisted her with this on her cell phone. Recommend she start checking her blood pressure outside of here. Also recommend that she keep an eye on her skin and if she notices any other bruises to let me know.

## 2020-12-03 NOTE — Patient Instructions (Signed)
Take amlodipine daily around the same time.   Try to check your blood pressure outside of here. Goal is blood pressure is 130/80 or lower.

## 2020-12-10 ENCOUNTER — Other Ambulatory Visit: Payer: Self-pay | Admitting: Family Medicine

## 2020-12-10 NOTE — Telephone Encounter (Signed)
Pt was advised at her appt recently to take over the counter since she comepleted her vitamin d rx

## 2021-01-07 DIAGNOSIS — Z3046 Encounter for surveillance of implantable subdermal contraceptive: Secondary | ICD-10-CM | POA: Diagnosis not present

## 2021-01-07 DIAGNOSIS — Z01419 Encounter for gynecological examination (general) (routine) without abnormal findings: Secondary | ICD-10-CM | POA: Diagnosis not present

## 2021-01-07 DIAGNOSIS — N760 Acute vaginitis: Secondary | ICD-10-CM | POA: Diagnosis not present

## 2021-01-07 DIAGNOSIS — Z01411 Encounter for gynecological examination (general) (routine) with abnormal findings: Secondary | ICD-10-CM | POA: Diagnosis not present

## 2021-01-07 DIAGNOSIS — B373 Candidiasis of vulva and vagina: Secondary | ICD-10-CM | POA: Diagnosis not present

## 2021-01-16 ENCOUNTER — Encounter: Payer: Self-pay | Admitting: Family Medicine

## 2021-01-16 ENCOUNTER — Other Ambulatory Visit: Payer: Self-pay

## 2021-01-16 ENCOUNTER — Ambulatory Visit (INDEPENDENT_AMBULATORY_CARE_PROVIDER_SITE_OTHER): Payer: 59 | Admitting: Family Medicine

## 2021-01-16 VITALS — BP 140/100 | HR 72 | Resp 18 | Ht 63.0 in | Wt 122.6 lb

## 2021-01-16 DIAGNOSIS — Z9119 Patient's noncompliance with other medical treatment and regimen: Secondary | ICD-10-CM | POA: Diagnosis not present

## 2021-01-16 DIAGNOSIS — I10 Essential (primary) hypertension: Secondary | ICD-10-CM | POA: Diagnosis not present

## 2021-01-16 DIAGNOSIS — R233 Spontaneous ecchymoses: Secondary | ICD-10-CM | POA: Diagnosis not present

## 2021-01-16 DIAGNOSIS — Z91199 Patient's noncompliance with other medical treatment and regimen due to unspecified reason: Secondary | ICD-10-CM

## 2021-01-16 NOTE — Patient Instructions (Signed)
Managing Your Hypertension Hypertension, also called high blood pressure, is when the force of the blood pressing against the walls of the arteries is too strong. Arteries are blood vessels that carry blood from your heart throughout your body. Hypertension forces the heart to work harder to pump blood and may cause the arteries to become narrow or stiff. Understanding blood pressure readings Your personal target blood pressure may vary depending on your medical conditions, your age, and other factors. A blood pressure reading includes a higher number over a lower number. Ideally, your blood pressure should be below 120/80. You should know that:  The first, or top, number is called the systolic pressure. It is a measure of the pressure in your arteries as your heart beats.  The second, or bottom number, is called the diastolic pressure. It is a measure of the pressure in your arteries as the heart relaxes. Blood pressure is classified into four stages. Based on your blood pressure reading, your health care provider may use the following stages to determine what type of treatment you need, if any. Systolic pressure and diastolic pressure are measured in a unit called mmHg. Normal  Systolic pressure: below 120.  Diastolic pressure: below 80. Elevated  Systolic pressure: 120-129.  Diastolic pressure: below 80. Hypertension stage 1  Systolic pressure: 130-139.  Diastolic pressure: 80-89. Hypertension stage 2  Systolic pressure: 140 or above.  Diastolic pressure: 90 or above. How can this condition affect me? Managing your hypertension is an important responsibility. Over time, hypertension can damage the arteries and decrease blood flow to important parts of the body, including the brain, heart, and kidneys. Having untreated or uncontrolled hypertension can lead to:  A heart attack.  A stroke.  A weakened blood vessel (aneurysm).  Heart failure.  Kidney damage.  Eye  damage.  Metabolic syndrome.  Memory and concentration problems.  Vascular dementia. What actions can I take to manage this condition? Hypertension can be managed by making lifestyle changes and possibly by taking medicines. Your health care provider will help you make a plan to bring your blood pressure within a normal range. Nutrition  Eat a diet that is high in fiber and potassium, and low in salt (sodium), added sugar, and fat. An example eating plan is called the Dietary Approaches to Stop Hypertension (DASH) diet. To eat this way: ? Eat plenty of fresh fruits and vegetables. Try to fill one-half of your plate at each meal with fruits and vegetables. ? Eat whole grains, such as whole-wheat pasta, brown rice, or whole-grain bread. Fill about one-fourth of your plate with whole grains. ? Eat low-fat dairy products. ? Avoid fatty cuts of meat, processed or cured meats, and poultry with skin. Fill about one-fourth of your plate with lean proteins such as fish, chicken without skin, beans, eggs, and tofu. ? Avoid pre-made and processed foods. These tend to be higher in sodium, added sugar, and fat.  Reduce your daily sodium intake. Most people with hypertension should eat less than 1,500 mg of sodium a day.   Lifestyle  Work with your health care provider to maintain a healthy body weight or to lose weight. Ask what an ideal weight is for you.  Get at least 30 minutes of exercise that causes your heart to beat faster (aerobic exercise) most days of the week. Activities may include walking, swimming, or biking.  Include exercise to strengthen your muscles (resistance exercise), such as weight lifting, as part of your weekly exercise routine. Try   to do these types of exercises for 30 minutes at least 3 days a week.  Do not use any products that contain nicotine or tobacco, such as cigarettes, e-cigarettes, and chewing tobacco. If you need help quitting, ask your health care  provider.  Control any long-term (chronic) conditions you have, such as high cholesterol or diabetes.  Identify your sources of stress and find ways to manage stress. This may include meditation, deep breathing, or making time for fun activities.   Alcohol use  Do not drink alcohol if: ? Your health care provider tells you not to drink. ? You are pregnant, may be pregnant, or are planning to become pregnant.  If you drink alcohol: ? Limit how much you use to:  0-1 drink a day for women.  0-2 drinks a day for men. ? Be aware of how much alcohol is in your drink. In the [place] one drink equals one 12 oz bottle of beer (355 mL), one 5 oz glass of wine (148 mL), or one 1 oz glass of hard liquor (44 mL). Medicines Your health care provider may prescribe medicine if lifestyle changes are not enough to get your blood pressure under control and if:  Your systolic blood pressure is 130 or higher.  Your diastolic blood pressure is 80 or higher. Take medicines only as told by your health care provider. Follow the directions carefully. Blood pressure medicines must be taken as told by your health care provider. The medicine does not work as well when you skip doses. Skipping doses also puts you at risk for problems. Monitoring Before you monitor your blood pressure:  Do not smoke, drink caffeinated beverages, or exercise within 30 minutes before taking a measurement.  Use the bathroom and empty your bladder (urinate).  Sit quietly for at least 5 minutes before taking measurements. Monitor your blood pressure at home as told by your health care provider. To do this:  Sit with your back straight and supported.  Place your feet flat on the floor. Do not cross your legs.  Support your arm on a flat surface, such as a table. Make sure your upper arm is at heart level.  Each time you measure, take two or three readings one minute apart and record the results. You may also need to have your  blood pressure checked regularly by your health care provider.   General information  Talk with your health care provider about your diet, exercise habits, and other lifestyle factors that may be contributing to hypertension.  Review all the medicines you take with your health care provider because there may be side effects or interactions.  Keep all visits as told by your health care provider. Your health care provider can help you create and adjust your plan for managing your high blood pressure. Where to find more information  National Heart, Lung, and Blood Institute: www.nhlbi.nih.gov  American Heart Association: www.heart.org Contact a health care provider if:  You think you are having a reaction to medicines you have taken.  You have repeated (recurrent) headaches.  You feel dizzy.  You have swelling in your ankles.  You have trouble with your vision. Get help right away if:  You develop a severe headache or confusion.  You have unusual weakness or numbness, or you feel faint.  You have severe pain in your chest or abdomen.  You vomit repeatedly.  You have trouble breathing. These symptoms may represent a serious problem that is an emergency. Do not wait   to see if the symptoms will go away. Get medical help right away. Call your local emergency services (911 in the [place] Do not drive yourself to the hospital. Summary  Hypertension is when the force of blood pumping through your arteries is too strong. If this condition is not controlled, it may put you at risk for serious complications.  Your personal target blood pressure may vary depending on your medical conditions, your age, and other factors. For most people, a normal blood pressure is less than 120/80.  Hypertension is managed by lifestyle changes, medicines, or both.  Lifestyle changes to help manage hypertension include losing weight, eating a healthy, low-sodium diet, exercising more, stopping smoking, and  limiting alcohol. This information is not intended to replace advice given to you by your health care provider. Make sure you discuss any questions you have with your health care provider. Document Revised: 10/06/2019 Document Reviewed: 08/01/2019 Elsevier Patient Education  2021 Elsevier Inc.  

## 2021-01-16 NOTE — Progress Notes (Signed)
   Subjective:    Patient ID: Holly Oconnor, female    DOB: [birth year], 40 y.o.   MRN: [id number]  HPI Chief Complaint  Patient presents with  . Hypertension    Follow up for bp. Said she mentioned some bruising last time on right arm, now she has bruising on her left arm.    She is here for a 4-week hypertension follow-up.  At her previous visit she had admittedly not been taking her medication regularly.  I counseled her on good medication compliance, DASH diet and checking her blood pressure outside of here.  Since her previous visit she admits that she has not been taking her medication regularly due not remembering and has not been checking her blood pressure. She is nonchalant about this.   Reports that she has another bruise on her left upper arm and is not sure if she did anything to cause it.   Denies fever, chills, dizziness, chest pain, palpitations, shortness of breath, abdominal pain, N/V/D, LE edema.  No bleeding concerns.     Review of Systems Pertinent positives and negatives in the history of present illness.     Objective:   Physical Exam BP (!) 140/100   Pulse 72   Resp 18   Ht 5\' 3"  (1.6 m)   Wt 122 lb 9.6 oz (55.6 kg)   BMI 21.72 kg/m   Alert and oriented in no acute distress.  Respirations unlabored. Circular purplish bruise on her anterior left upper arm. No other bruises.       Assessment & Plan:  Essential hypertension - Plan: CBC with Differential/Platelet, Comprehensive metabolic panel  Personal history of noncompliance with medical treatment, presenting hazards to health  Spontaneous bruising - Plan: CBC with Differential/Platelet, Comprehensive metabolic panel  Discussed that she is noncompliant with her medications and checking her blood pressure outside of here which makes it very difficult to get her blood pressure under good control.  Counseling done on long-term health consequences associated with uncontrolled hypertension.  I recommend  that she take her medication daily, set an alarm or find a way to remind herself to take her medications.  Unclear as to why she has bruising.  I will check her labs and follow-up.

## 2021-01-17 LAB — COMPREHENSIVE METABOLIC PANEL
ALT: 10 IU/L (ref 0–32)
AST: 14 IU/L (ref 0–40)
Albumin/Globulin Ratio: 1.8 (ref 1.2–2.2)
Albumin: 4.6 g/dL (ref 3.8–4.8)
Alkaline Phosphatase: 50 IU/L (ref 44–121)
BUN/Creatinine Ratio: 15 (ref 9–23)
BUN: 9 mg/dL (ref 6–20)
Bilirubin Total: 1.3 mg/dL — ABNORMAL HIGH (ref 0.0–1.2)
CO2: 23 mmol/L (ref 20–29)
Calcium: 9.4 mg/dL (ref 8.7–10.2)
Chloride: 101 mmol/L (ref 96–106)
Creatinine, Ser: 0.61 mg/dL (ref 0.57–1.00)
Globulin, Total: 2.5 g/dL (ref 1.5–4.5)
Glucose: 88 mg/dL (ref 65–99)
Potassium: 3.9 mmol/L (ref 3.5–5.2)
Sodium: 139 mmol/L (ref 134–144)
Total Protein: 7.1 g/dL (ref 6.0–8.5)
eGFR: 117 mL/min/{1.73_m2} (ref >59)

## 2021-01-17 LAB — CBC WITH DIFFERENTIAL/PLATELET
Basophils Absolute: 0 10*3/uL (ref 0.0–0.2)
Basos: 0 %
EOS (ABSOLUTE): 0 10*3/uL (ref 0.0–0.4)
Eos: 1 %
Hematocrit: 38.6 % (ref 34.0–46.6)
Hemoglobin: 12.9 g/dL (ref 11.1–15.9)
Immature Grans (Abs): 0 10*3/uL (ref 0.0–0.1)
Immature Granulocytes: 0 %
Lymphocytes Absolute: 1.5 10*3/uL (ref 0.7–3.1)
Lymphs: 38 %
MCH: 30.4 pg (ref 26.6–33.0)
MCHC: 33.4 g/dL (ref 31.5–35.7)
MCV: 91 fL (ref 79–97)
Monocytes Absolute: 0.3 10*3/uL (ref 0.1–0.9)
Monocytes: 7 %
Neutrophils Absolute: 2.1 10*3/uL (ref 1.4–7.0)
Neutrophils: 54 %
Platelets: 156 10*3/uL (ref 150–450)
RBC: 4.25 x10E6/uL (ref 3.77–5.28)
RDW: 12.7 % (ref 11.7–15.4)
WBC: 3.9 10*3/uL (ref 3.4–10.8)

## 2021-01-17 NOTE — Progress Notes (Signed)
Her labs are fine. Normal kidney function and platelets. No explanation for her bruising.

## 2021-07-15 DIAGNOSIS — Z419 Encounter for procedure for purposes other than remedying health state, unspecified: Secondary | ICD-10-CM | POA: Diagnosis not present

## 2021-07-29 ENCOUNTER — Ambulatory Visit: Payer: 59 | Admitting: Critical Care Medicine

## 2021-07-31 ENCOUNTER — Encounter: Payer: Self-pay | Admitting: Critical Care Medicine

## 2021-07-31 ENCOUNTER — Ambulatory Visit: Payer: 59 | Attending: Critical Care Medicine | Admitting: Critical Care Medicine

## 2021-07-31 ENCOUNTER — Other Ambulatory Visit: Payer: Self-pay

## 2021-07-31 VITALS — BP 147/97 | HR 55 | Resp 16 | Wt 127.4 lb

## 2021-07-31 DIAGNOSIS — Z23 Encounter for immunization: Secondary | ICD-10-CM | POA: Diagnosis not present

## 2021-07-31 DIAGNOSIS — I10 Essential (primary) hypertension: Secondary | ICD-10-CM

## 2021-07-31 DIAGNOSIS — D573 Sickle-cell trait: Secondary | ICD-10-CM | POA: Diagnosis not present

## 2021-07-31 MED ORDER — AMLODIPINE BESYLATE 10 MG PO TABS: 10.0000 mg | ORAL_TABLET | Freq: Every day | ORAL | 2 refills | Status: DC

## 2021-07-31 NOTE — Assessment & Plan Note (Signed)
Patient has a child that has sickle cell patient has herself sickle cell trait but does not have hematologic abnormalities

## 2021-07-31 NOTE — Progress Notes (Signed)
New Patient Office Visit  Subjective:  Patient ID: Holly Oconnor, female    DOB: 02/24/1981  Age: 40 y.o. MRN: [id number]  CC:  Chief Complaint  Patient presents with   Hypertension   Medication Refill    HPI  This is a primary care to establish visit.  This is a 40 year old woman originally from [place].  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  She has had a history of hypertension and was at her previous primary care practice but the practice closed and she is seeking new primary care.  She was on amlodipine 5 mg daily but has been off this medicine for several months.  She does note occasional anterior chest discomfort does not radiate she denies any belching burping or acid reflux.  She does use quite a bit of salt in her food.  She eats a culturally appropriate diet that consists of a lot of carbohydrates with rice and bread and fried plantain.  She often skips lunch.  On arrival blood pressure 147/97.  Patient has no other complaints  Note does have a history of sickle cell trait   Past Medical History:  Diagnosis Date   AS (sickle cell trait) ([facility]) 12/22/2012   Hypertension     History reviewed. No pertinent surgical history.  Family History  Problem Relation Age of Onset   Diabetes Mother    Diabetes Father     Social History   Socioeconomic History   Marital status: Married    Spouse name: Not on file   Number of children: Not on file   Years of education: Not on file   Highest education level: Not on file  Occupational History   Not on file  Tobacco Use   Smoking status: Never   Smokeless tobacco: Never  Vaping Use   Vaping Use: Never used  Substance and Sexual Activity   Alcohol use: No   Drug use: No   Sexual activity: Yes    Birth control/protection: Implant  Other Topics Concern   Not on file  Social History Narrative   Not on file   Social Determinants of Health   Financial Resource Strain: Not on file  Food Insecurity: Not on file   Transportation Needs: Not on file  Physical Activity: Not on file  Stress: Not on file  Social Connections: Not on file  Intimate Partner Violence: Not on file    ROS Review of Systems  Constitutional:  Negative for chills, diaphoresis and fever.  HENT: Negative.  Negative for congestion, hearing loss, nosebleeds, sore throat and tinnitus.   Eyes: Negative.  Negative for photophobia and redness.  Respiratory: Negative.  Negative for cough, shortness of breath, wheezing and stridor.   Cardiovascular:  Positive for chest pain. Negative for palpitations and leg swelling.  Gastrointestinal: Negative.  Negative for abdominal pain, blood in stool, constipation, diarrhea, nausea and vomiting.  Endocrine: Positive for polyuria. Negative for polydipsia.  Genitourinary: Negative.  Negative for dysuria, flank pain, frequency, hematuria and urgency.  Musculoskeletal:  Positive for back pain. Negative for myalgias and neck pain.  Skin: Negative.  Negative for rash.  Allergic/Immunologic: Negative for environmental allergies.  Neurological: Negative.  Negative for dizziness, tremors, seizures, weakness and headaches.  Hematological:  Does not bruise/bleed easily.  Psychiatric/Behavioral: Negative.  Negative for suicidal ideas. The patient is not nervous/anxious.    Objective:   Today's Vitals: BP (!) 147/97   Pulse (!) 55   Resp 16   Wt 127  lb 6.4 oz (57.8 kg)   SpO2 99%   BMI 22.57 kg/m   Physical Exam Vitals reviewed.  Constitutional:      Appearance: Normal appearance. She is well-developed. She is not diaphoretic.  HENT:     Head: Normocephalic and atraumatic.     Nose: No nasal deformity, septal deviation, mucosal edema or rhinorrhea.     Right Sinus: No maxillary sinus tenderness or frontal sinus tenderness.     Left Sinus: No maxillary sinus tenderness or frontal sinus tenderness.     Mouth/Throat:     Pharynx: No oropharyngeal exudate.  Eyes:     General: No scleral  icterus.    Conjunctiva/sclera: Conjunctivae normal.     Pupils: Pupils are equal, round, and reactive to light.  Neck:     Thyroid: No thyromegaly.     Vascular: No carotid bruit or JVD.     Trachea: Trachea normal. No tracheal tenderness or tracheal deviation.  Cardiovascular:     Rate and Rhythm: Normal rate and regular rhythm.     Chest Wall: PMI is not displaced.     Pulses: Normal pulses. No decreased pulses.     Heart sounds: Normal heart sounds, S1 normal and S2 normal. Heart sounds not distant. No murmur heard. No systolic murmur is present.  No diastolic murmur is present.    No friction rub. No gallop. No S3 or S4 sounds.  Pulmonary:     Effort: No tachypnea, accessory muscle usage or respiratory distress.     Breath sounds: No stridor. No decreased breath sounds, wheezing, rhonchi or rales.  Chest:     Chest wall: No tenderness.  Abdominal:     General: Bowel sounds are normal. There is no distension.     Palpations: Abdomen is soft. Abdomen is not rigid.     Tenderness: There is no abdominal tenderness. There is no guarding or rebound.  Musculoskeletal:        General: Normal range of motion.     Cervical back: Normal range of motion and neck supple. No edema, erythema or rigidity. No muscular tenderness. Normal range of motion.  Lymphadenopathy:     Head:     Right side of head: No submental or submandibular adenopathy.     Left side of head: No submental or submandibular adenopathy.     Cervical: No cervical adenopathy.  Skin:    General: Skin is warm and dry.     Coloration: Skin is not pale.     Findings: No rash.     Nails: There is no clubbing.  Neurological:     General: No focal deficit present.     Mental Status: She is alert and oriented to person, place, and time.     Sensory: No sensory deficit.  Psychiatric:        Mood and Affect: Mood normal.        Speech: Speech normal.        Behavior: Behavior normal.        Thought Content: Thought content  normal.        Judgment: Judgment normal.    Assessment & Plan:   Problem List Items Addressed This Visit       Cardiovascular and Mediastinum   Essential hypertension - Primary    Hypertension currently not controlled not on medications  Patient had recent labs has normal kidney function normal liver function  Plan is to begin amlodipine 10 mg daily and also advised a healthy  diet with reduction in salt intake following a Dash diet      Relevant Medications   amLODipine (NORVASC) 10 MG tablet     Other   AS (sickle cell trait) ([facility])    Patient has a child that has sickle cell patient has herself sickle cell trait but does not have hematologic abnormalities      Other Visit Diagnoses     Need for immunization against influenza       Relevant Orders   Flu Vaccine QUAD 78mo+IM (Fluarix, Fluzone & Alfiuria Quad PF) (Completed)       Outpatient Encounter Medications as of 07/31/2021  Medication Sig   amLODipine (NORVASC) 10 MG tablet Take 1 tablet (10 mg total) by mouth daily. Please fill as a 90 day supply   [DISCONTINUED] Acetaminophen (TYLENOL PO) Take by mouth. (Patient not taking: Reported on 01/16/2021)   [DISCONTINUED] amLODipine (NORVASC) 10 MG tablet Take 0.5 tablets (5 mg total) by mouth daily. Please fill as a 90 day supply (Patient not taking: Reported on 07/31/2021)   [DISCONTINUED] Ibuprofen (ADVIL PO) Take by mouth. (Patient not taking: Reported on 01/16/2021)   No facility-administered encounter medications on file as of 07/31/2021.  Patient agreed to and received a flu vaccine  Follow-up: Return in about 2 months (around 09/30/2021).   Shan Levans, MD

## 2021-07-31 NOTE — Assessment & Plan Note (Signed)
Hypertension currently not controlled not on medications  Patient had recent labs has normal kidney function normal liver function  Plan is to begin amlodipine 10 mg daily and also advised a healthy diet with reduction in salt intake following a Dash diet

## 2021-07-31 NOTE — Patient Instructions (Signed)
Follow a healthy diet as we discussed  Begin amlodipine 10 mg 1 daily for blood pressure  Flu vaccine was given  Check your blood pressure once a day record in a diary at home and bring this back with future next visit which will be with Dr. Delford Field in 2 months  See attachment for particular dietary choices you might want to think about obviously you want to adapted to the type of food you like to eat better associated with your culture  We discussed walking for 30 minutes at least 3-4 times a week for exercise

## 2021-08-14 DIAGNOSIS — Z419 Encounter for procedure for purposes other than remedying health state, unspecified: Secondary | ICD-10-CM | POA: Diagnosis not present

## 2021-09-02 DIAGNOSIS — Z30017 Encounter for initial prescription of implantable subdermal contraceptive: Secondary | ICD-10-CM | POA: Diagnosis not present

## 2021-09-02 DIAGNOSIS — Z3046 Encounter for surveillance of implantable subdermal contraceptive: Secondary | ICD-10-CM | POA: Diagnosis not present

## 2021-09-14 DIAGNOSIS — Z419 Encounter for procedure for purposes other than remedying health state, unspecified: Secondary | ICD-10-CM | POA: Diagnosis not present

## 2021-09-29 DIAGNOSIS — I10 Essential (primary) hypertension: Secondary | ICD-10-CM | POA: Diagnosis not present

## 2021-09-30 ENCOUNTER — Encounter: Payer: Self-pay | Admitting: Critical Care Medicine

## 2021-09-30 ENCOUNTER — Other Ambulatory Visit: Payer: Self-pay | Admitting: Critical Care Medicine

## 2021-09-30 ENCOUNTER — Other Ambulatory Visit: Payer: Self-pay

## 2021-09-30 ENCOUNTER — Ambulatory Visit: Payer: Medicaid Other | Attending: Critical Care Medicine | Admitting: Critical Care Medicine

## 2021-09-30 VITALS — BP 157/90 | HR 65 | Resp 16 | Wt 133.0 lb

## 2021-09-30 DIAGNOSIS — D573 Sickle-cell trait: Secondary | ICD-10-CM | POA: Diagnosis not present

## 2021-09-30 DIAGNOSIS — I10 Essential (primary) hypertension: Secondary | ICD-10-CM

## 2021-09-30 DIAGNOSIS — Z23 Encounter for immunization: Secondary | ICD-10-CM | POA: Diagnosis not present

## 2021-09-30 DIAGNOSIS — Z1159 Encounter for screening for other viral diseases: Secondary | ICD-10-CM

## 2021-09-30 MED ORDER — CHLORTHALIDONE 15 MG PO TABS: 15.0000 mg | ORAL_TABLET | Freq: Every day | ORAL | 2 refills | Status: DC

## 2021-09-30 MED ORDER — BLOOD PRESSURE MONITOR 7 DEVI: 0 refills | Status: DC

## 2021-09-30 MED ORDER — AMLODIPINE BESYLATE 5 MG PO TABS: 5.0000 mg | ORAL_TABLET | Freq: Every day | ORAL | 2 refills | Status: DC

## 2021-09-30 NOTE — Assessment & Plan Note (Signed)
Pneumonia vaccine administered today.  

## 2021-09-30 NOTE — Progress Notes (Deleted)
Established Patient Office Visit  Subjective:  Patient ID: Holly Oconnor, female    DOB: 02/02/1981  Age: 41 y.o. MRN: [id number]  CC: No chief complaint on file.   HPI Holly Oconnor presents for  Pap pcv hcv.  Essential hypertension - Primary       Hypertension currently not controlled not on medications   Patient had recent labs has normal kidney function normal liver function   Plan is to begin amlodipine 10 mg daily and also advised a healthy diet with reduction in salt intake following a Dash diet        Relevant Medications    amLODipine (NORVASC) 10 MG tablet        Other    AS (sickle cell trait) ([facility])      Patient has a child that has sickle cell patient has herself sickle cell trait but does not have hematologic abnormalities        Other Visit Diagnoses       Need for immunization against influenza        Relevant Orders    Flu Vaccine QUAD 62moIM (Fluarix, Fluzone & Alfiuria Quad PF) (Completed)    Past Medical History:  Diagnosis Date   AS (sickle cell trait) ([facility] 12/22/2012   Hypertension     No past surgical history on file.  Family History  Problem Relation Age of Onset   Diabetes Mother    Diabetes Father     Social History   Socioeconomic History   Marital status: Married    Spouse name: Not on file   Number of children: Not on file   Years of education: Not on file   Highest education level: Not on file  Occupational History   Not on file  Tobacco Use   Smoking status: Never   Smokeless tobacco: Never  Vaping Use   Vaping Use: Never used  Substance and Sexual Activity   Alcohol use: No   Drug use: No   Sexual activity: Yes    Birth control/protection: Implant  Other Topics Concern   Not on file  Social History Narrative   Not on file   Social Determinants of Health   Financial Resource Strain: Not on file  Food Insecurity: Not on file  Transportation Needs: Not on file  Physical Activity: Not on file  Stress: Not on  file  Social Connections: Not on file  Intimate Partner Violence: Not on file    Outpatient Medications Prior to Visit  Medication Sig Dispense Refill   amLODipine (NORVASC) 10 MG tablet Take 1 tablet (10 mg total) by mouth daily. Please fill as a 90 day supply 90 tablet 2   No facility-administered medications prior to visit.    No Known Allergies  ROS Review of Systems    Objective:    Physical Exam  There were no vitals taken for this visit. Wt Readings from Last 3 Encounters:  07/31/21 127 lb 6.4 oz (57.8 kg)  01/16/21 122 lb 9.6 oz (55.6 kg)  12/03/20 126 lb (57.2 kg)     Health Maintenance Due  Topic Date Due   Pneumococcal Vaccine [age]88Years old (1 - PCV) Never done   Hepatitis C Screening  Never done   COVID-19 Vaccine (4 - Booster for Pfizer series) 12/18/2020   PAP SMEAR-Modifier  05/31/2021    There are no preventive care reminders to display for this patient.  No results found for: TSH Lab Results  Component Value Date  WBC 3.9 01/16/2021   HGB 12.9 01/16/2021   HCT 38.6 01/16/2021   MCV 91 01/16/2021   PLT 156 01/16/2021   Lab Results  Component Value Date   NA 139 01/16/2021   K 3.9 01/16/2021   CO2 23 01/16/2021   GLUCOSE 88 01/16/2021   BUN 9 01/16/2021   CREATININE 0.61 01/16/2021   BILITOT 1.3 (H) 01/16/2021   ALKPHOS 50 01/16/2021   AST 14 01/16/2021   ALT 10 01/16/2021   PROT 7.1 01/16/2021   ALBUMIN 4.6 01/16/2021   CALCIUM 9.4 01/16/2021   ANIONGAP 8 06/24/2016   EGFR 117 01/16/2021   Lab Results  Component Value Date   CHOL 157 11/15/2019   Lab Results  Component Value Date   HDL 49 11/15/2019   Lab Results  Component Value Date   LDLCALC 91 11/15/2019   Lab Results  Component Value Date   TRIG 88 11/15/2019   Lab Results  Component Value Date   CHOLHDL 3.2 11/15/2019   Lab Results  Component Value Date   HGBA1C 4.8 11/15/2019      Assessment & Plan:   Problem List Items Addressed This Visit    None   No orders of the defined types were placed in this encounter.   Follow-up: No follow-ups on file.    Asencion Noble, MD

## 2021-09-30 NOTE — Patient Instructions (Signed)
A pneumonia vaccine was given  A new blood pressure meter was ordered and sent to [facility] pharmacy [address] in [place]  Change amlodipine to one half of a 10 mg tablet daily your new prescription will be a single 5 mg tablet daily  Begin chlorthalidone 15 mg daily for blood pressure  We will arrange for an appointment with one of the other providers in our clinic for a Pap smear  Continue to follow a healthy diet watch salt intake in your diet  Return to see Dr. Delford Field 3 months in our new location [address] third-floor in the [place]

## 2021-09-30 NOTE — Assessment & Plan Note (Signed)
Change dosage of amlodipine to 5 mg daily in the morning. This should help avoid headaches. Add 15mg  chlorthalidone to medication regimen to get BP to goal.   Rx provided for new home BP cuff. Take blood pressure daily and keep a log.   Continue with lifestyle modifications including healthy diet. Try to reduce the amount of salt added to food.

## 2021-09-30 NOTE — Assessment & Plan Note (Signed)
No noted hematologic abnormalities, continue monitoring with routine labs as planned.

## 2021-09-30 NOTE — Progress Notes (Signed)
Established Patient Office Visit  Subjective:  Patient ID: Holly Oconnor, female    DOB: 03-29-1981  Age: 41 y.o. MRN: [id number]  CC:  Chief Complaint  Patient presents with   Hypertension    HPI Holly Oconnor presents for follow up for hypertension. Her initial BP as taken in office was 157/90. Repeat BP was 145/89. She had been taking Amlodipine 26m for this. However, she states that she has noticed that whenever she takes the entire 147mdose, she gets headaches. Thus, she has been splitting the pills in half, taking 1/2 dose in the morning and 1/2 dose in the evening. She said when she takes it this way, she does not get headaches.   She states that she sometimes takes her blood pressure at home, but has not been keeping track of the readings. She says that her current BP meter is not working very well, and she would like a new one. She continues working on lifestyle management of her blood pressure. She states that she does not eat any processed food, and cooks food from her country each day. She says she does use a fair amount of salt when cooking. She would like to start getting more exercise.  She is due for a pap smear, and is eligible for the pneumonia vaccine which she is interested in getting today.   Past Medical History:  Diagnosis Date   [place]   Hypertension     History reviewed. No pertinent surgical history.  Family History  Problem Relation Age of Onset   Diabetes Mother    Diabetes Father     Social History   Socioeconomic History   Marital status: Married    Spouse name: Not on file   Number of children: Not on file   Years of education: Not on file   Highest education level: Not on file  Occupational History   Not on file  Tobacco Use   Smoking status: Never   Smokeless tobacco: Never  Vaping Use   Vaping Use: Never used  Substance and Sexual Activity   Alcohol use: No   Drug use: No   Sexual activity: Yes     Birth control/protection: Implant  Other Topics Concern   Not on file  Social History Narrative   Not on file   Social Determinants of Health   Financial Resource Strain: Not on file  Food Insecurity: Not on file  Transportation Needs: Not on file  Physical Activity: Not on file  Stress: Not on file  Social Connections: Not on file  Intimate Partner Violence: Not on file    Outpatient Medications Prior to Visit  Medication Sig Dispense Refill   amLODipine (NORVASC) 10 MG tablet Take 1 tablet (10 mg total) by mouth daily. Please fill as a 90 day supply 90 tablet 2   No facility-administered medications prior to visit.    No Known Allergies  ROS Review of Systems  Constitutional:  Negative for appetite change and fatigue.  HENT:  Negative for congestion, hearing loss and rhinorrhea.   Eyes: Negative.   Respiratory:  Negative for cough and shortness of breath.   Cardiovascular:  Negative for chest pain, palpitations and leg swelling.  Gastrointestinal:  Negative for diarrhea, nausea and vomiting.  Endocrine: Negative.   Genitourinary:  Negative for dysuria.  Musculoskeletal:  Negative for back pain.  Skin: Negative.   Neurological:  Positive for headaches.  Psychiatric/Behavioral: Negative.  Objective:    Physical Exam Constitutional:      Appearance: Normal appearance. She is normal weight.  HENT:     Head: Normocephalic and atraumatic.  Cardiovascular:     Rate and Rhythm: Normal rate and regular rhythm.     Heart sounds: Normal heart sounds. No murmur heard.   No friction rub. No gallop.  Pulmonary:     Effort: Pulmonary effort is normal.     Breath sounds: Normal breath sounds. No wheezing, rhonchi or rales.  Musculoskeletal:        General: Normal range of motion.  Skin:    General: Skin is warm and dry.  Neurological:     General: No focal deficit present.     Mental Status: She is alert and oriented to person, place, and time.  Psychiatric:         Mood and Affect: Mood normal.        Behavior: Behavior normal.        Thought Content: Thought content normal.        Judgment: Judgment normal.    BP (!) 157/90    Pulse 65    Resp 16    Wt 133 lb (60.3 kg)    SpO2 98%    BMI 23.56 kg/m  Wt Readings from Last 3 Encounters:  09/30/21 133 lb (60.3 kg)  07/31/21 127 lb 6.4 oz (57.8 kg)  01/16/21 122 lb 9.6 oz (55.6 kg)     Health Maintenance Due  Topic Date Due   COVID-19 Vaccine (4 - Booster for Pfizer series) 12/18/2020   PAP SMEAR-Modifier  05/31/2021    There are no preventive care reminders to display for this patient.  No results found for: TSH Lab Results  Component Value Date   WBC 3.9 01/16/2021   HGB 12.9 01/16/2021   HCT 38.6 01/16/2021   MCV 91 01/16/2021   PLT 156 01/16/2021   Lab Results  Component Value Date   NA 139 01/16/2021   K 3.9 01/16/2021   CO2 23 01/16/2021   GLUCOSE 88 01/16/2021   BUN 9 01/16/2021   CREATININE 0.61 01/16/2021   BILITOT 1.3 (H) 01/16/2021   ALKPHOS 50 01/16/2021   AST 14 01/16/2021   ALT 10 01/16/2021   PROT 7.1 01/16/2021   ALBUMIN 4.6 01/16/2021   CALCIUM 9.4 01/16/2021   ANIONGAP 8 06/24/2016   EGFR 117 01/16/2021   Lab Results  Component Value Date   CHOL 157 11/15/2019   Lab Results  Component Value Date   HDL 49 11/15/2019   Lab Results  Component Value Date   LDLCALC 91 11/15/2019   Lab Results  Component Value Date   TRIG 88 11/15/2019   Lab Results  Component Value Date   CHOLHDL 3.2 11/15/2019   Lab Results  Component Value Date   HGBA1C 4.8 11/15/2019      Assessment & Plan:   Problem List Items Addressed This Visit       Cardiovascular and Mediastinum   Essential hypertension    Change dosage of amlodipine to 5 mg daily in the morning. This should help avoid headaches. Add 54m chlorthalidone to medication regimen to get BP to goal.   Rx provided for new home BP cuff. Take blood pressure daily and keep a log.   Continue  with lifestyle modifications including healthy diet. Try to reduce the amount of salt added to food.       Relevant Medications   amLODipine (NORVASC) 5  MG tablet   chlorthalidone (HYGROTEN) 15 MG tablet     Other   AS (sickle cell trait) (HCC)    No noted hematologic abnormalities, continue monitoring with routine labs as planned.       Need for Streptococcus pneumoniae vaccination    Pneumonia vaccine administered today.       Relevant Orders   Pneumococcal conjugate vaccine 20-valent (Completed)   Other Visit Diagnoses     Need for hepatitis C screening test    -  Primary   Relevant Orders   HCV Ab w Reflex to Quant PCR       Meds ordered this encounter  Medications   Blood Pressure Monitoring (BLOOD PRESSURE MONITOR 7) DEVI    Sig: Use to monitor blood pressure    Dispense:  1 each    Refill:  0    Bill medicaid   amLODipine (NORVASC) 5 MG tablet    Sig: Take 1 tablet (5 mg total) by mouth daily. Please fill as a 90 day supply    Dispense:  90 tablet    Refill:  2    Next fill , reduced dose   chlorthalidone (HYGROTEN) 15 MG tablet    Sig: Take 1 tablet (15 mg total) by mouth daily.    Dispense:  60 tablet    Refill:  2   Pcv 20 given Follow-up: Return in about 3 months (around 12/29/2021).    Asencion Noble, MD

## 2021-09-30 NOTE — Telephone Encounter (Signed)
Pharmacy requesting alternative med

## 2021-10-01 ENCOUNTER — Ambulatory Visit: Payer: Self-pay

## 2021-10-01 NOTE — Telephone Encounter (Signed)
Dr. Delford Field,   Pharmacy is saying chlorthalidone requires a PA. However, chlorthalidone is covered by Medicaid. The reason pt's insurance won't take it is because they will only cover the 12.5 mg or 25 mg doses. If you want to write for 12.5 mg, I've seen them cover the 25mg  tab rx with instructions to take 1/2 tablet daily. If appropriate, can we change?

## 2021-10-01 NOTE — Telephone Encounter (Signed)
Please help me yes agree

## 2021-10-01 NOTE — Telephone Encounter (Signed)
°  Chief Complaint: vaccine reaction Symptoms: arm soreness and warmth, headache, watery eyes, fatigue Frequency: 1 day Pertinent Negatives: Patient denies redness or swelling at site Disposition: [] ED /[] Urgent Care (no appt availability in office) / [] Appointment(In office/virtual)/ []  [facility]/ [x] Home Care/ [] Refused Recommended Disposition /[] [facility]/ []  Follow-up with PCP Additional Notes: Pt is taking Tylenol OTC for symptoms. Advised to call back if symptoms continue or get worse.    Reason for Disposition  Pneumococcal vaccine reactions  Answer Assessment - Initial Assessment Questions 1. SYMPTOMS: "What is the main symptom?" (e.g., redness, swelling, pain)      Weakness, warmth to touch 2. ONSET: "When was the vaccine (shot) given?" "How much later did the sx begin?" (e.g., hours, days ago)      yesterday 3. SEVERITY: "How bad is it?"      Unable to sleep 4. FEVER: "Is there a fever?" If Yes, ask: "What is it, how was it measured, and when did it start?"      Believe so 5. IMMUNIZATIONS GIVEN: "What shots have you recently received?"     PNA 6. PAST REACTIONS: "Have you reacted to immunizations before?" If Yes, ask: "What happened?"     No 7. OTHER SYMPTOMS: "Do you have any other symptoms?"     Headache, eyes watery, weakness  Protocols used: Immunization Reactions-A-AH

## 2021-10-15 DIAGNOSIS — Z419 Encounter for procedure for purposes other than remedying health state, unspecified: Secondary | ICD-10-CM | POA: Diagnosis not present

## 2021-11-12 DIAGNOSIS — Z419 Encounter for procedure for purposes other than remedying health state, unspecified: Secondary | ICD-10-CM | POA: Diagnosis not present

## 2021-11-13 ENCOUNTER — Other Ambulatory Visit: Payer: Self-pay

## 2021-11-13 ENCOUNTER — Encounter: Payer: Self-pay | Admitting: Physician Assistant

## 2021-11-13 ENCOUNTER — Other Ambulatory Visit (HOSPITAL_COMMUNITY)
Admission: RE | Admit: 2021-11-13 | Discharge: 2021-11-13 | Disposition: A | Discharge status: Home / Self Care | Payer: Medicaid Other | Source: Ambulatory Visit | Attending: Physician Assistant | Admitting: Physician Assistant

## 2021-11-13 ENCOUNTER — Ambulatory Visit: Payer: Medicaid Other | Attending: Physician Assistant | Admitting: Physician Assistant

## 2021-11-13 VITALS — BP 151/87 | HR 67 | Wt 128.4 lb

## 2021-11-13 DIAGNOSIS — Z124 Encounter for screening for malignant neoplasm of cervix: Secondary | ICD-10-CM | POA: Insufficient documentation

## 2021-11-13 DIAGNOSIS — I10 Essential (primary) hypertension: Secondary | ICD-10-CM | POA: Diagnosis not present

## 2021-11-13 LAB — HM PAP SMEAR: HM Pap smear: HIGH

## 2021-11-13 NOTE — Progress Notes (Signed)
Patient ID: Holly Oconnor, female   DOB: 07/08/1981, 41 y.o.   MRN: [id number] ? ? ?Holly Oconnor, is a 41 y.o. female ? ?WPY:[id number] ? ?[id number] ? ?DOB - 02-Feb-1981 ? ?Chief Complaint  ?Patient presents with  ? Gynecologic Exam  ?    ? ?Subjective:  ? ?Holly Oconnor is a 41 y.o. female here today for pap smear.  No vaginal discharge or pelvic cramping.  No h/o abnormal pap.  She is just finishing her period.  Did not take BP meds this morning.  Denies HA/CP/dizziness ? ?No problems updated. ? ?ALLERGIES: ?No Known Allergies ? ?PAST MEDICAL HISTORY: ?Past Medical History:  ?Diagnosis Date  ? AS (sickle cell trait) (HCC) 12/22/2012  ? Hypertension   ? ? ?MEDICATIONS AT HOME: ?Prior to Admission medications   ?Medication Sig Start Date End Date Taking? Authorizing Provider  ?amLODipine (NORVASC) 5 MG tablet Take 1 tablet (5 mg total) by mouth daily. Please fill as a 90 day supply 09/30/21  Yes Storm Frisk, MD  ?Blood Pressure Monitoring (BLOOD PRESSURE MONITOR 7) DEVI Use to monitor blood pressure 09/30/21  Yes Storm Frisk, MD  ?chlorthalidone (HYGROTEN) 15 MG tablet Take 1 tablet (15 mg total) by mouth daily. ?Patient not taking: Reported on 11/13/2021 09/30/21   Storm Frisk, MD  ? ? ?ROS: ?Neg HEENT ?Neg resp ?Neg cardiac ?Neg GI ?Neg GU ?Neg MS ?Neg psych ?Neg neuro ? ?Objective:  ? ?Vitals:  ? 11/13/21 1342  ?BP: (!) 151/87  ?Pulse: 67  ?SpO2: 97%  ?Weight: 128 lb 6.4 oz (58.2 kg)  ? ?Exam ?General appearance : Awake, alert, not in any distress. Speech Clear. Not toxic looking ?HEENT: Atraumatic and Normocephalic ?Chest: Good air entry bilaterally, CTAB.  No rales/rhonchi/wheezing ?CVS: S1 S2 regular, no murmurs.  ?XT:KWIOXBDZ WNL-speculum inserted-vaginal rugae was normal.  There was a fair amount of blood in the vault-I used a large swab to clear the cervix as much as possible.  Pap taken.  Bimanual performed and unremarkable ?Extremities: B/L Lower Ext shows no edema, both legs are warm to  touch ?Neurology: Awake alert, and oriented X 3, CN II-XII intact, Non focal ?Skin: No Rash ? ?Data Review ?Lab Results  ?Component Value Date  ? HGBA1C 4.8 11/15/2019  ? ? ?Assessment & Plan  ? ?1. Screening for cervical cancer ?- Cytology - PAP([place]) ? ?2. Essential hypertension ?Take meds regularly and check BP OOO and record and bring to next visit.   ? ?Patient have been counseled extensively about nutrition and exercise. Other issues discussed during this visit include: low cholesterol diet, weight control and daily exercise, foot care, annual eye examinations at Ophthalmology, importance of adherence with medications and regular follow-up. We also discussed long term complications of uncontrolled diabetes and hypertension.  ? ?Return in about 2 months (around 01/13/2022) for Dr Delford Field for BP and bloodwork. ? ?The patient was given clear instructions to go to ER or return to medical center if symptoms don't improve, worsen or new problems develop. The patient verbalized understanding. The patient was told to call to get lab results if they haven't heard anything in the next week.  ? ? ? ? ?Georgian Co, PA-C ?[facility] ?[place], [place] ?[phone]   ?11/13/2021, 1:52 PM  ?

## 2021-11-18 LAB — CYTOLOGY - PAP
Comment: NEGATIVE
Diagnosis: NEGATIVE
High risk HPV: POSITIVE — AB

## 2021-11-19 ENCOUNTER — Other Ambulatory Visit: Payer: Self-pay | Admitting: Physician Assistant

## 2021-11-19 DIAGNOSIS — B977 Papillomavirus as the cause of diseases classified elsewhere: Secondary | ICD-10-CM

## 2021-11-21 ENCOUNTER — Telehealth: Payer: Self-pay | Admitting: Critical Care Medicine

## 2021-11-21 NOTE — Telephone Encounter (Signed)
Copied from CRM [id number]. Topic: General - Other ?>> Nov 21, 2021  4:24 PM McGill, Darlina Rumpf wrote: ?Reason for CRM: Pt requested to speak with Eustace Pen directly in regards to her recent lab results.? ?Pt stated that she spoke with her. Declined to provide any further details. ?

## 2021-11-24 ENCOUNTER — Telehealth: Payer: Self-pay | Admitting: Critical Care Medicine

## 2021-11-24 LAB — RESULTS CONSOLE HPV: CHL HPV: POSITIVE

## 2021-11-24 NOTE — Telephone Encounter (Signed)
Pt is calling to speak to [place] regarding her lab results. Please advise CB- [phone] ?

## 2021-11-25 NOTE — Telephone Encounter (Signed)
Attempt to call patient and address concerns regarding lab test. No answer, Left message on voicemail to return call.  ? ?

## 2021-11-25 NOTE — Telephone Encounter (Addendum)
Spoke to patient.  ?She states she was in touch with GYN office and they told her she does not need an appt.  ? ?Advised patient that at this point we have not received that information. She should wait for the office to contact her regarding the referral and f/u at the end of March if she has not heard back from the GYN office.  ? ? ?Please advise.  ?

## 2021-11-25 NOTE — Telephone Encounter (Signed)
Attempt to call patient and address concerns regarding lab test. No answer, Left message on voicemail to return call.  ? ?

## 2021-11-27 NOTE — Telephone Encounter (Signed)
Addressed  with patient on previous message.  ?Pls see note.  ?

## 2021-12-01 NOTE — Telephone Encounter (Signed)
Per referral coordinator at [facility], her case was reviewed by Dr. Elroy Channel. Patient will need to f/u in 1 year with Co-testing (HPV).  ? ?Pls advise otherwise.  ?

## 2021-12-04 NOTE — Telephone Encounter (Signed)
Left message on voicemail per DPR. Encourage to call if they have additional questions or concerns.  ? ?Per referral coordinator at [facility], her case was reviewed by Dr. Elroy Channel. Patient will need to f/u in 1 year with Co-testing (HPV).  ?  ? ? ?

## 2021-12-13 DIAGNOSIS — Z419 Encounter for procedure for purposes other than remedying health state, unspecified: Secondary | ICD-10-CM | POA: Diagnosis not present

## 2021-12-30 ENCOUNTER — Encounter: Payer: Self-pay | Admitting: *Deleted

## 2022-01-12 DIAGNOSIS — Z419 Encounter for procedure for purposes other than remedying health state, unspecified: Secondary | ICD-10-CM | POA: Diagnosis not present

## 2022-01-27 ENCOUNTER — Ambulatory Visit: Payer: Medicaid Other | Admitting: Critical Care Medicine

## 2022-02-12 DIAGNOSIS — Z419 Encounter for procedure for purposes other than remedying health state, unspecified: Secondary | ICD-10-CM | POA: Diagnosis not present

## 2022-02-13 ENCOUNTER — Ambulatory Visit (HOSPITAL_COMMUNITY)
Admission: EM | Admit: 2022-02-13 | Discharge: 2022-02-13 | Disposition: A | Discharge status: Home / Self Care | Payer: Medicaid Other | Attending: Family Medicine | Admitting: Family Medicine

## 2022-02-13 ENCOUNTER — Encounter (HOSPITAL_COMMUNITY): Payer: Self-pay | Admitting: Emergency Medicine

## 2022-02-13 ENCOUNTER — Ambulatory Visit (INDEPENDENT_AMBULATORY_CARE_PROVIDER_SITE_OTHER): Payer: Medicaid Other

## 2022-02-13 DIAGNOSIS — R0789 Other chest pain: Secondary | ICD-10-CM

## 2022-02-13 DIAGNOSIS — S29011A Strain of muscle and tendon of front wall of thorax, initial encounter: Secondary | ICD-10-CM

## 2022-02-13 MED ORDER — TIZANIDINE HCL 4 MG PO TABS: 4.0000 mg | ORAL_TABLET | Freq: Four times a day (QID) | ORAL | 0 refills | Status: DC | PRN

## 2022-02-13 MED ORDER — NAPROXEN 375 MG PO TABS: 375.0000 mg | ORAL_TABLET | Freq: Two times a day (BID) | ORAL | 0 refills | Status: DC

## 2022-02-13 NOTE — ED Triage Notes (Signed)
Pt reports intermittent chest pains that started yesterday. Reports pain happens when moving arms around. Reports pain is similar as when carried baby in car seat.

## 2022-02-13 NOTE — ED Provider Notes (Signed)
[facility]    CSN: [id number] Arrival date & time: 02/13/22  1445      History   Chief Complaint Chief Complaint  Patient presents with   Chest Pain    HPI Holly Oconnor is a 41 y.o. female.   HPI Patient presents for evaluation of intermittent chest pain x one day. The pain is present with movement, and exacerbated by extending her arms or internally rotating her shoulders.  Pain initially was on the left side and is now on the right side.  She did take ibuprofen which resolved the pain on the right side and now she is having pain on the left.  She characterizes the pain as sharp and brief.  Pain only occurs with movement and or talking.  She denies any associated shortness of breath.  She is asymptomatic at present. Past Medical History:  Diagnosis Date   AS (sickle cell trait) (HCC) 12/22/2012   Hypertension     Patient Active Problem List   Diagnosis Date Noted   Need for Streptococcus pneumoniae vaccination 09/30/2021   Essential hypertension 06/23/2016   Environmental allergies 04/12/2013   AS (sickle cell trait) (HCC) 12/22/2012    History reviewed. No pertinent surgical history.  OB History     Gravida  3   Para  3   Term  3   Preterm  0   AB  0   Living  3      SAB  0   IAB  0   Ectopic  0   Multiple  0   Live Births  3            Home Medications    Prior to Admission medications   Medication Sig Start Date End Date Taking? Authorizing Provider  amLODipine (NORVASC) 5 MG tablet Take 1 tablet (5 mg total) by mouth daily. Please fill as a 90 day supply 09/30/21   Storm Frisk, MD  Blood Pressure Monitoring (BLOOD PRESSURE MONITOR 7) DEVI Use to monitor blood pressure 09/30/21   Storm Frisk, MD  chlorthalidone (HYGROTEN) 15 MG tablet Take 1 tablet (15 mg total) by mouth daily. Patient not taking: Reported on 11/13/2021 09/30/21   Storm Frisk, MD    Family History Family History  Problem Relation Age of  Onset   Diabetes Mother    Diabetes Father     Social History Social History   Tobacco Use   Smoking status: Never   Smokeless tobacco: Never  Vaping Use   Vaping Use: Never used  Substance Use Topics   Alcohol use: No   Drug use: No     Allergies   Patient has no known allergies.   Review of Systems Review of Systems Pertinent negatives listed in HPI   Physical Exam Triage Vital Signs ED Triage Vitals  Enc Vitals Group     BP      Pulse      Resp      Temp      Temp src      SpO2      Weight      Height      Head Circumference      Peak Flow      Pain Score      Pain Loc      Pain Edu?      Excl. in GC?    No data found.  Updated Vital Signs BP 138/90 (BP Location: Right Arm)  Pulse 66   Temp 98.3 F (36.8 C)   Resp 15   LMP 02/11/2022   SpO2 97%   Visual Acuity Right Eye Distance:   Left Eye Distance:   Bilateral Distance:    Right Eye Near:   Left Eye Near:    Bilateral Near:     Physical Exam Constitutional:      Appearance: She is well-developed. She is not ill-appearing.  HENT:     Head: Normocephalic and atraumatic.  Cardiovascular:     Rate and Rhythm: Normal rate and regular rhythm.     Heart sounds: Normal heart sounds.  Pulmonary:     Effort: Pulmonary effort is normal.  Chest:     Chest wall: No mass, tenderness, crepitus or edema.  Musculoskeletal:        General: Normal range of motion.     Cervical back: Normal range of motion and neck supple.  Skin:    General: Skin is warm and dry.     Capillary Refill: Capillary refill takes less than 2 seconds.  Neurological:     General: No focal deficit present.     Mental Status: She is alert.  Psychiatric:        Mood and Affect: Mood normal.        Behavior: Behavior normal.    UC Treatments / Results  Labs (all labs ordered are listed, but only abnormal results are displayed) Labs Reviewed - No data to display  EKG NSR, 65 BPM, no ST changes or  elevation  Radiology No results found.  Procedures Procedures (including critical care time)  Medications Ordered in UC Medications - No data to display  Initial Impression / Assessment and Plan / UC Course  I have reviewed the triage vital signs and the nursing notes.  Pertinent labs & imaging results that were available during my care of the patient were reviewed by me and considered in my medical decision making (see chart for details).    Chest Wall Muscle Strain  Chest x-ray negative.  ECG, negative for acute ST changes. BP stable. Recommend Naproxen and Tizanidine acutely for pain management. Recommend heat applications to the chest wall and shoulder. If chest pain becomes intense and persistent I would recommend ER evaluation overall I suspect symptoms are more muscular related and should resolve with anti-inflammatories and rest. Final Clinical Impressions(s) / UC Diagnoses   Final diagnoses:  Chest wall muscle strain, initial encounter     Discharge Instructions      Suspect chest wall strain as the source of your pain. I am prescribing you Naproxen 375 mg twice daily 5-7 days. Tizanidine 2 mg at bedtime as needed for acute pain. If the pain worsens or becomes more severe go to the Emergency Department.       ED Prescriptions     Medication Sig Dispense Auth. Provider   naproxen (NAPROSYN) 375 MG tablet Take 1 tablet (375 mg total) by mouth 2 (two) times daily. 20 tablet Bing Neighbors, FNP   tiZANidine (ZANAFLEX) 4 MG tablet Take 1 tablet (4 mg total) by mouth every 6 (six) hours as needed for muscle spasms. 30 tablet Bing Neighbors, FNP      PDMP not reviewed this encounter.   Bing Neighbors, FNP 02/13/22 [id number]

## 2022-02-13 NOTE — Discharge Instructions (Addendum)
Chest x-ray is unremarkable showed no acute abnormality. Suspect chest wall strain as the source of your pain. I am prescribing you Naproxen 375 mg twice daily 5-7 days. Tizanidine 2 mg at bedtime as needed for acute pain. If the pain worsens or becomes more severe go to the Emergency Department.

## 2022-02-17 ENCOUNTER — Ambulatory Visit: Payer: Medicaid Other | Admitting: Critical Care Medicine

## 2022-02-17 NOTE — Progress Notes (Deleted)
Established Patient Office Visit  Subjective:  Patient ID: Holly Oconnor, female    DOB: 06/01/1981  Age: 41 y.o. MRN: [id number]  CC:  No chief complaint on file.   HPI 09/2021 Holly Oconnor presents for follow up for hypertension. Her initial BP as taken in office was 157/90. Repeat BP was 145/89. She had been taking Amlodipine 42m for this. However, she states that she has noticed that whenever she takes the entire 185mdose, she gets headaches. Thus, she has been splitting the pills in half, taking 1/2 dose in the morning and 1/2 dose in the evening. She said when she takes it this way, she does not get headaches.   She states that she sometimes takes her blood pressure at home, but has not been keeping track of the readings. She says that her current BP meter is not working very well, and she would like a new one. She continues working on lifestyle management of her blood pressure. She states that she does not eat any processed food, and cooks food from her country each day. She says she does use a fair amount of salt when cooking. She would like to start getting more exercise.  She is due for a pap smear, and is eligible for the pneumonia vaccine which she is interested in getting today.   6/6  Essential hypertension    Change dosage of amlodipine to 5 mg daily in the morning. This should help avoid headaches. Add 1520mhlorthalidone to medication regimen to get BP to goal.   Rx provided for new home BP cuff. Take blood pressure daily and keep a log.   Continue with lifestyle modifications including healthy diet. Try to reduce the amount of salt added to food.       Relevant Medications   amLODipine (NORVASC) 5 MG tablet   chlorthalidone (HYGROTEN) 15 MG tablet     Other   AS (sickle cell trait) (HCC)    No noted hematologic abnormalities, continue monitoring with routine labs as planned.       Need for Streptococcus pneumoniae vaccination    Pneumonia vaccine  administered today.       Relevant Orders   Pneumococcal conjugate vaccine 20-valent (Completed)   Other Visit Diagnoses     Need for hepatitis C screening test    -  Primary   Relevant Orders   HCV Ab w Reflex to Quant PCR   [facility] did pap 11/2021  neg  Past Medical History:  Diagnosis Date   [place]   Hypertension     No past surgical history on file.  Family History  Problem Relation Age of Onset   Diabetes Mother    Diabetes Father     Social History   Socioeconomic History   Marital status: Married    Spouse name: Not on file   Number of children: Not on file   Years of education: Not on file   Highest education level: Not on file  Occupational History   Not on file  Tobacco Use   Smoking status: Never   Smokeless tobacco: Never  Vaping Use   Vaping Use: Never used  Substance and Sexual Activity   Alcohol use: No   Drug use: No   Sexual activity: Yes    Birth control/protection: Implant  Other Topics Concern   Not on file  Social History Narrative   Not on file   Social Determinants of Health  Financial Resource Strain: Not on file  Food Insecurity: Not on file  Transportation Needs: Not on file  Physical Activity: Not on file  Stress: Not on file  Social Connections: Not on file  Intimate Partner Violence: Not on file    Outpatient Medications Prior to Visit  Medication Sig Dispense Refill   amLODipine (NORVASC) 5 MG tablet Take 1 tablet (5 mg total) by mouth daily. Please fill as a 90 day supply 90 tablet 2   Blood Pressure Monitoring (BLOOD PRESSURE MONITOR 7) DEVI Use to monitor blood pressure 1 each 0   chlorthalidone (HYGROTEN) 15 MG tablet Take 1 tablet (15 mg total) by mouth daily. (Patient not taking: Reported on 11/13/2021) 60 tablet 2   naproxen (NAPROSYN) 375 MG tablet Take 1 tablet (375 mg total) by mouth 2 (two) times daily. 20 tablet 0   tiZANidine (ZANAFLEX) 4 MG tablet Take 1 tablet (4 mg total)  by mouth every 6 (six) hours as needed for muscle spasms. 30 tablet 0   No facility-administered medications prior to visit.    No Known Allergies  ROS Review of Systems  Constitutional:  Negative for appetite change and fatigue.  HENT:  Negative for congestion, hearing loss and rhinorrhea.   Eyes: Negative.   Respiratory:  Negative for cough and shortness of breath.   Cardiovascular:  Negative for chest pain, palpitations and leg swelling.  Gastrointestinal:  Negative for diarrhea, nausea and vomiting.  Endocrine: Negative.   Genitourinary:  Negative for dysuria.  Musculoskeletal:  Negative for back pain.  Skin: Negative.   Neurological:  Positive for headaches.  Psychiatric/Behavioral: Negative.       Objective:    Physical Exam Constitutional:      Appearance: Normal appearance. She is normal weight.  HENT:     Head: Normocephalic and atraumatic.  Cardiovascular:     Rate and Rhythm: Normal rate and regular rhythm.     Heart sounds: Normal heart sounds. No murmur heard.   No friction rub. No gallop.  Pulmonary:     Effort: Pulmonary effort is normal.     Breath sounds: Normal breath sounds. No wheezing, rhonchi or rales.  Musculoskeletal:        General: Normal range of motion.  Skin:    General: Skin is warm and dry.  Neurological:     General: No focal deficit present.     Mental Status: She is alert and oriented to person, place, and time.  Psychiatric:        Mood and Affect: Mood normal.        Behavior: Behavior normal.        Thought Content: Thought content normal.        Judgment: Judgment normal.    LMP 02/11/2022  Wt Readings from Last 3 Encounters:  11/13/21 128 lb 6.4 oz (58.2 kg)  09/30/21 133 lb (60.3 kg)  07/31/21 127 lb 6.4 oz (57.8 kg)     Health Maintenance Due  Topic Date Due   COVID-19 Vaccine (4 - Booster for Pfizer series) 12/18/2020    There are no preventive care reminders to display for this patient.  No results found for:  TSH Lab Results  Component Value Date   WBC 3.9 01/16/2021   HGB 12.9 01/16/2021   HCT 38.6 01/16/2021   MCV 91 01/16/2021   PLT 156 01/16/2021   Lab Results  Component Value Date   NA 139 01/16/2021   K 3.9 01/16/2021   CO2 23 01/16/2021  GLUCOSE 88 01/16/2021   BUN 9 01/16/2021   CREATININE 0.61 01/16/2021   BILITOT 1.3 (H) 01/16/2021   ALKPHOS 50 01/16/2021   AST 14 01/16/2021   ALT 10 01/16/2021   PROT 7.1 01/16/2021   ALBUMIN 4.6 01/16/2021   CALCIUM 9.4 01/16/2021   ANIONGAP 8 06/24/2016   EGFR 117 01/16/2021   Lab Results  Component Value Date   CHOL 157 11/15/2019   Lab Results  Component Value Date   HDL 49 11/15/2019   Lab Results  Component Value Date   LDLCALC 91 11/15/2019   Lab Results  Component Value Date   TRIG 88 11/15/2019   Lab Results  Component Value Date   CHOLHDL 3.2 11/15/2019   Lab Results  Component Value Date   HGBA1C 4.8 11/15/2019      Assessment & Plan:   Problem List Items Addressed This Visit   None  No orders of the defined types were placed in this encounter.  Pcv 20 given Follow-up: No follow-ups on file.    Asencion Noble, MD

## 2022-03-14 DIAGNOSIS — Z419 Encounter for procedure for purposes other than remedying health state, unspecified: Secondary | ICD-10-CM | POA: Diagnosis not present

## 2022-04-14 DIAGNOSIS — Z419 Encounter for procedure for purposes other than remedying health state, unspecified: Secondary | ICD-10-CM | POA: Diagnosis not present

## 2022-04-20 ENCOUNTER — Telehealth: Payer: Self-pay | Admitting: Pharmacist

## 2022-04-20 NOTE — Progress Notes (Unsigned)
Established Patient Office Visit  Subjective:  Patient ID: Holly Oconnor, female    DOB: [birth year]  Age: 41 y.o. MRN: [id number]  CC:  No chief complaint on file.   HPI Holly Oconnor presents for follow up for hypertension. Her initial BP as taken in office was 157/90. Repeat BP was 145/89. She had been taking Amlodipine $RemoveBeforeDEI'10mg'PJzFFrGmADckWzDx$  for this. However, she states that she has noticed that whenever she takes the entire $RemoveBe'10mg'oFLwcgsvN$  dose, she gets headaches. Thus, she has been splitting the pills in half, taking 1/2 dose in the morning and 1/2 dose in the evening. She said when she takes it this way, she does not get headaches.   She states that she sometimes takes her blood pressure at home, but has not been keeping track of the readings. She says that her current BP meter is not working very well, and she would like a new one. She continues working on lifestyle management of her blood pressure. She states that she does not eat any processed food, and cooks food from her country each day. She says she does use a fair amount of salt when cooking. She would like to start getting more exercise.  She is due for a pap smear, and is eligible for the pneumonia vaccine which she is interested in getting today.   8/8  Past Medical History:  Diagnosis Date   AS (sickle cell trait) ([place]) 12/22/2012   Hypertension     No past surgical history on file.  Family History  Problem Relation Age of Onset   Diabetes Mother    Diabetes Father     Social History   Socioeconomic History   Marital status: Married    Spouse name: Not on file   Number of children: Not on file   Years of education: Not on file   Highest education level: Not on file  Occupational History   Not on file  Tobacco Use   Smoking status: Never   Smokeless tobacco: Never  Vaping Use   Vaping Use: Never used  Substance and Sexual Activity   Alcohol use: No   Drug use: No   Sexual activity: Yes    Birth control/protection: Implant   Other Topics Concern   Not on file  Social History Narrative   Not on file   Social Determinants of Health   Financial Resource Strain: Not on file  Food Insecurity: Not on file  Transportation Needs: Not on file  Physical Activity: Not on file  Stress: Not on file  Social Connections: Not on file  Intimate Partner Violence: Not on file    Outpatient Medications Prior to Visit  Medication Sig Dispense Refill   amLODipine (NORVASC) 10 MG tablet Take 10 mg by mouth daily.     Blood Pressure Monitoring (BLOOD PRESSURE MONITOR 7) DEVI Use to monitor blood pressure 1 each 0   chlorthalidone (HYGROTEN) 15 MG tablet Take 1 tablet (15 mg total) by mouth daily. (Patient not taking: Reported on 11/13/2021) 60 tablet 2   medroxyPROGESTERone (PROVERA) 10 MG tablet Take 10 mg by mouth daily.     naproxen (NAPROSYN) 375 MG tablet Take 1 tablet (375 mg total) by mouth 2 (two) times daily. 20 tablet 0   tiZANidine (ZANAFLEX) 4 MG tablet Take 1 tablet (4 mg total) by mouth every 6 (six) hours as needed for muscle spasms. 30 tablet 0   No facility-administered medications prior to visit.    No Known Allergies  ROS Review of Systems  Constitutional:  Negative for appetite change and fatigue.  HENT:  Negative for congestion, hearing loss and rhinorrhea.   Eyes: Negative.   Respiratory:  Negative for cough and shortness of breath.   Cardiovascular:  Negative for chest pain, palpitations and leg swelling.  Gastrointestinal:  Negative for diarrhea, nausea and vomiting.  Endocrine: Negative.   Genitourinary:  Negative for dysuria.  Musculoskeletal:  Negative for back pain.  Skin: Negative.   Neurological:  Positive for headaches.  Psychiatric/Behavioral: Negative.        Objective:    Physical Exam Constitutional:      Appearance: Normal appearance. She is normal weight.  HENT:     Head: Normocephalic and atraumatic.  Cardiovascular:     Rate and Rhythm: Normal rate and regular rhythm.      Heart sounds: Normal heart sounds. No murmur heard.    No friction rub. No gallop.  Pulmonary:     Effort: Pulmonary effort is normal.     Breath sounds: Normal breath sounds. No wheezing, rhonchi or rales.  Musculoskeletal:        General: Normal range of motion.  Skin:    General: Skin is warm and dry.  Neurological:     General: No focal deficit present.     Mental Status: She is alert and oriented to person, place, and time.  Psychiatric:        Mood and Affect: Mood normal.        Behavior: Behavior normal.        Thought Content: Thought content normal.        Judgment: Judgment normal.     There were no vitals taken for this visit. Wt Readings from Last 3 Encounters:  11/13/21 128 lb 6.4 oz (58.2 kg)  09/30/21 133 lb (60.3 kg)  07/31/21 127 lb 6.4 oz (57.8 kg)     Health Maintenance Due  Topic Date Due   COVID-19 Vaccine (4 - Pfizer series) 12/18/2020   INFLUENZA VACCINE  04/14/2022    There are no preventive care reminders to display for this patient.  No results found for: "TSH" Lab Results  Component Value Date   WBC 3.9 01/16/2021   HGB 12.9 01/16/2021   HCT 38.6 01/16/2021   MCV 91 01/16/2021   PLT 156 01/16/2021   Lab Results  Component Value Date   NA 139 01/16/2021   K 3.9 01/16/2021   CO2 23 01/16/2021   GLUCOSE 88 01/16/2021   BUN 9 01/16/2021   CREATININE 0.61 01/16/2021   BILITOT 1.3 (H) 01/16/2021   ALKPHOS 50 01/16/2021   AST 14 01/16/2021   ALT 10 01/16/2021   PROT 7.1 01/16/2021   ALBUMIN 4.6 01/16/2021   CALCIUM 9.4 01/16/2021   ANIONGAP 8 06/24/2016   EGFR 117 01/16/2021   Lab Results  Component Value Date   CHOL 157 11/15/2019   Lab Results  Component Value Date   HDL 49 11/15/2019   Lab Results  Component Value Date   LDLCALC 91 11/15/2019   Lab Results  Component Value Date   TRIG 88 11/15/2019   Lab Results  Component Value Date   CHOLHDL 3.2 11/15/2019   Lab Results  Component Value Date   HGBA1C  4.8 11/15/2019      Assessment & Plan:   Problem List Items Addressed This Visit   None  No orders of the defined types were placed in this encounter.  Pcv 20 given Follow-up: No  follow-ups on file.    Holly Noble, MD

## 2022-04-20 NOTE — Telephone Encounter (Signed)
Patient attempted to be outreached to discuss hypertension prior to visit with PCP tomorrow. Voicemail full.   Catie Eppie Gibson, PharmD, [facility] [phone]

## 2022-04-21 ENCOUNTER — Encounter: Payer: Self-pay | Admitting: Critical Care Medicine

## 2022-04-21 ENCOUNTER — Ambulatory Visit: Payer: Medicaid Other | Attending: Critical Care Medicine | Admitting: Critical Care Medicine

## 2022-04-21 VITALS — BP 156/92 | HR 83 | Temp 98.0°F | Resp 15 | Ht 63.0 in | Wt 132.0 lb

## 2022-04-21 DIAGNOSIS — I10 Essential (primary) hypertension: Secondary | ICD-10-CM | POA: Diagnosis not present

## 2022-04-21 MED ORDER — AMLODIPINE BESYLATE 10 MG PO TABS: 10.0000 mg | ORAL_TABLET | Freq: Every day | ORAL | 1 refills | Status: DC

## 2022-04-21 NOTE — Assessment & Plan Note (Addendum)
Not adherent to medications instructed in importance of taking amlodipine daily she will start again at 10 mg daily we will see her back in short-term follow-up with clinical pharmacy The following Lifestyle Medicine recommendations according to American College of Lifestyle Medicine [facility]) were discussed and offered to patient who agrees to start the journey:  A. Whole Foods, Plant-based plate comprising of fruits and vegetables, plant-based proteins, whole-grain carbohydrates was discussed in detail with the patient.   A list for source of those nutrients were also provided to the patient.  Patient will use only water or unsweetened tea for hydration. B.  The need to stay away from risky substances including alcohol, smoking; obtaining 7 to 9 hours of restorative sleep, at least 150 minutes of moderate intensity exercise weekly, the importance of healthy social connections,  and stress reduction techniques were discussed.

## 2022-04-21 NOTE — Patient Instructions (Signed)
Your Pap smear will be repeated in 1 year in March there is no treatment for now of the HPV that was seen in the cervix  Stay on amlodipine 1 pill daily 10 mg refill sent to [facility]  Follow the lifestyle handout we gave you for diet  Return to Dr. Delford Field for recheck in 4 months and see Franky Macho our clinical pharmacist in 4 weeks for your blood pressure

## 2022-05-15 DIAGNOSIS — Z419 Encounter for procedure for purposes other than remedying health state, unspecified: Secondary | ICD-10-CM | POA: Diagnosis not present

## 2022-05-26 ENCOUNTER — Ambulatory Visit: Payer: Medicaid Other | Admitting: Pharmacist

## 2022-06-14 DIAGNOSIS — Z419 Encounter for procedure for purposes other than remedying health state, unspecified: Secondary | ICD-10-CM | POA: Diagnosis not present

## 2022-07-15 DIAGNOSIS — Z419 Encounter for procedure for purposes other than remedying health state, unspecified: Secondary | ICD-10-CM | POA: Diagnosis not present

## 2022-08-14 DIAGNOSIS — Z419 Encounter for procedure for purposes other than remedying health state, unspecified: Secondary | ICD-10-CM | POA: Diagnosis not present

## 2022-09-01 ENCOUNTER — Ambulatory Visit: Payer: Medicaid Other | Admitting: Critical Care Medicine

## 2022-09-14 DIAGNOSIS — Z419 Encounter for procedure for purposes other than remedying health state, unspecified: Secondary | ICD-10-CM | POA: Diagnosis not present

## 2022-09-21 ENCOUNTER — Other Ambulatory Visit: Payer: Self-pay

## 2022-09-21 ENCOUNTER — Ambulatory Visit: Payer: Medicaid Other | Attending: Critical Care Medicine | Admitting: Physician Assistant

## 2022-09-21 ENCOUNTER — Encounter: Payer: Self-pay | Admitting: Physician Assistant

## 2022-09-21 VITALS — BP 118/84 | HR 66 | Wt 135.8 lb

## 2022-09-21 DIAGNOSIS — I10 Essential (primary) hypertension: Secondary | ICD-10-CM | POA: Diagnosis not present

## 2022-09-21 DIAGNOSIS — B977 Papillomavirus as the cause of diseases classified elsewhere: Secondary | ICD-10-CM

## 2022-09-21 MED ORDER — AMLODIPINE BESYLATE 10 MG PO TABS
10.0000 mg | ORAL_TABLET | Freq: Every day | ORAL | 1 refills | Status: DC
  Filled 2022-09-21: qty 90, 90d supply, fill #0

## 2022-09-21 NOTE — Patient Instructions (Addendum)
Continue to Hold blood pressure medicine for 1 week.  Sit still and quiet for 5 mins daily while breathing deeply then check blood pressure. Record numbers.   Blood pressure goal is average of < 130/85.  If your readings average >130/85, resume your blood pressure medications   Hypertension, Adult Hypertension is another name for high blood pressure. High blood pressure forces your heart to work harder to pump blood. This can cause problems over time. There are two numbers in a blood pressure reading. There is a top number (systolic) over a bottom number (diastolic). It is best to have a blood pressure that is below 120/80. What are the causes? The cause of this condition is not known. Some other conditions can lead to high blood pressure. What increases the risk? Some lifestyle factors can make you more likely to develop high blood pressure: Smoking. Not getting enough exercise or physical activity. Being overweight. Having too much fat, sugar, calories, or salt (sodium) in your diet. Drinking too much alcohol. Other risk factors include: Having any of these conditions: Heart disease. Diabetes. High cholesterol. Kidney disease. Obstructive sleep apnea. Having a family history of high blood pressure and high cholesterol. Age. The risk increases with age. Stress. What are the signs or symptoms? High blood pressure may not cause symptoms. Very high blood pressure (hypertensive crisis) may cause: Headache. Fast or uneven heartbeats (palpitations). Shortness of breath. Nosebleed. Vomiting or feeling like you may vomit (nauseous). Changes in how you see. Very bad chest pain. Feeling dizzy. Seizures. How is this treated? This condition is treated by making healthy lifestyle changes, such as: Eating healthy foods. Exercising more. Drinking less alcohol. Your doctor may prescribe medicine if lifestyle changes do not help enough and if: Your top number is above 130. Your bottom  number is above 80. Your personal target blood pressure may vary. Follow these instructions at home: Eating and drinking  If told, follow the DASH eating plan. To follow this plan: Fill one half of your plate at each meal with fruits and vegetables. Fill one fourth of your plate at each meal with whole grains. Whole grains include whole-wheat pasta, brown rice, and whole-grain bread. Eat or drink low-fat dairy products, such as skim milk or low-fat yogurt. Fill one fourth of your plate at each meal with low-fat (lean) proteins. Low-fat proteins include fish, chicken without skin, eggs, beans, and tofu. Avoid fatty meat, cured and processed meat, or chicken with skin. Avoid pre-made or processed food. Limit the amount of salt in your diet to less than 1,500 mg each day. Do not drink alcohol if: Your doctor tells you not to drink. You are pregnant, may be pregnant, or are planning to become pregnant. If you drink alcohol: Limit how much you have to: 0-1 drink a day for women. 0-2 drinks a day for men. Know how much alcohol is in your drink. In the [place] one drink equals one 12 oz bottle of beer (355 mL), one 5 oz glass of wine (148 mL), or one 1 oz glass of hard liquor (44 mL). Lifestyle  Work with your doctor to stay at a healthy weight or to lose weight. Ask your doctor what the best weight is for you. Get at least 30 minutes of exercise that causes your heart to beat faster (aerobic exercise) most days of the week. This may include walking, swimming, or biking. Get at least 30 minutes of exercise that strengthens your muscles (resistance exercise) at least 3 days a  week. This may include lifting weights or doing Pilates. Do not smoke or use any products that contain nicotine or tobacco. If you need help quitting, ask your doctor. Check your blood pressure at home as told by your doctor. Keep all follow-up visits. Medicines Take over-the-counter and prescription medicines only as told  by your doctor. Follow directions carefully. Do not skip doses of blood pressure medicine. The medicine does not work as well if you skip doses. Skipping doses also puts you at risk for problems. Ask your doctor about side effects or reactions to medicines that you should watch for. Contact a doctor if: You think you are having a reaction to the medicine you are taking. You have headaches that keep coming back. You feel dizzy. You have swelling in your ankles. You have trouble with your vision. Get help right away if: You get a very bad headache. You start to feel mixed up (confused). You feel weak or numb. You feel faint. You have very bad pain in your: Chest. Belly (abdomen). You vomit more than once. You have trouble breathing. These symptoms may be an emergency. Get help right away. Call 911. Do not wait to see if the symptoms will go away. Do not drive yourself to the hospital. Summary Hypertension is another name for high blood pressure. High blood pressure forces your heart to work harder to pump blood. For most people, a normal blood pressure is less than 120/80. Making healthy choices can help lower blood pressure. If your blood pressure does not get lower with healthy choices, you may need to take medicine. This information is not intended to replace advice given to you by your health care provider. Make sure you discuss any questions you have with your health care provider. Document Revised: 06/19/2021 Document Reviewed: 06/19/2021 Elsevier Patient Education  2023 [employer].

## 2022-09-21 NOTE — Progress Notes (Signed)
Patient ID: SYLVA OVERLEY, female   DOB: Mar 07, 1981, 42 y.o.   MRN: [id number]   Holly Oconnor, is a 42 y.o. female  WCH:[id number]  [id number]  DOB - 1981-04-19  Chief Complaint  Patient presents with   Hypertension       Subjective:   Holly Oconnor is a 42 y.o. female here today for BP check.  She has not been taking her BP meds in months.  She has not been checking BP OOO.  She is not sure if she knows how to work her cuff and is requesting Rx for a different one if she can't figure it out.    She says she never heard from our office regarding gyn appt and did not want to call and mention it bc she was scared of going to the appt.    No problems updated.  ALLERGIES: No Known Allergies  PAST MEDICAL HISTORY: Past Medical History:  Diagnosis Date   AS (sickle cell trait) (HCC) 12/22/2012   Hypertension     MEDICATIONS AT HOME: Prior to Admission medications   Medication Sig Start Date End Date Taking? Authorizing Provider  Blood Pressure Monitoring (BLOOD PRESSURE MONITOR 7) DEVI Use to monitor blood pressure 09/30/21  Yes Storm Frisk, MD  medroxyPROGESTERone (PROVERA) 10 MG tablet Take 10 mg by mouth daily. 10/09/21  Yes [provider]  amLODipine (NORVASC) 10 MG tablet Take 1 tablet (10 mg total) by mouth daily. 09/21/22   Ansleigh Safer, Marzella Schlein, PA-C    ROS: Neg HEENT Neg resp Neg cardiac Neg GI Neg MS Neg psych Neg neuro  Objective:   Vitals:   09/21/22 1557 09/21/22 1612  BP: (!) 150/96 118/84  Pulse: 66   SpO2: 99%   Weight: 135 lb 12.8 oz (61.6 kg)    Exam General appearance : Awake, alert, not in any distress. Speech Clear. Not toxic looking HEENT: Atraumatic and Normocephalic Neck: Supple, no JVD. No cervical lymphadenopathy.  Chest: Good air entry bilaterally, CTAB.  No rales/rhonchi/wheezing CVS: S1 S2 regular, no murmurs.  Extremities: B/L Lower Ext shows no edema, both legs are warm to touch Neurology: Awake alert, and oriented X 3,  CN II-XII intact, Non focal Skin: No Rash  Data Review Lab Results  Component Value Date   HGBA1C 4.8 11/15/2019    Assessment & Plan   1. Essential hypertension Controlled even not on meds after sitting still and quiet with deep breathing for a few mins.   Continue to Hold blood pressure medicine for 1 week.  Sit still and quiet for 5 mins daily while breathing deeply then check blood pressure. Record numbers.  Blood pressure goal is average of < 130/85.  If your readings average >130/85, resume your blood pressure medications - Comprehensive metabolic panel - Provide patient with blood pressure cuff for home use if needed - amLODipine (NORVASC) 10 MG tablet; Take 1 tablet (10 mg total) by mouth daily.  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  Patient verbalizes understanding.  We discussed HPV infection and ramifications if left unaddressed.   - Ambulatory referral to Gynecology    Return in about 6 months (around 03/22/2023) for PCP for chronic conditions.  The patient was given clear instructions to go to ER or return to medical center if symptoms don't improve, worsen or new problems develop. The patient  verbalized understanding. The patient was told to call to get lab results if they haven't heard anything in the next week.      Freeman Caldron, PA-C [facility] and [place], [place]   09/21/2022, 4:16 PM

## 2022-09-22 LAB — COMPREHENSIVE METABOLIC PANEL
ALT: 11 IU/L (ref 0–32)
AST: 19 IU/L (ref 0–40)
Albumin/Globulin Ratio: 1.8 (ref 1.2–2.2)
Albumin: 4.6 g/dL (ref 3.9–4.9)
Alkaline Phosphatase: 57 IU/L (ref 44–121)
BUN/Creatinine Ratio: 21 (ref 9–23)
BUN: 13 mg/dL (ref 6–24)
Bilirubin Total: 0.6 mg/dL (ref 0.0–1.2)
CO2: 23 mmol/L (ref 20–29)
Calcium: 9.3 mg/dL (ref 8.7–10.2)
Chloride: 105 mmol/L (ref 96–106)
Creatinine, Ser: 0.62 mg/dL (ref 0.57–1.00)
Globulin, Total: 2.6 g/dL (ref 1.5–4.5)
Glucose: 77 mg/dL (ref 70–99)
Potassium: 4.2 mmol/L (ref 3.5–5.2)
Sodium: 139 mmol/L (ref 134–144)
Total Protein: 7.2 g/dL (ref 6.0–8.5)
eGFR: 115 mL/min/{1.73_m2} (ref >59)

## 2022-09-24 ENCOUNTER — Encounter: Payer: Self-pay | Admitting: *Deleted

## 2022-09-28 ENCOUNTER — Other Ambulatory Visit: Payer: Self-pay

## 2022-10-02 ENCOUNTER — Telehealth: Payer: Self-pay

## 2022-10-02 NOTE — Telephone Encounter (Signed)
Pt was called and is aware of results, DOB was confirmed.  ?

## 2022-10-02 NOTE — Telephone Encounter (Signed)
-----  Message from Carilyn Goodpasture, RN sent at 10/01/2022  5:34 PM EST -----  ----- Message ----- From: Mathis Dad Sent: 09/23/2022  12:39 PM EST To: Carilyn Goodpasture, RN  Please call patient.  Kidney, liver, electrolyte, and blood sugar levels are normal.  Follow up as planned.  Thanks, Freeman Caldron, PA-C

## 2022-10-15 DIAGNOSIS — Z419 Encounter for procedure for purposes other than remedying health state, unspecified: Secondary | ICD-10-CM | POA: Diagnosis not present

## 2022-11-02 ENCOUNTER — Ambulatory Visit (HOSPITAL_COMMUNITY)
Admission: EM | Admit: 2022-11-02 | Discharge: 2022-11-02 | Disposition: A | Discharge status: Home / Self Care | Payer: Medicaid Other

## 2022-11-02 NOTE — ED Notes (Signed)
Patient access reports patient is  LWBS

## 2022-11-13 DIAGNOSIS — Z419 Encounter for procedure for purposes other than remedying health state, unspecified: Secondary | ICD-10-CM | POA: Diagnosis not present

## 2022-11-19 ENCOUNTER — Ambulatory Visit (INDEPENDENT_AMBULATORY_CARE_PROVIDER_SITE_OTHER): Payer: Medicaid Other | Admitting: Obstetrics & Gynecology

## 2022-11-19 ENCOUNTER — Other Ambulatory Visit (HOSPITAL_COMMUNITY)
Admission: RE | Admit: 2022-11-19 | Discharge: 2022-11-19 | Disposition: A | Discharge status: Home / Self Care | Payer: BLUE CROSS/BLUE SHIELD | Source: Ambulatory Visit | Attending: Obstetrics & Gynecology | Admitting: Obstetrics & Gynecology

## 2022-11-19 ENCOUNTER — Encounter: Payer: Self-pay | Admitting: Obstetrics & Gynecology

## 2022-11-19 VITALS — BP 149/92 | HR 68 | Ht 63.0 in | Wt 133.0 lb

## 2022-11-19 DIAGNOSIS — Z01419 Encounter for gynecological examination (general) (routine) without abnormal findings: Secondary | ICD-10-CM

## 2022-11-19 DIAGNOSIS — R87618 Other abnormal cytological findings on specimens from cervix uteri: Secondary | ICD-10-CM

## 2022-11-19 DIAGNOSIS — Z1231 Encounter for screening mammogram for malignant neoplasm of breast: Secondary | ICD-10-CM

## 2022-11-19 DIAGNOSIS — Z975 Presence of (intrauterine) contraceptive device: Secondary | ICD-10-CM

## 2022-11-19 NOTE — Progress Notes (Signed)
GYNECOLOGY ANNUAL PREVENTATIVE CARE ENCOUNTER NOTE  History:     Holly Oconnor is a 42 y.o. [id number] female here for a routine annual gynecologic exam.  Current complaints: none.   Denies abnormal vaginal bleeding, discharge, pelvic pain, problems with intercourse or other gynecologic concerns.    Gynecologic History Patient's last menstrual period was 11/16/2022. Contraception: Nexplanon placed 2022 Last Pap: 11/13/2021. Result was normal cytology with positive HPV  Obstetric History OB History  Gravida Para Term Preterm AB Living  '3 3 3 '$ 0 0 3  SAB IAB Ectopic Multiple Live Births  0 0 0 0 3    # Outcome Date GA Lbr Len/2nd Weight Sex Delivery Anes PTL Lv  3 Term 05/22/16 [redacted]w[redacted]d/ 00:06 8 lb 0.2 oz (3.635 kg) M Vag-Spont None  LIV  2 Term 06/04/13 441w0d8:00 / 00:11 7 lb 2.8 oz (3.255 kg) M Vag-Spont None  LIV  1 Term 10/07/10 4059w2d lb (3.175 kg) F Vag-Spont EPI  LIV    Past Medical History:  Diagnosis Date   [place]   Hypertension     History reviewed. No pertinent surgical history.  Current Outpatient Medications on File Prior to Visit  Medication Sig Dispense Refill   amLODipine (NORVASC) 10 MG tablet Take 1 tablet (10 mg total) by mouth daily. (Patient not taking: Reported on 11/19/2022) 90 tablet 1   Blood Pressure Monitoring (BLOOD PRESSURE MONITOR 7) DEVI Use to monitor blood pressure 1 each 0   medroxyPROGESTERone (PROVERA) 10 MG tablet Take 10 mg by mouth daily. (Patient not taking: Reported on 11/19/2022)     No current facility-administered medications on file prior to visit.    No Known Allergies  Social History:  reports that she has never smoked. She has never used smokeless tobacco. She reports that she does not drink alcohol and does not use drugs.  Family History  Problem Relation Age of Onset   Diabetes Mother    Diabetes Father     The following portions of the patient's history were reviewed and updated as  appropriate: allergies, current medications, past family history, past medical history, past social history, past surgical history and problem list.  Review of Systems Pertinent items noted in HPI and remainder of comprehensive ROS otherwise negative.  Physical Exam:  BP (!) 149/92   Pulse 68   Ht '5\' 3"'$  (1.6 m)   Wt 133 lb (60.3 kg)   LMP 11/16/2022   BMI 23.56 kg/m  CONSTITUTIONAL: Well-developed, well-nourished female in no acute distress.  HENT:  Normocephalic, atraumatic, External right and left ear normal.  EYES: Conjunctivae and EOM are normal. Pupils are equal, round, and reactive to light. No scleral icterus.  NECK: Normal range of motion, supple, no masses.  Normal thyroid.  SKIN: Skin is warm and dry. No rash noted. Not diaphoretic. No erythema. No pallor. MUSCULOSKELETAL: Normal range of motion. No tenderness.  No cyanosis, clubbing, or edema. NEUROLOGIC: Alert and oriented to person, place, and time. Normal reflexes, muscle tone coordination.  PSYCHIATRIC: Normal mood and affect. Normal behavior. Normal judgment and thought content. CARDIOVASCULAR: Normal heart rate noted, regular rhythm RESPIRATORY: Clear to auscultation bilaterally. Effort and breath sounds normal, no problems with respiration noted. BREASTS: Symmetric in size. No masses, tenderness, skin changes, nipple drainage, or lymphadenopathy bilaterally. Performed in the presence of a chaperone. ABDOMEN: Soft, no distention noted.  No tenderness, rebound or guarding.  PELVIC: Normal appearing external genitalia and urethral  meatus; normal appearing vaginal mucosa and cervix.  No abnormal vaginal discharge noted.  Pap smear obtained.  Normal uterine size, no other palpable masses, no uterine or adnexal tenderness.  Performed in the presence of a chaperone.   Assessment and Plan:     1. Breast cancer screening by mammogram Mammogram scheduled - MM 3D SCREENING MAMMOGRAM BILATERAL BREAST; Future  2. Nexplanon in  place since 2022 No issues  3. Pap smear abnormality of cervix/human papillomavirus (HPV) positive 4. Well woman exam with routine gynecological exam - Cytology - PAP Will follow up results of pap smear and manage accordingly. Routine preventative health maintenance measures emphasized. Please refer to After Visit Summary for other counseling recommendations.      Verita Schneiders, MD, [place] for [employer], [place]

## 2022-11-25 LAB — CYTOLOGY - PAP
Comment: NEGATIVE
Comment: NEGATIVE
Comment: NEGATIVE
Diagnosis: HIGH — AB
HPV 16: NEGATIVE
HPV 18 / 45: POSITIVE — AB
High risk HPV: POSITIVE — AB

## 2022-11-26 ENCOUNTER — Encounter: Payer: Self-pay | Admitting: Obstetrics & Gynecology

## 2022-11-26 DIAGNOSIS — R87613 High grade squamous intraepithelial lesion on cytologic smear of cervix (HGSIL): Secondary | ICD-10-CM | POA: Insufficient documentation

## 2022-11-26 NOTE — Progress Notes (Signed)
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 This carries a risk of CIN3+ of 30%.  As such colposcopy/treatment is recommended. If patient is done with childbearing, please recommend LEEP (colposcopy is done as part of LEEP, and a bigger specimen is obtained for pathology analysis).  If she (or the provider she is scheduled with) wants colposcopy first, that is acceptable but she will likely still need a LEEP or more afterwards.  Please call to inform patient of results and recommendations. Thank you!   Verita Schneiders, MD, [place] for [employer], [place]

## 2022-11-27 NOTE — Progress Notes (Signed)
TC. No answer. Left HIPAA compliant VM with call back number and request for call ASAP. Pt does not have MyChart.

## 2022-11-27 NOTE — Progress Notes (Signed)
RTC from pt. DX and recommendations discussed. Pt does not understand information discussed despite efforts of RN to explain. Pt would be more comfortable with in person discussion with MD. Pt is not a native [place] speaker and there is somewhat of a language and cultural barrier with telephone communication. RN offered to help pt set up MyChart so that educational information could be sent. Pt declined. Call transferred to schedulers to sched in person appt.

## 2022-12-02 ENCOUNTER — Encounter: Payer: Self-pay | Admitting: Obstetrics and Gynecology

## 2022-12-02 ENCOUNTER — Ambulatory Visit (INDEPENDENT_AMBULATORY_CARE_PROVIDER_SITE_OTHER): Payer: BLUE CROSS/BLUE SHIELD | Admitting: Obstetrics and Gynecology

## 2022-12-02 VITALS — BP 139/87 | HR 73 | Ht 63.0 in | Wt 132.0 lb

## 2022-12-02 DIAGNOSIS — R87613 High grade squamous intraepithelial lesion on cytologic smear of cervix (HGSIL): Secondary | ICD-10-CM | POA: Diagnosis not present

## 2022-12-02 NOTE — Progress Notes (Signed)
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 Patient reports feeling well and is without complaints. She has many questions following nurse call regarding need for procedure  Past Medical History:  Diagnosis Date   AS (sickle cell trait) ([place]) 12/22/2012   Hypertension    No past surgical history on file. Family History  Problem Relation Age of Onset   Diabetes Mother    Diabetes Father    Social History   Tobacco Use   Smoking status: Never   Smokeless tobacco: Never  Vaping Use   Vaping Use: Never used  Substance Use Topics   Alcohol use: No   Drug use: No   ROS See pertinent in HPI. All other systems reviewed and non contributory Blood pressure 139/87, pulse 73, height 5\' 3"  (1.6 m), weight 132 lb (59.9 kg), last menstrual period 11/16/2022.  GENERAL: Well-developed, well-nourished female in no acute distress.  NEURO: alert and oriented x 3   A/P 42 yo with HGSIL and HR HPV here for colposcopy/LEEP - Risks, benefits and alternatives were explained including but not limited to risks of bleeding, infection and damage to vagina and rectum - Patient verbalized understanding and all questions were answered - Discussed benefits of Gardasil vaccine as well and patient opted to research on the topic - Patient plans to return next available for LEEP

## 2022-12-02 NOTE — Progress Notes (Addendum)
42 y.o GYN presents for LEEP Consult for HGSIL with positive HR HPV on PAP.

## 2022-12-14 DIAGNOSIS — Z419 Encounter for procedure for purposes other than remedying health state, unspecified: Secondary | ICD-10-CM | POA: Diagnosis not present

## 2022-12-31 ENCOUNTER — Ambulatory Visit
Admission: RE | Admit: 2022-12-31 | Discharge: 2022-12-31 | Disposition: A | Discharge status: Home / Self Care | Payer: Medicaid Other | Source: Ambulatory Visit | Attending: Obstetrics & Gynecology | Admitting: Obstetrics & Gynecology

## 2022-12-31 DIAGNOSIS — Z1231 Encounter for screening mammogram for malignant neoplasm of breast: Secondary | ICD-10-CM

## 2023-01-04 ENCOUNTER — Other Ambulatory Visit (HOSPITAL_COMMUNITY)
Admission: RE | Admit: 2023-01-04 | Discharge: 2023-01-04 | Disposition: A | Discharge status: Home / Self Care | Payer: Medicaid Other | Source: Ambulatory Visit | Attending: Obstetrics and Gynecology | Admitting: Obstetrics and Gynecology

## 2023-01-04 ENCOUNTER — Ambulatory Visit: Payer: Medicaid Other | Admitting: Obstetrics and Gynecology

## 2023-01-04 ENCOUNTER — Encounter: Payer: Self-pay | Admitting: Obstetrics and Gynecology

## 2023-01-04 VITALS — BP 150/101 | HR 76 | Wt 135.0 lb

## 2023-01-04 DIAGNOSIS — Z23 Encounter for immunization: Secondary | ICD-10-CM | POA: Diagnosis not present

## 2023-01-04 DIAGNOSIS — R87613 High grade squamous intraepithelial lesion on cytologic smear of cervix (HGSIL): Secondary | ICD-10-CM

## 2023-01-04 DIAGNOSIS — Z01812 Encounter for preprocedural laboratory examination: Secondary | ICD-10-CM

## 2023-01-04 DIAGNOSIS — N871 Moderate cervical dysplasia: Secondary | ICD-10-CM | POA: Diagnosis not present

## 2023-01-04 LAB — POCT URINE PREGNANCY: Preg Test, Ur: NEGATIVE

## 2023-01-04 NOTE — Progress Notes (Signed)
42 yo here for scheduled LEEP procedure. Patient reports doing well and is without any complaints.   Past Medical History:  Diagnosis Date   AS (sickle cell trait) 12/22/2012   Hypertension    No past surgical history on file. Family History  Problem Relation Age of Onset   Diabetes Mother    Diabetes Father    Social History   Tobacco Use   Smoking status: Never   Smokeless tobacco: Never  Vaping Use   Vaping Use: Never used  Substance Use Topics   Alcohol use: No   Drug use: No   ROS See pertinent in HPI. All other systems reviewed and non contributory Blood pressure (!) 150/101, pulse 76, weight 135 lb (61.2 kg). GENERAL: Well-developed, well-nourished female in no acute distress.  ABDOMEN: Soft, nontender, nondistended. No organomegaly. PELVIC: Normal external female genitalia. Vagina is pink and rugated.  Normal discharge. Normal appearing cervix.  Chaperone present during the pelvic exam EXTREMITIES: No cyanosis, clubbing, or edema, 2+ distal pulses.  A/P 42 yo with HGSIL positive HRHPV 18/45 on 11/2022 pap smear here for LEEP  Patient identified, informed consent obtained, signed copy in chart, time out performed.  Pap smear reviewed.   Teflon coated speculum with smoke evacuator placed.  Cervix visualized. Acetic acid applied to the cervix with acetowhite lesions noted from 6-9 o'clock Paracervical block placed.  A medium size LOOP used to remove cone of cervix using blend of cut and cautery on LEEP machine.  Edges/Base cauterized with Ball.  Monsel's solution used for hemostasis.  Patient tolerated procedure well.  Patient given post procedure instructions.  Follow up in 6 months for repeat pap or as needed pending pathology results Patient interested in receiving Gardasil vaccine

## 2023-01-07 ENCOUNTER — Telehealth: Payer: Self-pay

## 2023-01-07 LAB — SURGICAL PATHOLOGY

## 2023-01-07 NOTE — Telephone Encounter (Signed)
S/w pt and advised of LEEP results and recommendations, pt voiced understanding

## 2023-01-13 DIAGNOSIS — Z419 Encounter for procedure for purposes other than remedying health state, unspecified: Secondary | ICD-10-CM | POA: Diagnosis not present

## 2023-02-04 ENCOUNTER — Ambulatory Visit: Payer: Medicaid Other

## 2023-02-13 DIAGNOSIS — Z419 Encounter for procedure for purposes other than remedying health state, unspecified: Secondary | ICD-10-CM | POA: Diagnosis not present

## 2023-03-05 ENCOUNTER — Ambulatory Visit (INDEPENDENT_AMBULATORY_CARE_PROVIDER_SITE_OTHER): Payer: Medicaid Other

## 2023-03-05 DIAGNOSIS — Z23 Encounter for immunization: Secondary | ICD-10-CM

## 2023-03-05 DIAGNOSIS — R87613 High grade squamous intraepithelial lesion on cytologic smear of cervix (HGSIL): Secondary | ICD-10-CM

## 2023-03-05 NOTE — Progress Notes (Signed)
Patient presents for second HPV vaccine. Injection given in RD. Patient tolerated well. Patient is already scheduled for next visit

## 2023-03-15 DIAGNOSIS — Z419 Encounter for procedure for purposes other than remedying health state, unspecified: Secondary | ICD-10-CM | POA: Diagnosis not present

## 2023-03-19 ENCOUNTER — Other Ambulatory Visit: Payer: Self-pay

## 2023-03-19 ENCOUNTER — Other Ambulatory Visit: Payer: Self-pay | Admitting: Critical Care Medicine

## 2023-03-19 DIAGNOSIS — I10 Essential (primary) hypertension: Secondary | ICD-10-CM

## 2023-03-19 MED ORDER — AMLODIPINE BESYLATE 10 MG PO TABS
10.0000 mg | ORAL_TABLET | Freq: Every day | ORAL | 0 refills | Status: DC
Start: 2023-03-19 — End: 2023-03-23
  Filled 2023-03-19: qty 90, 90d supply, fill #0

## 2023-03-19 NOTE — Telephone Encounter (Signed)
Medication Refill for: amLODipine (NORVASC) 10 MG tablet  Patient completely out of medication. Pharmacy sent over a request that is showing pending but patient very adamant for me to send a message for this refill since she is out.  Has the patient contacted their pharmacy? Yes.    Preferred Pharmacy (with phone number or street name): [id number] [place], [place] - [address]  Phone: [phone]  Has the patient been seen for an appointment in the last year OR does the patient have an upcoming appointment? Yes.  F/U with PCP on 03/23/2023   Patients callback #: [phone]

## 2023-03-19 NOTE — Telephone Encounter (Signed)
Requested Prescriptions  Pending Prescriptions Disp Refills   amLODipine (NORVASC) 10 MG tablet 90 tablet 0    Sig: Take 1 tablet (10 mg total) by mouth daily.     Cardiovascular: Calcium Channel Blockers 2 Failed - 03/19/2023  3:56 PM      Failed - Last BP in normal range    BP Readings from Last 1 Encounters:  01/04/23 (!) 150/101         Passed - Last Heart Rate in normal range    Pulse Readings from Last 1 Encounters:  01/04/23 76         Passed - Valid encounter within last 6 months    Recent Outpatient Visits           5 months ago Essential hypertension   [place] [facility] [place], Marylene Land [place], [place]   11 months ago Essential hypertension   [place] [facility] Storm Frisk, [place]   1 year ago Screening for cervical cancer   [place] [facility] [place], Marzella Schlein, [place]   1 year ago Essential hypertension   [facility] Storm Frisk, MD   1 year ago Essential hypertension   [place] [facility] Storm Frisk, MD       Future Appointments             In 4 days Storm Frisk, MD [facility]

## 2023-03-19 NOTE — Telephone Encounter (Signed)
Change in dosage on 09/21/22, refusing this request.  Requested Prescriptions  Pending Prescriptions Disp Refills   amLODipine (NORVASC) 5 MG tablet [Pharmacy Med Name: AMLODIPINE BESYLATE 5 MG TAB] 90 tablet 2    Sig: TAKE 1 TABLET (5 MG TOTAL) BY MOUTH DAILY. PLEASE FILL AS A 90 DAY SUPPLY     Cardiovascular: Calcium Channel Blockers 2 Failed - 03/19/2023  2:12 PM      Failed - Last BP in normal range    BP Readings from Last 1 Encounters:  01/04/23 (!) 150/101         Passed - Last Heart Rate in normal range    Pulse Readings from Last 1 Encounters:  01/04/23 76         Passed - Valid encounter within last 6 months    Recent Outpatient Visits           5 months ago Essential hypertension   [place] [facility] [place], Marylene Land [place], [place]   11 months ago Essential hypertension   [place] [facility] Storm Frisk, MD   1 year ago Screening for cervical cancer   [place] [facility] [place], Marzella Schlein, [place]   1 year ago Essential hypertension   [place] [facility] Storm Frisk, MD   1 year ago Essential hypertension   [place] [facility] Storm Frisk, MD       Future Appointments             In 4 days Storm Frisk, MD [facility]

## 2023-03-22 NOTE — Progress Notes (Unsigned)
Established Patient Office Visit  Subjective:  Patient ID: Holly Oconnor, female    DOB: 25-Apr-1981  Age: 42 y.o. MRN: [id number]  CC:  No chief complaint on file.   HPI 09/2021 Holly Oconnor presents for follow up for hypertension. Her initial BP as taken in office was 157/90. Repeat BP was 145/89. She had been taking Amlodipine 10mg  for this. However, she states that she has noticed that whenever she takes the entire 10mg  dose, she gets headaches. Thus, she has been splitting the pills in half, taking 1/2 dose in the morning and 1/2 dose in the evening. She said when she takes it this way, she does not get headaches.   She states that she sometimes takes her blood pressure at home, but has not been keeping track of the readings. She says that her current BP meter is not working very well, and she would like a new one. She continues working on lifestyle management of her blood pressure. She states that she does not eat any processed food, and cooks food from her country each day. She says she does use a fair amount of salt when cooking. She would like to start getting more exercise.  She is due for a pap smear, and is eligible for the pneumonia vaccine which she is interested in getting today.   04/21/22 Patient seen in return follow-up for hypertension on arrival blood pressure 156/90 she has not been compliant with her blood pressure medicine.  She is not using chlorthalidone she takes amlodipine as needed. She complains of bruising in the right upper arm Had a Pap smear done this April by our PA Mcclung found to have HPV inclusions but no malignancy seen she will need close follow-up gynecology denied to see her they want [place] to do another Pap smear in a year and then if persisting changes will be referred to gynecology  03/23/23  Past Medical History:  Diagnosis Date   AS (sickle cell trait) (HCC) 12/22/2012   Hypertension     No past surgical history on file.  Family History   Problem Relation Age of Onset   Diabetes Mother    Diabetes Father     Social History   Socioeconomic History   Marital status: Married    Spouse name: Not on file   Number of children: Not on file   Years of education: Not on file   Highest education level: Not on file  Occupational History   Not on file  Tobacco Use   Smoking status: Never   Smokeless tobacco: Never  Vaping Use   Vaping Use: Never used  Substance and Sexual Activity   Alcohol use: No   Drug use: No   Sexual activity: Yes    Birth control/protection: Implant  Other Topics Concern   Not on file  Social History Narrative   Not on file   Social Determinants of Health   Financial Resource Strain: Not on file  Food Insecurity: Not on file  Transportation Needs: Not on file  Physical Activity: Not on file  Stress: Not on file  Social Connections: Not on file  Intimate Partner Violence: Not on file    Outpatient Medications Prior to Visit  Medication Sig Dispense Refill   amLODipine (NORVASC) 10 MG tablet Take 1 tablet (10 mg total) by mouth daily. 90 tablet 0   Blood Pressure Monitoring (BLOOD PRESSURE MONITOR 7) DEVI Use to monitor blood pressure 1 each 0   medroxyPROGESTERone (PROVERA)  10 MG tablet Take 10 mg by mouth daily.     No facility-administered medications prior to visit.    No Known Allergies  ROS Review of Systems  Constitutional:  Negative for appetite change and fatigue.  HENT:  Negative for congestion, hearing loss and rhinorrhea.   Eyes: Negative.   Respiratory:  Negative for cough and shortness of breath.   Cardiovascular:  Negative for chest pain, palpitations and leg swelling.  Gastrointestinal:  Negative for diarrhea, nausea and vomiting.  Endocrine: Negative.   Genitourinary:  Negative for dysuria.  Musculoskeletal:  Negative for back pain.  Skin: Negative.   Neurological:  Positive for headaches.  Psychiatric/Behavioral: Negative.        Objective:    Physical  Exam Vitals reviewed.  Constitutional:      Appearance: Normal appearance. She is well-developed and normal weight. She is not diaphoretic.  HENT:     Head: Normocephalic and atraumatic.     Nose: No nasal deformity, septal deviation, mucosal edema or rhinorrhea.     Right Sinus: No maxillary sinus tenderness or frontal sinus tenderness.     Left Sinus: No maxillary sinus tenderness or frontal sinus tenderness.     Mouth/Throat:     Pharynx: No oropharyngeal exudate.  Eyes:     General: No scleral icterus.    Conjunctiva/sclera: Conjunctivae normal.     Pupils: Pupils are equal, round, and reactive to light.  Neck:     Thyroid: No thyromegaly.     Vascular: No carotid bruit or JVD.     Trachea: Trachea normal. No tracheal tenderness or tracheal deviation.  Cardiovascular:     Rate and Rhythm: Normal rate and regular rhythm.     Chest Wall: PMI is not displaced.     Pulses: Normal pulses. No decreased pulses.     Heart sounds: Normal heart sounds, S1 normal and S2 normal. Heart sounds not distant. No murmur heard.    No systolic murmur is present.     No diastolic murmur is present.     No friction rub. No gallop. No S3 or S4 sounds.  Pulmonary:     Effort: Pulmonary effort is normal. No tachypnea, accessory muscle usage or respiratory distress.     Breath sounds: Normal breath sounds. No stridor. No decreased breath sounds, wheezing, rhonchi or rales.  Chest:     Chest wall: No tenderness.  Abdominal:     General: Bowel sounds are normal. There is no distension.     Palpations: Abdomen is soft. Abdomen is not rigid.     Tenderness: There is no abdominal tenderness. There is no guarding or rebound.  Musculoskeletal:        General: Normal range of motion.     Cervical back: Normal range of motion and neck supple. No edema, erythema or rigidity. No muscular tenderness. Normal range of motion.  Lymphadenopathy:     Head:     Right side of head: No submental or submandibular  adenopathy.     Left side of head: No submental or submandibular adenopathy.     Cervical: No cervical adenopathy.  Skin:    General: Skin is warm and dry.     Coloration: Skin is not pale.     Findings: No rash.     Nails: There is no clubbing.  Neurological:     General: No focal deficit present.     Mental Status: She is alert and oriented to person, place, and time.  Sensory: No sensory deficit.  Psychiatric:        Mood and Affect: Mood normal.        Speech: Speech normal.        Behavior: Behavior normal.        Thought Content: Thought content normal.        Judgment: Judgment normal.     There were no vitals taken for this visit. Wt Readings from Last 3 Encounters:  01/04/23 135 lb (61.2 kg)  12/02/22 132 lb (59.9 kg)  11/19/22 133 lb (60.3 kg)     Health Maintenance Due  Topic Date Due   COVID-19 Vaccine (4 - 2023-24 season) 05/15/2022   HPV VACCINES (3 - 3-dose SCDM series) 07/06/2023       Topic Date Due   HPV VACCINES (3 - 3-dose SCDM series) 07/06/2023    No results found for: "TSH" Lab Results  Component Value Date   WBC 3.9 01/16/2021   HGB 12.9 01/16/2021   HCT 38.6 01/16/2021   MCV 91 01/16/2021   PLT 156 01/16/2021   Lab Results  Component Value Date   NA 139 09/21/2022   K 4.2 09/21/2022   CO2 23 09/21/2022   GLUCOSE 77 09/21/2022   BUN 13 09/21/2022   CREATININE 0.62 09/21/2022   BILITOT 0.6 09/21/2022   ALKPHOS 57 09/21/2022   AST 19 09/21/2022   ALT 11 09/21/2022   PROT 7.2 09/21/2022   ALBUMIN 4.6 09/21/2022   CALCIUM 9.3 09/21/2022   ANIONGAP 8 06/24/2016   EGFR 115 09/21/2022   Lab Results  Component Value Date   CHOL 157 11/15/2019   Lab Results  Component Value Date   HDL 49 11/15/2019   Lab Results  Component Value Date   LDLCALC 91 11/15/2019   Lab Results  Component Value Date   TRIG 88 11/15/2019   Lab Results  Component Value Date   CHOLHDL 3.2 11/15/2019   Lab Results  Component Value Date    HGBA1C 4.8 11/15/2019      Assessment & Plan:   Problem List Items Addressed This Visit   None No orders of the defined types were placed in this encounter.  Pcv 20 given Follow-up: No follow-ups on file.    Shan Levans, MD

## 2023-03-23 ENCOUNTER — Other Ambulatory Visit: Payer: Self-pay

## 2023-03-23 ENCOUNTER — Ambulatory Visit: Payer: Medicaid Other | Attending: Critical Care Medicine | Admitting: Critical Care Medicine

## 2023-03-23 ENCOUNTER — Encounter: Payer: Self-pay | Admitting: Critical Care Medicine

## 2023-03-23 VITALS — BP 152/85 | HR 68 | Wt 132.8 lb

## 2023-03-23 DIAGNOSIS — I10 Essential (primary) hypertension: Secondary | ICD-10-CM | POA: Diagnosis not present

## 2023-03-23 MED ORDER — BLOOD PRESSURE KIT DEVI: 0 refills | Status: AC

## 2023-03-23 MED ORDER — AMLODIPINE BESYLATE 10 MG PO TABS
10.0000 mg | ORAL_TABLET | Freq: Every day | ORAL | 2 refills | Status: DC
Start: 2023-03-23 — End: 2023-04-13

## 2023-03-23 NOTE — Assessment & Plan Note (Signed)
Not controlled due to lack of access to medications were sent inadvertently to the wrong pharmacy  Will have patient pick up her medicines at [facility] pharmacy today and resume the 10 mg amlodipine and [facility]  We will have [facility] mail her a blood pressure meter covered under Medicaid

## 2023-03-23 NOTE — Patient Instructions (Signed)
Resume amlodipine daily, pick up prescription downstairs, [facility] Blood [facility]  Return for nurse visit to check blood pressure 3 weeks , see Dr Delford Field 2 months

## 2023-03-25 DIAGNOSIS — I10 Essential (primary) hypertension: Secondary | ICD-10-CM | POA: Diagnosis not present

## 2023-04-13 ENCOUNTER — Ambulatory Visit: Payer: Medicaid Other | Attending: Family Medicine

## 2023-04-13 DIAGNOSIS — I10 Essential (primary) hypertension: Secondary | ICD-10-CM | POA: Diagnosis not present

## 2023-04-13 MED ORDER — AMLODIPINE BESYLATE 10 MG PO TABS
5.0000 mg | ORAL_TABLET | Freq: Every day | ORAL | 2 refills | Status: DC
Start: 2023-04-13 — End: 2023-07-28

## 2023-04-13 NOTE — Progress Notes (Signed)
   Blood Pressure Recheck Visit  Name: Holly Oconnor MRN: [id number] Date of Birth: 1980-11-12  Holly Oconnor presents today for Blood Pressure recheck with clinical support staff.  Order for BP recheck by Elsie Lincoln, RN ordered on 04/13/2023.   BP Readings from Last 3 Encounters:  04/13/23 130/86  03/23/23 (!) 152/85  01/04/23 (!) 150/101    Current Outpatient Medications  Medication Sig Dispense Refill   amLODipine (NORVASC) 10 MG tablet Take 0.5 tablets (5 mg total) by mouth daily. 90 tablet 2   Blood Pressure Monitoring (BLOOD PRESSURE KIT) DEVI Use to measure blood pressure 1 each 0   No current facility-administered medications for this visit.    Hypertensive Medication Review: Patient states that they are not taking all their hypertensive medications as prescribed and their last dose of hypertensive medications was  04/11/2023    Documentation of any medication adherence discrepancies: Patient has c/o headache and lightheadedness. States she has not taken BP medications since Sunday.  Provider Recommendation:  Spoke to Dr. Delford Field and they stated: To Decrease Norvasc 10 mg Daily to 5 mg Daily.  Patient education provided  Patient has been scheduled to follow up with Nurse in 2 weeks   Patient has been given provider's recommendations and does not have any questions or concerns at this time. Patient will contact the office for any future questions or concerns.

## 2023-04-15 DIAGNOSIS — Z419 Encounter for procedure for purposes other than remedying health state, unspecified: Secondary | ICD-10-CM | POA: Diagnosis not present

## 2023-04-27 ENCOUNTER — Ambulatory Visit: Payer: Medicaid Other | Attending: Critical Care Medicine

## 2023-04-27 DIAGNOSIS — I10 Essential (primary) hypertension: Secondary | ICD-10-CM | POA: Diagnosis not present

## 2023-04-27 NOTE — Progress Notes (Signed)
   Blood Pressure Recheck Visit  Name: Holly Oconnor MRN: [id number] Date of Birth: 1981/02/10  Holly Oconnor presents today for Blood Pressure recheck with clinical support staff.  Order for BP recheck by Bunnie Philips  ordered on 04/27/2023.   BP Readings from Last 3 Encounters:  04/27/23 132/84  04/13/23 130/86  03/23/23 (!) 152/85    Current Outpatient Medications  Medication Sig Dispense Refill   amLODipine (NORVASC) 10 MG tablet Take 0.5 tablets (5 mg total) by mouth daily. 90 tablet 2   Blood Pressure Monitoring (BLOOD PRESSURE KIT) DEVI Use to measure blood pressure 1 each 0   No current facility-administered medications for this visit.    Hypertensive Medication Review: Patient states that they are taking all their hypertensive medications as prescribed and their last dose of hypertensive medications was  yesterday before bed.    Documentation of any medication adherence discrepancies: none  Provider Recommendation:  Spoke to Dr. Delford Field  and they stated: no changes if BPCall if blood pressure greater than 140/90. Patient blood pressure today is within ordered parameters. Patient is to keep upcoming appointment.   Patient has been scheduled to follow up with Dr.Wright  Patient has been given provider's recommendations and does not have any questions or concerns at this time. Patient will contact the office for any future questions or concerns.

## 2023-05-16 DIAGNOSIS — Z419 Encounter for procedure for purposes other than remedying health state, unspecified: Secondary | ICD-10-CM | POA: Diagnosis not present

## 2023-05-25 ENCOUNTER — Ambulatory Visit: Payer: Medicaid Other | Admitting: Critical Care Medicine

## 2023-06-03 ENCOUNTER — Ambulatory Visit: Payer: Medicaid Other | Admitting: Physician Assistant

## 2023-06-15 DIAGNOSIS — Z419 Encounter for procedure for purposes other than remedying health state, unspecified: Secondary | ICD-10-CM | POA: Diagnosis not present

## 2023-06-23 ENCOUNTER — Ambulatory Visit: Payer: Medicaid Other | Admitting: Physician Assistant

## 2023-07-06 ENCOUNTER — Ambulatory Visit: Payer: Self-pay

## 2023-07-06 ENCOUNTER — Ambulatory Visit: Payer: Medicaid Other

## 2023-07-07 ENCOUNTER — Ambulatory Visit: Payer: Medicaid Other

## 2023-07-08 ENCOUNTER — Ambulatory Visit: Payer: Medicaid Other

## 2023-07-12 ENCOUNTER — Ambulatory Visit: Payer: Medicaid Other

## 2023-07-15 ENCOUNTER — Ambulatory Visit: Payer: Medicaid Other

## 2023-07-15 ENCOUNTER — Ambulatory Visit: Payer: Medicaid Other | Admitting: Physician Assistant

## 2023-07-16 DIAGNOSIS — Z419 Encounter for procedure for purposes other than remedying health state, unspecified: Secondary | ICD-10-CM | POA: Diagnosis not present

## 2023-07-22 ENCOUNTER — Ambulatory Visit: Payer: Medicaid Other

## 2023-07-23 ENCOUNTER — Ambulatory Visit: Payer: Medicaid Other

## 2023-07-23 VITALS — BP 145/82 | HR 69

## 2023-07-23 DIAGNOSIS — Z23 Encounter for immunization: Secondary | ICD-10-CM

## 2023-07-23 NOTE — Progress Notes (Signed)
Patient presents for 3rd HPV vaccine. Inj given in RD. Patient tolerated well. Patient advised to call to schedule AEX at the beginning of the year.

## 2023-07-28 ENCOUNTER — Ambulatory Visit: Payer: Medicaid Other | Attending: Physician Assistant | Admitting: Physician Assistant

## 2023-07-28 ENCOUNTER — Encounter: Payer: Self-pay | Admitting: Physician Assistant

## 2023-07-28 VITALS — BP 140/90 | HR 64 | Wt 136.0 lb

## 2023-07-28 DIAGNOSIS — M62838 Other muscle spasm: Secondary | ICD-10-CM | POA: Diagnosis not present

## 2023-07-28 DIAGNOSIS — Z23 Encounter for immunization: Secondary | ICD-10-CM

## 2023-07-28 DIAGNOSIS — I10 Essential (primary) hypertension: Secondary | ICD-10-CM | POA: Diagnosis not present

## 2023-07-28 MED ORDER — CYCLOBENZAPRINE HCL 5 MG PO TABS
5.0000 mg | ORAL_TABLET | Freq: Every day | ORAL | 1 refills | Status: DC
Start: 2023-07-28 — End: 2023-12-15

## 2023-07-28 MED ORDER — AMLODIPINE BESYLATE 10 MG PO TABS: 5.0000 mg | ORAL_TABLET | Freq: Two times a day (BID) | ORAL | 2 refills | Status: DC

## 2023-07-28 NOTE — Progress Notes (Signed)
Patient ID: Holly Oconnor, female   DOB: 1981/01/23, 42 y.o.   MRN: [id number]   Holly Oconnor, is a 42 y.o. female  WUJ:[id number]  [id number]  DOB - 09-07-1981  Chief Complaint  Patient presents with   Medical Management of Chronic Issues   Shoulder Pain       Subjective:   Holly Oconnor is a 42 y.o. female here today for BP recheck.  She checks Bp occasionally at home and gets 130-150/high 80s to 90s.  She is currently taking 5mg  amlodipine at bedtime.  She says when she tried taking 10mg  at bedtime it gave her a severe HA so she is not open to doing that.  She would like to try taking 5mg  twice daily instead.    She also c/o muscle tension in the B trapezius area that is ongoing.  She uses muscle creams that help some.  NKI.  She has had this for years.  Admits to a lot of stress with being in school, work and caring for her children.  No paresthesias or UE weakness.   No problems updated.  ALLERGIES: No Known Allergies  PAST MEDICAL HISTORY: Past Medical History:  Diagnosis Date   AS (sickle cell trait) (HCC) 12/22/2012   Hypertension     MEDICATIONS AT HOME: Prior to Admission medications   Medication Sig Start Date End Date Taking? Authorizing Provider  Blood Pressure Monitoring (BLOOD PRESSURE KIT) DEVI Use to measure blood pressure 03/23/23  Yes Storm Frisk, MD  cyclobenzaprine (FLEXERIL) 5 MG tablet Take 1 tablet (5 mg total) by mouth at bedtime. Prn muscle spasms 07/28/23  Yes Anders Simmonds, PA-C  amLODipine (NORVASC) 10 MG tablet Take 0.5 tablets (5 mg total) by mouth 2 (two) times daily. 07/28/23   Ashten Sarnowski, Marzella Schlein, PA-C    ROS: Neg HEENT Neg resp Neg cardiac Neg GI Neg GU Neg MS Neg psych Neg neuro  Objective:   Vitals:   07/28/23 1549 07/28/23 1606  BP: (!) 143/97 (!) 140/90  Pulse: 64   SpO2: 98%   Weight: 136 lb (61.7 kg)    Exam General appearance : Awake, alert, not in any distress. Speech Clear. Not toxic looking HEENT:  Atraumatic and Normocephalic Neck: Supple, no JVD. No cervical lymphadenopathy. + trapezius spasm that is tender R>L Chest: Good air entry bilaterally, CTAB.  No rales/rhonchi/wheezing CVS: S1 S2 regular, no murmurs.  Extremities: B/L Lower Ext shows no edema, both legs are warm to touch Neurology: Awake alert, and oriented X 3, CN II-XII intact, Non focal Skin: No Rash  Data Review Lab Results  Component Value Date   HGBA1C 4.8 11/15/2019    Assessment & Plan   1. Essential hypertension Not at goal.  She really is adverse to taking a whole tab at bedtime bc it gives her an excruciating HA but she is open to taking a half tab bid.  We can try this.  Check BP several times a week and record-goal <130/<85 - Basic Metabolic Panel - amLODipine (NORVASC) 10 MG tablet; Take 0.5 tablets (5 mg total) by mouth 2 (two) times daily.  Dispense: 90 tablet; Refill: 2  2. Muscle spasm Stretching, muscle rub creams, massages - cyclobenzaprine (FLEXERIL) 5 MG tablet; Take 1 tablet (5 mg total) by mouth at bedtime. Prn muscle spasms  Dispense: 30 tablet; Refill: 1  3. Needs flu shot Flu shot given    Return in about 6 months (around 01/25/2024) for PCP for chronic conditions-please assign new  PCP.  The patient was given clear instructions to go to ER or return to medical center if symptoms don't improve, worsen or new problems develop. The patient verbalized understanding. The patient was told to call to get lab results if they haven't heard anything in the next week.      Georgian Co, PA-C [facility] and [facility] [place], [place] [phone]   07/28/2023, 4:11 PM

## 2023-07-29 ENCOUNTER — Telehealth: Payer: Self-pay

## 2023-07-29 LAB — BASIC METABOLIC PANEL
BUN/Creatinine Ratio: 14 (ref 9–23)
BUN: 9 mg/dL (ref 6–24)
CO2: 23 mmol/L (ref 20–29)
Calcium: 9.3 mg/dL (ref 8.7–10.2)
Chloride: 101 mmol/L (ref 96–106)
Creatinine, Ser: 0.64 mg/dL (ref 0.57–1.00)
Glucose: 114 mg/dL — ABNORMAL HIGH (ref 70–99)
Potassium: 4.1 mmol/L (ref 3.5–5.2)
Sodium: 137 mmol/L (ref 134–144)
eGFR: 114 mL/min/{1.73_m2} (ref >59)

## 2023-07-29 NOTE — Telephone Encounter (Signed)
Pt was called and is aware of results, DOB was confirmed.  ?

## 2023-07-29 NOTE — Telephone Encounter (Signed)
-----   Message from Georgian Co sent at 07/29/2023  8:18 AM EST ----- Your kidney function is normal so you can continue the amlodipine.  Thanks, Georgian Co, PA-C

## 2023-08-15 DIAGNOSIS — Z419 Encounter for procedure for purposes other than remedying health state, unspecified: Secondary | ICD-10-CM | POA: Diagnosis not present

## 2023-09-15 DIAGNOSIS — Z419 Encounter for procedure for purposes other than remedying health state, unspecified: Secondary | ICD-10-CM | POA: Diagnosis not present

## 2023-10-16 DIAGNOSIS — Z419 Encounter for procedure for purposes other than remedying health state, unspecified: Secondary | ICD-10-CM | POA: Diagnosis not present

## 2023-10-18 ENCOUNTER — Ambulatory Visit: Payer: Self-pay | Admitting: Critical Care Medicine

## 2023-10-18 NOTE — Telephone Encounter (Signed)
  Chief Complaint: armpit/rib pain Symptoms: pain Frequency: a month Pertinent Negatives: Patient denies long trips by plane/car, SOB, CP, heart hx, lung hx,  Disposition: [] ED /[] Urgent Care (no appt availability in office) / [x] Appointment(In office/virtual)/ []  [facility]/ [] Home Care/ [] Refused Recommended Disposition /[] [facility]/ []  Follow-up with PCP Additional Notes: Pt states that she has been having intermittent arm pit pain for about a month.  Pt states no heart/lung hx. Pt states worse when she uses her hand. Denies pregnancy, denies long trips by plane/car. Pt sched for appt, advised to call back should she have worsening s/s. Pt states she understands.   Copied from CRM [id number]. Topic: Clinical - Red Word Triage >> Oct 18, 2023  9:14 AM Geroge Baseman wrote: Red Word that prompted transfer to Nurse Triage: Pain on the "rib side" under her arm pit, comes on all of a sudden usually while she's working. Needing advice. Reason for Disposition  [1] Chest pain from known angina comes and goes AND [2] is NOT happening more often (increasing in frequency) or getting worse (increasing in severity)  Answer Assessment - Initial Assessment Questions 1. LOCATION: "Where does it hurt?"       Rib into arm R sided 2. RADIATION: "Does the pain go anywhere else?" (e.g., into neck, jaw, arms, back)     Goes into shoulder 3. ONSET: "When did the chest pain begin?" (Minutes, hours or days)      Month  4. PATTERN: "Does the pain come and go, or has it been constant since it started?"  "Does it get worse with exertion?"      Constant,  5. DURATION: "How long does it last" (e.g., seconds, minutes, hours)     5-10 minutes 6. SEVERITY: "How bad is the pain?"  (e.g., Scale 1-10; mild, moderate, or severe)    - MILD (1-3): doesn't interfere with normal activities     - MODERATE (4-7): interferes with normal activities or awakens from sleep    - SEVERE (8-10): excruciating pain,  unable to do any normal activities       9 7. CARDIAC RISK FACTORS: "Do you have any history of heart problems or risk factors for heart disease?" (e.g., angina, prior heart attack; diabetes, high blood pressure, high cholesterol, smoker, or strong family history of heart disease)     denies 8. PULMONARY RISK FACTORS: "Do you have any history of lung disease?"  (e.g., blood clots in lung, asthma, emphysema, birth control pills)     denies 9. CAUSE: "What do you think is causing the chest pain?"     denies 10. OTHER SYMPTOMS: "Do you have any other symptoms?" (e.g., dizziness, nausea, vomiting, sweating, fever, difficulty breathing, cough)       Denies 11. PREGNANCY: "Is there any chance you are pregnant?" "When was your last menstrual period?"       denies  Protocols used: Chest Pain-A-AH

## 2023-11-03 ENCOUNTER — Ambulatory Visit: Payer: Medicaid Other | Admitting: Physician Assistant

## 2023-11-03 ENCOUNTER — Ambulatory Visit: Payer: Self-pay | Admitting: Physician Assistant

## 2023-11-13 DIAGNOSIS — Z419 Encounter for procedure for purposes other than remedying health state, unspecified: Secondary | ICD-10-CM | POA: Diagnosis not present

## 2023-12-03 ENCOUNTER — Other Ambulatory Visit: Payer: Self-pay | Admitting: Nurse Practitioner

## 2023-12-03 DIAGNOSIS — Z1231 Encounter for screening mammogram for malignant neoplasm of breast: Secondary | ICD-10-CM

## 2023-12-06 ENCOUNTER — Ambulatory Visit: Payer: Medicaid Other | Admitting: Nurse Practitioner

## 2023-12-15 ENCOUNTER — Ambulatory Visit: Admitting: Physician Assistant

## 2023-12-15 ENCOUNTER — Ambulatory Visit: Attending: Physician Assistant | Admitting: Physician Assistant

## 2023-12-15 VITALS — BP 126/77 | HR 81 | Temp 97.9°F | Ht 63.0 in | Wt 135.0 lb

## 2023-12-15 DIAGNOSIS — I10 Essential (primary) hypertension: Secondary | ICD-10-CM

## 2023-12-15 DIAGNOSIS — M62838 Other muscle spasm: Secondary | ICD-10-CM

## 2023-12-15 MED ORDER — CYCLOBENZAPRINE HCL 5 MG PO TABS: 5.0000 mg | ORAL_TABLET | Freq: Every day | ORAL | 1 refills | Status: AC

## 2023-12-15 MED ORDER — LIDOCAINE 4 % EX PTCH: 1.0000 | MEDICATED_PATCH | CUTANEOUS | 2 refills | Status: DC

## 2023-12-15 NOTE — Patient Instructions (Signed)
 Chest Wall Pain Chest wall pain is pain in or around the bones and muscles of your chest. Sometimes, an injury causes this pain. Excessive coughing or overuse of arm and chest muscles may also cause chest wall pain. Sometimes, the cause may not be known. This pain may take several weeks or longer to get better. Follow these instructions at home: Managing pain, stiffness, and swelling  If directed, put ice on the painful area: Put ice in a plastic bag. Place a towel between your skin and the bag. Leave the ice on for 20 minutes, 2-3 times per day. Activity Rest as told by your health care provider. Avoid activities that cause pain. These include any activities that use your chest muscles or your abdominal and side muscles to lift heavy items. Ask your health care provider what activities are safe for you. General instructions  Take over-the-counter and prescription medicines only as told by your health care provider. Do not use any products that contain nicotine or tobacco, such as cigarettes, e-cigarettes, and chewing tobacco. These can delay healing after injury. If you need help quitting, ask your health care provider. Keep all follow-up visits as told by your health care provider. This is important. Contact a health care provider if: You have a fever. Your chest pain becomes worse. You have new symptoms. Get help right away if: You have nausea or vomiting. You feel sweaty or light-headed. You have a cough with mucus from your lungs (sputum) or you cough up blood. You develop shortness of breath. These symptoms may represent a serious problem that is an emergency. Do not wait to see if the symptoms will go away. Get medical help right away. Call your local emergency services (911 in the [place] Do not drive yourself to the hospital. Summary Chest wall pain is pain in or around the bones and muscles of your chest. Depending on the cause, it may be treated with ice, rest, medicines, and  avoiding activities that cause pain. Contact a health care provider if you have a fever, worsening chest pain, or new symptoms. Get help right away if you feel light-headed or you develop shortness of breath. These symptoms may be an emergency. This information is not intended to replace advice given to you by your health care provider. Make sure you discuss any questions you have with your health care provider. Document Revised: 08/24/2022 Document Reviewed: 08/24/2022 Elsevier Patient Education  2024 [employer].

## 2023-12-15 NOTE — Progress Notes (Signed)
 Patient ID: Holly Oconnor, female   DOB: 12-07-1980, 43 y.o.   MRN: [id number]   Holly Oconnor, is a 43 y.o. female  [id number]  HQI:[id number]  DOB - [birth year]  Chief Complaint  Patient presents with   Flank Pain    Intermittent flank pain        Subjective:   Holly Oconnor is a 43 y.o. female here today for R rib/chest wall pain that is intermittent.  It comes and goes.  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  She denies CP/SOB.  The pain is sharp and when she gets it it may last 2 to 3 days.  No rash.  Pain is worse with movement.  She had some flexeril left over from previous neck spasms and took it and it helped.  Does not have the pain today but had already scheduled the appt a few weeks ago.  Amlodipine has 2 RF on it.     No problems updated.  ALLERGIES: No Known Allergies  PAST MEDICAL HISTORY: Past Medical History:  Diagnosis Date   AS (sickle cell trait) (HCC) 12/22/2012   Hypertension     MEDICATIONS AT HOME: Prior to Admission medications   Medication Sig Start Date End Date Taking? Authorizing Provider  amLODipine (NORVASC) 10 MG tablet Take 0.5 tablets (5 mg total) by mouth 2 (two) times daily. 07/28/23  Yes Anders Simmonds, PA-C  Blood Pressure Monitoring (BLOOD PRESSURE KIT) DEVI Use to measure blood pressure 03/23/23  Yes Storm Frisk, MD  lidocaine 4 % Place 1 patch onto the skin daily. Prn pain 12/15/23  Yes Irl Bodie M, PA-C  cyclobenzaprine (FLEXERIL) 5 MG tablet Take 1 tablet (5 mg total) by mouth at bedtime. Prn muscle spasms 12/15/23   Anders Simmonds, PA-C    ROS: Neg HEENT Neg resp Neg cardiac Neg GI Neg GU Neg psych Neg neuro  Objective:   Vitals:   12/15/23 1517  BP: 126/77  Pulse: 81  Temp: 97.9 F (36.6 C)  TempSrc: Oral  SpO2: 100%  Weight: 135 lb (61.2 kg)  Height: 5\' 3"  (1.6 m)   Exam General appearance : Awake, alert, not in any distress. Speech Clear. Not toxic looking HEENT:  Atraumatic and Normocephalic Neck: Supple, no JVD. No cervical lymphadenopathy.  Chest: Good air entry bilaterally, CTAB.  No rales/rhonchi/wheezing CVS: S1 S2 regular, no murmurs.  No TTP along R chest wall or R ribs.   Extremities: B/L Lower Ext shows no edema, both legs are warm to touch Neurology: Awake alert, and oriented X 3, CN II-XII intact, Non focal Skin: No Rash  Data Review Lab Results  Component Value Date   HGBA1C 4.8 11/15/2019    Assessment & Plan   1. Muscle spasm - cyclobenzaprine (FLEXERIL) 5 MG tablet; Take 1 tablet (5 mg total) by mouth at bedtime. Prn muscle spasms  Dispense: 30 tablet; Refill: 1 - lidocaine 4 %; Place 1 patch onto the skin daily. Prn pain  Dispense: 30 patch; Refill: 2  2. Hypertension, unspecified type (Primary) Continue amlodipine.  Call for RF    Return in about 4 months (around 04/15/2024) for PCP for chronic conditions-please assign to one of our PCP.  The patient was given clear instructions to go to ER or return to medical center if symptoms don't improve, worsen or new problems develop. The patient verbalized understanding. The patient was told to call to get lab results if they haven't heard anything  in the next week.      Georgian Co, PA-C [facility] and Wellness [place], [place] [phone]   12/15/2023, 3:42 PM

## 2023-12-21 DIAGNOSIS — Z3046 Encounter for surveillance of implantable subdermal contraceptive: Secondary | ICD-10-CM | POA: Diagnosis not present

## 2023-12-25 DIAGNOSIS — Z419 Encounter for procedure for purposes other than remedying health state, unspecified: Secondary | ICD-10-CM | POA: Diagnosis not present

## 2024-01-03 ENCOUNTER — Ambulatory Visit
Admission: RE | Admit: 2024-01-03 | Discharge: 2024-01-03 | Disposition: A | Discharge status: Home / Self Care | Source: Ambulatory Visit | Attending: Nurse Practitioner | Admitting: Nurse Practitioner

## 2024-01-03 DIAGNOSIS — Z1231 Encounter for screening mammogram for malignant neoplasm of breast: Secondary | ICD-10-CM

## 2024-01-06 ENCOUNTER — Encounter: Payer: Self-pay | Admitting: Nurse Practitioner

## 2024-01-24 DIAGNOSIS — Z419 Encounter for procedure for purposes other than remedying health state, unspecified: Secondary | ICD-10-CM | POA: Diagnosis not present

## 2024-01-26 ENCOUNTER — Ambulatory Visit: Payer: Medicaid Other | Attending: Family Medicine | Admitting: Family Medicine

## 2024-01-26 ENCOUNTER — Encounter: Payer: Self-pay | Admitting: Family Medicine

## 2024-01-26 VITALS — BP 129/82 | HR 70 | Ht 63.0 in | Wt 139.6 lb

## 2024-01-26 DIAGNOSIS — M25562 Pain in left knee: Secondary | ICD-10-CM

## 2024-01-26 DIAGNOSIS — I10 Essential (primary) hypertension: Secondary | ICD-10-CM | POA: Diagnosis not present

## 2024-01-26 DIAGNOSIS — G8929 Other chronic pain: Secondary | ICD-10-CM

## 2024-01-26 DIAGNOSIS — Z131 Encounter for screening for diabetes mellitus: Secondary | ICD-10-CM | POA: Diagnosis not present

## 2024-01-26 DIAGNOSIS — R87613 High grade squamous intraepithelial lesion on cytologic smear of cervix (HGSIL): Secondary | ICD-10-CM | POA: Diagnosis not present

## 2024-01-26 MED ORDER — AMLODIPINE BESYLATE 10 MG PO TABS: 10.0000 mg | ORAL_TABLET | Freq: Every day | ORAL | 1 refills | Status: AC

## 2024-01-26 MED ORDER — DICLOFENAC SODIUM 1 % EX GEL: 4.0000 g | Freq: Four times a day (QID) | CUTANEOUS | 1 refills | Status: DC

## 2024-01-26 NOTE — Patient Instructions (Signed)
 VISIT SUMMARY:  You came in today for a follow-up on your hypertension and to get routine blood work done. We discussed your current medications, recent mammogram results, and knee pain. We also reviewed your history of an abnormal Pap smear and the need for follow-up with your OBGYN.  YOUR PLAN:  -LEFT KNEE PAIN, POSSIBLE EARLY ARTHRITIS: Your left knee pain, which you've had for about a year, is likely due to early arthritis. I have prescribed Voltaren gel for you to apply to your left knee and advised you to use a knee brace while driving.  -HYPERTENSION: Your high blood pressure is well-controlled with your current medication, amlodipine  10 mg daily. I have refilled your prescription and scheduled a follow-up appointment in 6 months.  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 It is important to follow up with your OBGYN, so I have placed a referral for you.  -GENERAL HEALTH MAINTENANCE: You are due for routine blood tests and a Pap smear. Your recent mammogram showed dense breast tissue but no signs of cancer. I have ordered fasting blood tests for cholesterol and diabetes, and advised you to schedule these tests on a fasting day, preferably next Tuesday at 8:30 AM. I have also scheduled your Pap smear in 6 weeks.  INSTRUCTIONS:  Please follow up with your OBGYN as soon as possible. Schedule your fasting blood tests for next Tuesday at 8:30 AM. Your next follow-up appointment for hypertension is in 6 months. Your Pap smear is scheduled in 6 weeks.

## 2024-01-26 NOTE — Progress Notes (Signed)
 Subjective:  Patient ID: Holly Oconnor, female    DOB: May 03, 1981  Age: 43 y.o. MRN: [id number]  CC: Medical Management of Chronic Issues (No concerns)     Discussed the use of AI scribe software for clinical note transcription with the patient, who gave verbal consent to proceed.  History of Present Illness Holly Oconnor is a 43 year old female with hypertension who presents for follow-up and routine blood work.  Hypertension is managed with amlodipine  10 mg daily, effectively controlling her blood pressure.  She brings this in half and takes one half in the morning and one half in the evening as taking a whole pill causes headaches for her.  She is overdue for routine blood tests, including cholesterol and diabetes screening, as her last blood work was in November of last year and her last cholesterol check was in 2021.  She underwent a recent mammogram, which was negative for malignancy but this showed dense breast tissue, causing concern for her. She has a history of an abnormal Pap smear in March 2024, showing high-grade squamous intraepithelial lesion, and underwent a LEEP procedure but has not followed up with her OBGYN.  She experiences left knee pain for about a year, particularly when driving, with no right knee pain. No current chest pain or ankle swelling is present.    Past Medical History:  Diagnosis Date   AS (sickle cell trait) (HCC) 12/22/2012   Hypertension     No past surgical history on file.  Family History  Problem Relation Age of Onset   Diabetes Mother    Diabetes Father     Social History   Socioeconomic History   Marital status: Married    Spouse name: Not on file   Number of children: Not on file   Years of education: Not on file   Highest education level: Associate degree: occupational, Scientist, product/process development, or vocational program  Occupational History   Not on file  Tobacco Use   Smoking status: Never   Smokeless tobacco: Never  Vaping Use   Vaping  status: Never Used  Substance and Sexual Activity   Alcohol  use: No   Drug use: No   Sexual activity: Yes    Birth control/protection: Implant  Other Topics Concern   Not on file  Social History Narrative   Not on file   Social Drivers of Health   Financial Resource Strain: Low Risk  (12/15/2023)   Overall Financial Resource Strain (CARDIA)    Difficulty of Paying Living Expenses: Not hard at all  Food Insecurity: No Food Insecurity (12/15/2023)   Hunger Vital Sign    Worried About Running Out of Food in the Last Year: Never true    Ran Out of Food in the Last Year: Never true  Transportation Needs: No Transportation Needs (12/15/2023)   PRAPARE - Administrator, Civil Service (Medical): No    Lack of Transportation (Non-Medical): No  Physical Activity: Inactive (12/15/2023)   Exercise Vital Sign    Days of Exercise per Week: 0 days    Minutes of Exercise per Session: 0 min  Stress: Stress Concern Present (12/15/2023)   [employer] of Occupational Health - Occupational Stress Questionnaire    Feeling of Stress : To some extent  Social Connections: Socially Integrated (12/15/2023)   Social Connection and Isolation Panel [NHANES]    Frequency of Communication with Friends and Family: More than three times a week    Frequency of Social Gatherings with  Friends and Family: More than three times a week    Attends Religious Services: More than 4 times per year    Active Member of Clubs or Organizations: Yes    Attends Engineer, structural: More than 4 times per year    Marital Status: Married    No Known Allergies  Outpatient Medications Prior to Visit  Medication Sig Dispense Refill   amLODipine  (NORVASC ) 10 MG tablet Take 0.5 tablets (5 mg total) by mouth 2 (two) times daily. 90 tablet 2   cyclobenzaprine  (FLEXERIL ) 5 MG tablet Take 1 tablet (5 mg total) by mouth at bedtime. Prn muscle spasms 30 tablet 1   Blood Pressure Monitoring (BLOOD PRESSURE KIT) DEVI Use  to measure blood pressure 1 each 0   lidocaine  4 % Place 1 patch onto the skin daily. Prn pain (Patient not taking: Reported on 01/26/2024) 30 patch 2   No facility-administered medications prior to visit.     ROS Review of Systems  Constitutional:  Negative for activity change and appetite change.  HENT:  Negative for sinus pressure and sore throat.   Respiratory:  Negative for chest tightness, shortness of breath and wheezing.   Cardiovascular:  Negative for chest pain and palpitations.  Gastrointestinal:  Negative for abdominal distention, abdominal pain and constipation.  Genitourinary: Negative.   Musculoskeletal: Negative.   Psychiatric/Behavioral:  Negative for behavioral problems and dysphoric mood.     Objective:  BP 129/82   Pulse 70   Ht 5\' 3"  (1.6 m)   Wt 139 lb 9.6 oz (63.3 kg)   SpO2 99%   BMI 24.73 kg/m      01/26/2024    4:12 PM 12/15/2023    3:17 PM 07/28/2023    4:06 PM  BP/Weight  Systolic BP 129 126 140  Diastolic BP 82 77 90  Wt. (Lbs) 139.6 135   BMI 24.73 kg/m2 23.91 kg/m2       Physical Exam Constitutional:      Appearance: She is well-developed.  Cardiovascular:     Rate and Rhythm: Normal rate.     Heart sounds: Normal heart sounds. No murmur heard. Pulmonary:     Effort: Pulmonary effort is normal.     Breath sounds: Normal breath sounds. No wheezing or rales.  Chest:     Chest wall: No tenderness.  Abdominal:     General: Bowel sounds are normal. There is no distension.     Palpations: Abdomen is soft. There is no mass.     Tenderness: There is no abdominal tenderness.  Musculoskeletal:        General: Normal range of motion.     Right lower leg: No edema.     Left lower leg: No edema.  Neurological:     Mental Status: She is alert and oriented to person, place, and time.  Psychiatric:        Mood and Affect: Mood normal.        Latest Ref Rng & Units 07/28/2023    4:13 PM 09/21/2022    4:23 PM 01/16/2021   11:56 AM  CMP   Glucose 70 - 99 mg/dL 098  77  88   BUN 6 - 24 mg/dL 9  13  9    Creatinine 0.57 - 1.00 mg/dL 1.19  1.47  8.29   Sodium 134 - 144 mmol/L 137  139  139   Potassium 3.5 - 5.2 mmol/L 4.1  4.2  3.9   Chloride 96 - 106  mmol/L 101  105  101   CO2 20 - 29 mmol/L 23  23  23    Calcium 8.7 - 10.2 mg/dL 9.3  9.3  9.4   Total Protein 6.0 - 8.5 g/dL  7.2  7.1   Total Bilirubin 0.0 - 1.2 mg/dL  0.6  1.3   Alkaline Phos 44 - 121 IU/L  57  50   AST 0 - 40 IU/L  19  14   ALT 0 - 32 IU/L  11  10     Lipid Panel     Component Value Date/Time   CHOL 157 11/15/2019 1030   TRIG 88 11/15/2019 1030   HDL 49 11/15/2019 1030   CHOLHDL 3.2 11/15/2019 1030   LDLCALC 91 11/15/2019 1030    CBC    Component Value Date/Time   WBC 3.9 01/16/2021 1156   WBC 10.0 06/24/2016 0545   RBC 4.25 01/16/2021 1156   RBC 4.39 06/24/2016 0545   HGB 12.9 01/16/2021 1156   HCT 38.6 01/16/2021 1156   PLT 156 01/16/2021 1156   MCV 91 01/16/2021 1156   MCH 30.4 01/16/2021 1156   MCH 28.9 06/24/2016 0545   MCHC 33.4 01/16/2021 1156   MCHC 34.4 06/24/2016 0545   RDW 12.7 01/16/2021 1156   LYMPHSABS 1.5 01/16/2021 1156   MONOABS 0.6 11/20/2015 1416   EOSABS 0.0 01/16/2021 1156   BASOSABS 0.0 01/16/2021 1156    Lab Results  Component Value Date   HGBA1C 4.8 11/15/2019       Assessment & Plan Left knee pain, possible early arthritis Left knee pain for nearly a year, likely early arthritis. - Prescribed Voltaren gel for topical application to left knee. - Advised knee brace use while driving.  Hypertension Hypertension well-controlled with amlodipine  10 mg daily. - Refilled amlodipine  10 mg daily (she takes half a pill in the morning and half a pill in the evening to prevent headaches) -Counseled on blood pressure goal of less than 130/80, low-sodium, DASH diet, medication compliance, 150 minutes of moderate intensity exercise per week. Discussed medication compliance, adverse effects. - Scheduled follow-up  in 6 months.  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 Follow-up with OBGYN needed. - Placed referral to OBGYN for follow-up.  General Health Maintenance Due for routine blood tests and Pap smear. Mammogram shows dense breast tissue, no malignancy - Ordered fasting blood tests for cholesterol and diabetes. - Advised scheduling blood tests on a fasting day, preferably next Tuesday at 8:30 AM. - Reassured about mammogram results, advised regular screenings.         No orders of the defined types were placed in this encounter.   Follow-up: No follow-ups on file.       Joaquin Mulberry, MD, FAAFP. [facility] and Wellness [place], [place] [phone]   01/26/2024, 4:40 PM

## 2024-02-03 ENCOUNTER — Ambulatory Visit: Attending: Critical Care Medicine

## 2024-02-03 ENCOUNTER — Other Ambulatory Visit (INDEPENDENT_AMBULATORY_CARE_PROVIDER_SITE_OTHER)

## 2024-02-03 DIAGNOSIS — I10 Essential (primary) hypertension: Secondary | ICD-10-CM | POA: Diagnosis not present

## 2024-02-03 DIAGNOSIS — Z131 Encounter for screening for diabetes mellitus: Secondary | ICD-10-CM

## 2024-02-04 ENCOUNTER — Ambulatory Visit: Payer: Self-pay | Admitting: Family Medicine

## 2024-02-04 LAB — CMP14+EGFR
ALT: 16 IU/L (ref 0–32)
AST: 17 IU/L (ref 0–40)
Albumin: 4.6 g/dL (ref 3.9–4.9)
Alkaline Phosphatase: 62 IU/L (ref 44–121)
BUN/Creatinine Ratio: 14 (ref 9–23)
BUN: 8 mg/dL (ref 6–24)
Bilirubin Total: 0.8 mg/dL (ref 0.0–1.2)
CO2: 19 mmol/L — ABNORMAL LOW (ref 20–29)
Calcium: 9.3 mg/dL (ref 8.7–10.2)
Chloride: 105 mmol/L (ref 96–106)
Creatinine, Ser: 0.57 mg/dL (ref 0.57–1.00)
Globulin, Total: 2.7 g/dL (ref 1.5–4.5)
Glucose: 92 mg/dL (ref 70–99)
Potassium: 3.7 mmol/L (ref 3.5–5.2)
Sodium: 140 mmol/L (ref 134–144)
Total Protein: 7.3 g/dL (ref 6.0–8.5)
eGFR: 117 mL/min/{1.73_m2} (ref >59)

## 2024-02-04 LAB — HEMOGLOBIN A1C
Est. average glucose Bld gHb Est-mCnc: 97 mg/dL
Hgb A1c MFr Bld: 5 % (ref 4.8–5.6)

## 2024-02-04 LAB — LP+NON-HDL CHOLESTEROL
Cholesterol, Total: 168 mg/dL (ref 100–199)
HDL: 57 mg/dL (ref >39)
LDL Chol Calc (NIH): 98 mg/dL (ref 0–99)
Total Non-HDL-Chol (LDL+VLDL): 111 mg/dL (ref 0–129)
Triglycerides: 70 mg/dL (ref 0–149)
VLDL Cholesterol Cal: 13 mg/dL (ref 5–40)

## 2024-02-22 DIAGNOSIS — Z01419 Encounter for gynecological examination (general) (routine) without abnormal findings: Secondary | ICD-10-CM | POA: Diagnosis not present

## 2024-02-22 DIAGNOSIS — Z3046 Encounter for surveillance of implantable subdermal contraceptive: Secondary | ICD-10-CM | POA: Diagnosis not present

## 2024-02-24 DIAGNOSIS — Z419 Encounter for procedure for purposes other than remedying health state, unspecified: Secondary | ICD-10-CM | POA: Diagnosis not present

## 2024-02-24 NOTE — Telephone Encounter (Signed)
 Copied from CRM [id number]. 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 Patient is wanting a callback at [phone]. Patient states was last seen with Dr. Enobong Newlin

## 2024-02-25 NOTE — Telephone Encounter (Signed)
 Copied from CRM [id number]. 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 Patient is wanting a callback at [phone]. Patient states was last seen with Dr. Joaquin Mulberry  >> Feb 24, 2024  5:27 PM DeAngela L wrote: Patient calling to ask if the office can receive the information she signed mychart for school physical exam paper from the school  Pt number [phone]

## 2024-02-29 DIAGNOSIS — Z0184 Encounter for antibody response examination: Secondary | ICD-10-CM | POA: Diagnosis not present

## 2024-03-02 DIAGNOSIS — Z0184 Encounter for antibody response examination: Secondary | ICD-10-CM | POA: Diagnosis not present

## 2024-03-03 ENCOUNTER — Encounter (INDEPENDENT_AMBULATORY_CARE_PROVIDER_SITE_OTHER): Payer: Self-pay | Admitting: Primary Care

## 2024-03-03 ENCOUNTER — Ambulatory Visit (INDEPENDENT_AMBULATORY_CARE_PROVIDER_SITE_OTHER): Admitting: Primary Care

## 2024-03-03 VITALS — BP 135/80 | HR 74 | Temp 97.5°F | Resp 16 | Ht 63.0 in | Wt 135.2 lb

## 2024-03-03 DIAGNOSIS — Z0000 Encounter for general adult medical examination without abnormal findings: Secondary | ICD-10-CM

## 2024-03-03 DIAGNOSIS — M25562 Pain in left knee: Secondary | ICD-10-CM

## 2024-03-03 DIAGNOSIS — G8929 Other chronic pain: Secondary | ICD-10-CM | POA: Diagnosis not present

## 2024-03-11 MED ORDER — IBUPROFEN 600 MG PO TABS: 600.0000 mg | ORAL_TABLET | Freq: Three times a day (TID) | ORAL | 0 refills | Status: DC | PRN

## 2024-03-11 NOTE — Progress Notes (Signed)
 [facility]  Holly Oconnor is a 43 y.o. female presents to office today for annual physical exam examination.    Concerns today include: 1. Refill on ibuprofen   Occupation: Aro, Marital status: m, Substance use: no Diet: none, Exercise: yes  Health Maintenance  Topic Date Due   Hepatitis B Vaccines (1 of 3 - 19+ 3-dose series) Never done   COVID-19 Vaccine (4 - 2024-25 season) 05/16/2023   Cervical Cancer Screening (Pap smear)  11/19/2023   INFLUENZA VACCINE  04/14/2024   DTaP/Tdap/Td (3 - Td or Tdap) 03/04/2026   HPV VACCINES  Completed   Hepatitis C Screening  Completed   HIV Screening  Completed   Pneumococcal Vaccine 84-2 Years old  Aged Out   Meningococcal B Vaccine  Aged Out     Past Medical History:  Diagnosis Date   AS (sickle cell trait) (HCC) 12/22/2012   Hypertension    Social History   Socioeconomic History   Marital status: Married    Spouse name: Not on file   Number of children: Not on file   Years of education: Not on file   Highest education level: Associate degree: occupational, Scientist, product/process development, or vocational program  Occupational History   Not on file  Tobacco Use   Smoking status: Never   Smokeless tobacco: Never  Vaping Use   Vaping status: Never Used  Substance and Sexual Activity   Alcohol  use: No   Drug use: No   Sexual activity: Yes    Birth control/protection: Implant  Other Topics Concern   Not on file  Social History Narrative   Not on file   Social Drivers of Health   Financial Resource Strain: Low Risk  (03/02/2024)   Overall Financial Resource Strain (CARDIA)    Difficulty of Paying Living Expenses: Not hard at all  Food Insecurity: No Food Insecurity (03/02/2024)   Hunger Vital Sign    Worried About Running Out of Food in the Last Year: Never true    Ran Out of Food in the Last Year: Never true  Transportation Needs: No Transportation Needs (03/02/2024)   PRAPARE - Administrator, Civil Service  (Medical): No    Lack of Transportation (Non-Medical): No  Physical Activity: Inactive (03/02/2024)   Exercise Vital Sign    Days of Exercise per Week: 0 days    Minutes of Exercise per Session: Not on file  Stress: No Stress Concern Present (03/02/2024)   [employer] of Occupational Health - Occupational Stress Questionnaire    Feeling of Stress: Only a little  Recent Concern: Stress - Stress Concern Present (12/15/2023)   [employer] of Occupational Health - Occupational Stress Questionnaire    Feeling of Stress : To some extent  Social Connections: Moderately Integrated (03/02/2024)   Social Connection and Isolation Panel    Frequency of Communication with Friends and Family: Twice a week    Frequency of Social Gatherings with Friends and Family: Once a week    Attends Religious Services: More than 4 times per year    Active Member of [employer] or Organizations: No    Attends Banker Meetings: Not on file    Marital Status: Married  Catering manager Violence: Not At Risk (12/15/2023)   Humiliation, Afraid, Rape, and Kick questionnaire    Fear of Current or Ex-Partner: No    Emotionally Abused: No    Physically Abused: No    Sexually Abused: No   No past surgical history on  file. Family History  Problem Relation Age of Onset   Diabetes Mother    Diabetes Father     Current Outpatient Medications:    amLODipine  (NORVASC ) 10 MG tablet, Take 1 tablet (10 mg total) by mouth daily. Ok to take half twice daily due to headaches with whole tablet., Disp: 90 tablet, Rfl: 1   Blood Pressure Monitoring (BLOOD PRESSURE KIT) DEVI, Use to measure blood pressure, Disp: 1 each, Rfl: 0   cyclobenzaprine  (FLEXERIL ) 5 MG tablet, Take 1 tablet (5 mg total) by mouth at bedtime. Prn muscle spasms, Disp: 30 tablet, Rfl: 1   diclofenac  Sodium (VOLTAREN ) 1 % GEL, Apply 4 g topically 4 (four) times daily., Disp: 100 g, Rfl: 1   lidocaine  4 %, Place 1 patch onto the skin daily. Prn pain,  Disp: 30 patch, Rfl: 2 Outpatient Encounter Medications as of 03/03/2024  Medication Sig   amLODipine  (NORVASC ) 10 MG tablet Take 1 tablet (10 mg total) by mouth daily. Ok to take half twice daily due to headaches with whole tablet.   Blood Pressure Monitoring (BLOOD PRESSURE KIT) DEVI Use to measure blood pressure   cyclobenzaprine  (FLEXERIL ) 5 MG tablet Take 1 tablet (5 mg total) by mouth at bedtime. Prn muscle spasms   diclofenac  Sodium (VOLTAREN ) 1 % GEL Apply 4 g topically 4 (four) times daily.   lidocaine  4 % Place 1 patch onto the skin daily. Prn pain   No facility-administered encounter medications on file as of 03/03/2024.    No Known Allergies   ROS: Review of Systems A comprehensive review of systems was negative.    Physical exam General: No apparent distress. Eyes: Extraocular eye movements intact, pupils equal and round. Neck: Supple, trachea midline. Thyroid: No enlargement, mobile without fixation, no tenderness. Cardiovascular: Regular rhythm and rate, no murmur, normal radial pulses. Respiratory: Normal respiratory effort, clear to auscultation. Gastrointestinal: Normal pitch active bowel sounds, nontender abdomen without distention or appreciable hepatomegaly. Neurologic: Cranial nerves normal as tested, deep tendon reflexes +, no tremor,  Musculoskeletal: Normal muscle tone, no tenderness on palpation of tibia, no excessive thoracic kyphosis. Skin: Appropriate warmth, no visible rash. Mental status: Alert, conversant, speech clear, thought logical, appropriate mood and affect, no hallucinations or delusions evident. Hematologic/lymphatic: No cervical adenopathy, no visible ecchymoses.    Assessment/ Plan: Holly Oconnor here for annual physical exam.  Holly Oconnor was seen today for annual exam.  Diagnoses and all orders for this visit:  Annual physical exam  Chronic pain of left knee -     ibuprofen  (ADVIL ) 600 MG tablet; Take 1 tablet (600 mg total) by mouth  every 8 (eight) hours as needed.   Counseled on healthy lifestyle choices, including diet (rich in fruits, vegetables and lean meats and low in salt and simple carbohydrates) and exercise (at least 30 minutes of moderate physical activity daily).  Patient to follow up in 1 year for annual exam or sooner if needed.  The above assessment and management plan was discussed with the patient. The patient verbalized understanding of and has agreed to the management plan. Patient is aware to call the clinic if symptoms persist or worsen. Patient is aware when to return to the clinic for a follow-up visit. Patient educated on when it is appropriate to go to the emergency department.   This note has been created with Education officer, environmental. Any transcriptional errors are unintentional.   Holly SHAUNNA Bohr, NP 03/11/2024, 1:28 PM

## 2024-03-14 DIAGNOSIS — Z23 Encounter for immunization: Secondary | ICD-10-CM | POA: Diagnosis not present

## 2024-03-25 DIAGNOSIS — Z419 Encounter for procedure for purposes other than remedying health state, unspecified: Secondary | ICD-10-CM | POA: Diagnosis not present

## 2024-04-14 ENCOUNTER — Telehealth: Payer: Self-pay | Admitting: Critical Care Medicine

## 2024-04-14 DIAGNOSIS — Z23 Encounter for immunization: Secondary | ICD-10-CM | POA: Diagnosis not present

## 2024-04-14 NOTE — Telephone Encounter (Signed)
 Called patient to confirm upcoming appointment 04/17/2024 at 3:10 pm. Patient appointment has been successfully confirmed

## 2024-04-17 ENCOUNTER — Ambulatory Visit: Attending: Nurse Practitioner | Admitting: Nurse Practitioner

## 2024-04-17 ENCOUNTER — Encounter: Payer: Self-pay | Admitting: Nurse Practitioner

## 2024-04-17 VITALS — BP 127/80 | HR 75 | Resp 19 | Ht 63.0 in | Wt 140.4 lb

## 2024-04-17 DIAGNOSIS — M7918 Myalgia, other site: Secondary | ICD-10-CM

## 2024-04-17 DIAGNOSIS — I10 Essential (primary) hypertension: Secondary | ICD-10-CM

## 2024-04-17 NOTE — Patient Instructions (Signed)
 [facility] [address]  [phone]

## 2024-04-17 NOTE — Progress Notes (Unsigned)
   Assessment & Plan:  Holly Oconnor was seen today for hypertension.  Diagnoses and all orders for this visit:  Musculoskeletal pain -     DG Chest 2 View; Future  Essential hypertension    Patient has been counseled on age-appropriate routine health concerns for screening and prevention. These are reviewed and up-to-date. Referrals have been placed accordingly. Immunizations are up-to-date or declined.    Subjective:   Chief Complaint  Patient presents with   Hypertension    Holly Oconnor 43 y.o. female presents to office today   BP Readings from Last 3 Encounters:  04/17/24 127/80  03/03/24 135/80  01/26/24 129/82     ROS  Past Medical History:  Diagnosis Date   AS (sickle cell trait) (HCC) 12/22/2012   Hypertension     No past surgical history on file.  Family History  Problem Relation Age of Onset   Diabetes Mother    Diabetes Father     Social History Reviewed with no changes to be made today.   Outpatient Medications Prior to Visit  Medication Sig Dispense Refill   amLODipine  (NORVASC ) 10 MG tablet Take 1 tablet (10 mg total) by mouth daily. Ok to take half twice daily due to headaches with whole tablet. 90 tablet 1   Blood Pressure Monitoring (BLOOD PRESSURE KIT) DEVI Use to measure blood pressure (Patient not taking: Reported on 04/17/2024) 1 each 0   cyclobenzaprine  (FLEXERIL ) 5 MG tablet Take 1 tablet (5 mg total) by mouth at bedtime. Prn muscle spasms (Patient not taking: Reported on 04/17/2024) 30 tablet 1   diclofenac  Sodium (VOLTAREN ) 1 % GEL Apply 4 g topically 4 (four) times daily. (Patient not taking: Reported on 04/17/2024) 100 g 1   ibuprofen  (ADVIL ) 600 MG tablet Take 1 tablet (600 mg total) by mouth every 8 (eight) hours as needed. (Patient not taking: Reported on 04/17/2024) 90 tablet 0   lidocaine  4 % Place 1 patch onto the skin daily. Prn pain (Patient not taking: Reported on 04/17/2024) 30 patch 2   No facility-administered medications prior to visit.     No Known Allergies     Objective:    BP 127/80 (BP Location: Left Arm, Patient Position: Sitting, Cuff Size: Normal)   Pulse 75   Resp 19   Ht 5' 3 (1.6 m)   Wt 140 lb 6.4 oz (63.7 kg)   LMP  (LMP Unknown)   SpO2 100%   BMI 24.87 kg/m  Wt Readings from Last 3 Encounters:  04/17/24 140 lb 6.4 oz (63.7 kg)  03/03/24 135 lb 3.2 oz (61.3 kg)  01/26/24 139 lb 9.6 oz (63.3 kg)    Physical Exam       Patient has been counseled extensively about nutrition and exercise as well as the importance of adherence with medications and regular follow-up. The patient was given clear instructions to go to ER or return to medical center if symptoms don't improve, worsen or new problems develop. The patient verbalized understanding.   Follow-up: No follow-ups on file.   Haze LELON Servant, FNP-BC [facility] [place], [place] [phone]   04/17/2024, 3:50 PM

## 2024-04-25 DIAGNOSIS — Z419 Encounter for procedure for purposes other than remedying health state, unspecified: Secondary | ICD-10-CM | POA: Diagnosis not present

## 2024-05-03 ENCOUNTER — Encounter: Admitting: Obstetrics and Gynecology

## 2024-05-24 NOTE — Progress Notes (Deleted)
 Pt cancelled

## 2024-05-26 ENCOUNTER — Encounter: Admitting: Obstetrics and Gynecology

## 2024-05-26 DIAGNOSIS — Z419 Encounter for procedure for purposes other than remedying health state, unspecified: Secondary | ICD-10-CM | POA: Diagnosis not present

## 2024-06-22 NOTE — Progress Notes (Incomplete)
   43 y.o. G69P3003 female here for ***. Married.  No LMP recorded. Patient has had an implant.    She reports ***. Urine sample provided: ***  Birth control: *** Last mammogram: 01/03/24 density d, birads 1 neg Sexually active: ***    GYN HISTORY: ***  OB History  Gravida Para Term Preterm AB Living  3 3 3  0 0 3  SAB IAB Ectopic Multiple Live Births  0 0 0 0 3    # Outcome Date GA Lbr Len/2nd Weight Sex Type Anes PTL Lv  3 Term 05/22/16 [redacted]w[redacted]d / 00:06 8 lb 0.2 oz (3.635 kg) M Vag-Spont None  LIV  2 Term 06/04/13 [redacted]w[redacted]d 08:00 / 00:11 7 lb 2.8 oz (3.255 kg) M Vag-Spont None  LIV  1 Term 10/07/10 [redacted]w[redacted]d  7 lb (3.175 kg) F Vag-Spont EPI  LIV   Past Medical History:  Diagnosis Date   AS (sickle cell trait) 12/22/2012   Hypertension    No past surgical history on file. Current Outpatient Medications on File Prior to Visit  Medication Sig Dispense Refill   amLODipine  (NORVASC ) 10 MG tablet Take 1 tablet (10 mg total) by mouth daily. Ok to take half twice daily due to headaches with whole tablet. 90 tablet 1   Blood Pressure Monitoring (BLOOD PRESSURE KIT) DEVI Use to measure blood pressure (Patient not taking: Reported on 04/17/2024) 1 each 0   cyclobenzaprine  (FLEXERIL ) 5 MG tablet Take 1 tablet (5 mg total) by mouth at bedtime. Prn muscle spasms (Patient not taking: Reported on 04/17/2024) 30 tablet 1   No current facility-administered medications on file prior to visit.   No Known Allergies    PE There were no vitals filed for this visit. There is no height or weight on file to calculate BMI.  Physical Exam    Assessment and Plan:        There are no diagnoses linked to this encounter.  Clotilda FORBES Pa, CMA

## 2024-06-26 ENCOUNTER — Ambulatory Visit: Admitting: Obstetrics and Gynecology

## 2024-07-26 DIAGNOSIS — Z419 Encounter for procedure for purposes other than remedying health state, unspecified: Secondary | ICD-10-CM | POA: Diagnosis not present

## 2024-07-27 NOTE — Progress Notes (Incomplete)
   43 y.o. 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 Married.  No LMP recorded. Patient has had an implant.    She reports ***. Urine sample provided: ***  Birth control: *** Last mammogram: 01/03/24 Birads 1, Density D Sexually active: ***    GYN HISTORY: ***  OB History  Gravida Para Term Preterm AB Living  3 3 3  0 0 3  SAB IAB Ectopic Multiple Live Births  0 0 0 0 3    # Outcome Date GA Lbr Len/2nd Weight Sex Type Anes PTL Lv  3 Term 05/22/16 [redacted]w[redacted]d / 00:06 8 lb 0.2 oz (3.635 kg) M Vag-Spont None  LIV  2 Term 06/04/13 [redacted]w[redacted]d 08:00 / 00:11 7 lb 2.8 oz (3.255 kg) M Vag-Spont None  LIV  1 Term 10/07/10 [redacted]w[redacted]d  7 lb (3.175 kg) F Vag-Spont EPI  LIV   Past Medical History:  Diagnosis Date   AS (sickle cell trait) 12/22/2012   Hypertension    No past surgical history on file. Current Outpatient Medications on File Prior to Visit  Medication Sig Dispense Refill   amLODipine  (NORVASC ) 10 MG tablet Take 1 tablet (10 mg total) by mouth daily. Ok to take half twice daily due to headaches with whole tablet. 90 tablet 1   Blood Pressure Monitoring (BLOOD PRESSURE KIT) DEVI Use to measure blood pressure (Patient not taking: Reported on 04/17/2024) 1 each 0   cyclobenzaprine  (FLEXERIL ) 5 MG tablet Take 1 tablet (5 mg total) by mouth at bedtime. Prn muscle spasms (Patient not taking: Reported on 04/17/2024) 30 tablet 1   No current facility-administered medications on file prior to visit.   No Known Allergies    PE There were no vitals filed for this visit. There is no height or weight on file to calculate BMI.  Physical Exam    Assessment and Plan:        There are no diagnoses linked to this encounter.  Clotilda FORBES Pa, CMA

## 2024-07-31 ENCOUNTER — Ambulatory Visit: Admitting: Obstetrics and Gynecology

## 2024-08-01 ENCOUNTER — Ambulatory Visit: Attending: Family Medicine | Admitting: Family Medicine

## 2024-08-01 ENCOUNTER — Encounter: Payer: Self-pay | Admitting: Family Medicine

## 2024-08-01 VITALS — BP 139/81 | HR 65 | Temp 98.1°F | Ht 63.0 in | Wt 135.2 lb

## 2024-08-01 DIAGNOSIS — I10 Essential (primary) hypertension: Secondary | ICD-10-CM | POA: Diagnosis not present

## 2024-08-01 DIAGNOSIS — M549 Dorsalgia, unspecified: Secondary | ICD-10-CM | POA: Diagnosis not present

## 2024-08-01 NOTE — Progress Notes (Signed)
 Subjective:  Patient ID: Holly Oconnor, female    DOB: 1981/08/19  Age: 43 y.o. MRN: [id number]  CC: Hypertension (Lower back pain)     Discussed the use of AI scribe software for clinical note transcription with the patient, who gave verbal consent to proceed.  History of Present Illness Holly Oconnor is a 43 year old female with hypertension who presents for a follow-up visit.  She experiences mild lower back pain across her lumbar spine for one month, localized without radiation, primarily when transitioning from sitting to standing. No heavy lifting or specific relieving factors are identified.  She denies presence of radiation of pain.  She has not used pain medications.  She was prescribed amlodipine  10 mg for hypertension but has not taken it for over two months. She is not on any current medications and monitors her blood pressure at home.  BP in the clinic is 139/81.    Past Medical History:  Diagnosis Date   AS (sickle cell trait) 12/22/2012   Hypertension     No past surgical history on file.  Family History  Problem Relation Age of Onset   Diabetes Mother    Diabetes Father     Social History   Socioeconomic History   Marital status: Married    Spouse name: Not on file   Number of children: Not on file   Years of education: Not on file   Highest education level: GED or equivalent  Occupational History   Not on file  Tobacco Use   Smoking status: Never   Smokeless tobacco: Never  Vaping Use   Vaping status: Never Used  Substance and Sexual Activity   Alcohol  use: No   Drug use: No   Sexual activity: Yes    Birth control/protection: Implant  Other Topics Concern   Not on file  Social History Narrative   Not on file   Social Drivers of Health   Financial Resource Strain: Low Risk  (07/31/2024)   Overall Financial Resource Strain (CARDIA)    Difficulty of Paying Living Expenses: Not hard at all  Food Insecurity: No Food Insecurity (07/31/2024)    Hunger Vital Sign    Worried About Running Out of Food in the Last Year: Never true    Ran Out of Food in the Last Year: Never true  Transportation Needs: No Transportation Needs (07/31/2024)   PRAPARE - Administrator, Civil Service (Medical): No    Lack of Transportation (Non-Medical): No  Physical Activity: Insufficiently Active (07/31/2024)   Exercise Vital Sign    Days of Exercise per Week: 1 day    Minutes of Exercise per Session: 20 min  Stress: No Stress Concern Present (07/31/2024)   [employer] of Occupational Health - Occupational Stress Questionnaire    Feeling of Stress: Only a little  Social Connections: Moderately Isolated (07/31/2024)   Social Connection and Isolation Panel    Frequency of Communication with Friends and Family: Once a week    Frequency of Social Gatherings with Friends and Family: Once a week    Attends Religious Services: More than 4 times per year    Active Member of [employer] or Organizations: No    Attends Engineer, Structural: Not on file    Marital Status: Married    No Known Allergies  Outpatient Medications Prior to Visit  Medication Sig Dispense Refill   amLODipine  (NORVASC ) 10 MG tablet Take 1 tablet (10 mg total) by mouth daily.  Ok to take half twice daily due to headaches with whole tablet. 90 tablet 1   cyclobenzaprine  (FLEXERIL ) 5 MG tablet Take 1 tablet (5 mg total) by mouth at bedtime. Prn muscle spasms 30 tablet 1   Blood Pressure Monitoring (BLOOD PRESSURE KIT) DEVI Use to measure blood pressure (Patient not taking: Reported on 08/01/2024) 1 each 0   No facility-administered medications prior to visit.     ROS Review of Systems  Constitutional:  Negative for activity change and appetite change.  HENT:  Negative for sinus pressure and sore throat.   Respiratory:  Negative for chest tightness, shortness of breath and wheezing.   Cardiovascular:  Negative for chest pain and palpitations.   Gastrointestinal:  Negative for abdominal distention, abdominal pain and constipation.  Genitourinary: Negative.   Musculoskeletal:  Positive for back pain.  Psychiatric/Behavioral:  Negative for behavioral problems and dysphoric mood.     Objective:  BP 139/81   Pulse 65   Temp 98.1 F (36.7 C) (Oral)   Ht 5' 3 (1.6 m)   Wt 135 lb 3.2 oz (61.3 kg)   SpO2 98%   BMI 23.95 kg/m      08/01/2024    4:10 PM 04/17/2024    3:32 PM 03/03/2024   11:03 AM  BP/Weight  Systolic BP 139 127 135  Diastolic BP 81 80 80  Wt. (Lbs) 135.2 140.4 135.2  BMI 23.95 kg/m2 24.87 kg/m2 23.95 kg/m2      Physical Exam Constitutional:      Appearance: She is well-developed.  Cardiovascular:     Rate and Rhythm: Normal rate.     Heart sounds: Normal heart sounds. No murmur heard. Pulmonary:     Effort: Pulmonary effort is normal.     Breath sounds: Normal breath sounds. No wheezing or rales.  Chest:     Chest wall: No tenderness.  Abdominal:     General: Bowel sounds are normal. There is no distension.     Palpations: Abdomen is soft. There is no mass.     Tenderness: There is no abdominal tenderness.  Musculoskeletal:        General: No tenderness (No TTP of lumbar spine). Normal range of motion.     Right lower leg: No edema.     Left lower leg: No edema.     Comments: Negative straight leg raise bilaterally  Neurological:     Mental Status: She is alert and oriented to person, place, and time.  Psychiatric:        Mood and Affect: Mood normal.        Latest Ref Rng & Units 02/03/2024    8:46 AM 07/28/2023    4:13 PM 09/21/2022    4:23 PM  CMP  Glucose 70 - 99 mg/dL 92  885  77   BUN 6 - 24 mg/dL 8  9  13    Creatinine 0.57 - 1.00 mg/dL 9.42  9.35  9.37   Sodium 134 - 144 mmol/L 140  137  139   Potassium 3.5 - 5.2 mmol/L 3.7  4.1  4.2   Chloride 96 - 106 mmol/L 105  101  105   CO2 20 - 29 mmol/L 19  23  23    Calcium 8.7 - 10.2 mg/dL 9.3  9.3  9.3   Total Protein 6.0 - 8.5 g/dL  7.3   7.2   Total Bilirubin 0.0 - 1.2 mg/dL 0.8   0.6   Alkaline Phos 44 - 121 IU/L 62  57   AST 0 - 40 IU/L 17   19   ALT 0 - 32 IU/L 16   11     Lipid Panel     Component Value Date/Time   CHOL 168 02/03/2024 0846   TRIG 70 02/03/2024 0846   HDL 57 02/03/2024 0846   CHOLHDL 3.2 11/15/2019 1030   LDLCALC 98 02/03/2024 0846    CBC    Component Value Date/Time   WBC 3.9 01/16/2021 1156   WBC 10.0 06/24/2016 0545   RBC 4.25 01/16/2021 1156   RBC 4.39 06/24/2016 0545   HGB 12.9 01/16/2021 1156   HCT 38.6 01/16/2021 1156   PLT 156 01/16/2021 1156   MCV 91 01/16/2021 1156   MCH 30.4 01/16/2021 1156   MCH 28.9 06/24/2016 0545   MCHC 33.4 01/16/2021 1156   MCHC 34.4 06/24/2016 0545   RDW 12.7 01/16/2021 1156   LYMPHSABS 1.5 01/16/2021 1156   MONOABS 0.6 11/20/2015 1416   EOSABS 0.0 01/16/2021 1156   BASOSABS 0.0 01/16/2021 1156    Lab Results  Component Value Date   HGBA1C 5.0 02/03/2024       Assessment & Plan Essential hypertension Blood pressure stable at 139/81 mmHg (even though goal of less than 130/80 will be preferred) without amlodipine  for over two months. Lifestyle modifications may suffice. - Held amlodipine . - Monitor blood pressure at home weekly. - Follow-up in three months to reassess blood pressure control. - Ordered basic metabolic panel to check kidney function. - Advised on diet and lifestyle modifications, including salt intake reduction and regular exercise.  Musculoskeletal low back pain Intermittent mild low back pain likely due to arthritis, exacerbated by prolonged sitting or standing. - Use heating pad for pain relief. - Consider lidoderm  patch or Voltaren  gel for topical pain relief. - Perform back exercises or yoga. - Monitor pain and use Aleve  or ibuprofen  if pain becomes severe.  General Health Maintenance Flu shot received in October. OBGYN appointment rescheduled for December 12th. - Ensure Pap smear is completed at the  upcoming OBGYN appointment -given history of HGSIL      No orders of the defined types were placed in this encounter.   Follow-up: Return in about 3 months (around 11/01/2024) for Medical conditions with PCP.       Corrina Sabin, MD, FAAFP. [facility] and [facility] [place], [place] [phone]   08/01/2024, 4:42 PM

## 2024-08-01 NOTE — Patient Instructions (Signed)
 VISIT SUMMARY:  Today, you came in for a follow-up visit to check on your hypertension and discuss your recent lower back pain. We reviewed your current health status and made some adjustments to your treatment plan.  YOUR PLAN:  -ESSENTIAL HYPERTENSION: Essential hypertension is high blood pressure without a known secondary cause. Your blood pressure is currently controlled at 139/81 mmHg without taking amlodipine  for over two months. We will hold off on restarting the medication for now. Please continue to monitor your blood pressure at home weekly. We will reassess your blood pressure control in three months. Additionally, we have ordered a basic metabolic panel to check your kidney function. Please continue with the advised diet and lifestyle modifications, including reducing salt intake and engaging in regular exercise.  -LOW BACK PAIN: Your intermittent mild lower back pain is likely due to arthritis and is exacerbated by prolonged sitting or standing. To manage the pain, you can use a heating pad and consider using a lidoderm  patch or Voltaren  gel for topical relief. Performing back exercises or yoga may also help. If the pain becomes severe, you can take Aleve  or ibuprofen .  -GENERAL HEALTH MAINTENANCE: You received your flu shot in October. Your OBGYN appointment has been rescheduled for December 12th. Please ensure that you complete your Pap smear at this upcoming appointment.  INSTRUCTIONS:  Please follow up in three months to reassess your blood pressure control. Continue to monitor your blood pressure at home weekly. We have ordered a basic metabolic panel to check your kidney function. Ensure that you complete your Pap smear at your upcoming OBGYN appointment on December 12th.

## 2024-08-02 ENCOUNTER — Ambulatory Visit: Payer: Self-pay | Admitting: Family Medicine

## 2024-08-02 LAB — BASIC METABOLIC PANEL WITH GFR
BUN/Creatinine Ratio: 27 — ABNORMAL HIGH (ref 9–23)
BUN: 16 mg/dL (ref 6–24)
CO2: 21 mmol/L (ref 20–29)
Calcium: 9.2 mg/dL (ref 8.7–10.2)
Chloride: 104 mmol/L (ref 96–106)
Creatinine, Ser: 0.59 mg/dL (ref 0.57–1.00)
Glucose: 99 mg/dL (ref 70–99)
Potassium: 4.1 mmol/L (ref 3.5–5.2)
Sodium: 140 mmol/L (ref 134–144)
eGFR: 115 mL/min/1.73 (ref >59)

## 2024-08-16 ENCOUNTER — Other Ambulatory Visit (HOSPITAL_COMMUNITY)
Admission: RE | Admit: 2024-08-16 | Discharge: 2024-08-16 | Disposition: A | Source: Ambulatory Visit | Attending: Obstetrics and Gynecology | Admitting: Obstetrics and Gynecology

## 2024-08-16 ENCOUNTER — Encounter: Payer: Self-pay | Admitting: Obstetrics and Gynecology

## 2024-08-16 ENCOUNTER — Ambulatory Visit (INDEPENDENT_AMBULATORY_CARE_PROVIDER_SITE_OTHER): Admitting: Obstetrics and Gynecology

## 2024-08-16 VITALS — BP 124/80 | HR 72 | Temp 97.8°F | Ht 62.5 in | Wt 133.0 lb

## 2024-08-16 DIAGNOSIS — Z01419 Encounter for gynecological examination (general) (routine) without abnormal findings: Secondary | ICD-10-CM | POA: Insufficient documentation

## 2024-08-16 DIAGNOSIS — Z1331 Encounter for screening for depression: Secondary | ICD-10-CM | POA: Diagnosis not present

## 2024-08-16 DIAGNOSIS — Z87410 Personal history of cervical dysplasia: Secondary | ICD-10-CM | POA: Diagnosis not present

## 2024-08-16 DIAGNOSIS — Z3046 Encounter for surveillance of implantable subdermal contraceptive: Secondary | ICD-10-CM

## 2024-08-16 DIAGNOSIS — Z124 Encounter for screening for malignant neoplasm of cervix: Secondary | ICD-10-CM

## 2024-08-16 NOTE — Progress Notes (Signed)
 43 y.o. 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 Works as LAWYER. In school for LPN. Married. PCP: Theotis Haze ORN, NP   Patient's last menstrual period was 07/24/2024 (approximate). Period Duration (Days): 7-10 Period Pattern: (!) Irregular Menstrual Flow: Moderate Menstrual Control: Maxi pad Dysmenorrhea: (!) Moderate  She reports no concerns today. Urine sample provided: No  Abnormal bleeding: none Pelvic discharge or pain: none Breast mass, nipple discharge or skin changes : none  Sexually active: Yes Birth control: Implant  Gardasil: complete H/o abnl PAP: yes, CIN 2 in 2024 s/p LEEP Last PAP:     Component Value Date/Time   DIAGPAP (A) 11/19/2022 1339    - High grade squamous intraepithelial lesion (HSIL)   DIAGPAP  11/13/2021 1350    - Negative for intraepithelial lesion or malignancy (NILM)   DIAGPAP  05/31/2018 0000    NEGATIVE FOR INTRAEPITHELIAL LESIONS OR MALIGNANCY.   HPVHIGH Positive (A) 11/19/2022 1339   HPVHIGH Positive (A) 11/13/2021 1350   ADEQPAP  11/19/2022 1339    Satisfactory for evaluation; transformation zone component PRESENT.   ADEQPAP  11/13/2021 1350    Satisfactory for evaluation; transformation zone component PRESENT.   ADEQPAP  05/31/2018 0000    Satisfactory for evaluation  endocervical/transformation zone component PRESENT.   Last mammogram: 01/03/24 Birads 1, Density D Last colonoscopy: N/A  Exercising: No Smoker: No  Flowsheet Row Office Visit from 08/16/2024 in [facility]  PHQ-2 Total Score 0    Flowsheet Row Office Visit from 08/01/2024 in [facility]  PHQ-9 Total Score 1     GYN HISTORY: 2024, CIN 2 s/p LEEP  OB History  Gravida Para Term Preterm AB Living  3 3 3  0 0 3  SAB IAB Ectopic Multiple Live Births  0 0 0 0 3    # Outcome Date GA Lbr Len/2nd  Weight Sex Type Anes PTL Lv  3 Term 05/22/16 [redacted]w[redacted]d / 00:06 8 lb 0.2 oz (3.635 kg) M Vag-Spont None  LIV  2 Term 06/04/13 [redacted]w[redacted]d 08:00 / 00:11 7 lb 2.8 oz (3.255 kg) M Vag-Spont None  LIV  1 Term 10/07/10 [redacted]w[redacted]d  7 lb (3.175 kg) F Vag-Spont EPI  LIV   Past Medical History:  Diagnosis Date   AS (sickle cell trait) 12/22/2012   Hypertension    History reviewed. No pertinent surgical history. Current Outpatient Medications on File Prior to Visit  Medication Sig Dispense Refill   amLODipine  (NORVASC ) 10 MG tablet Take 1 tablet (10 mg total) by mouth daily. Ok to take half twice daily due to headaches with whole tablet. 90 tablet 1   cyclobenzaprine  (FLEXERIL ) 5 MG tablet Take 1 tablet (5 mg total) by mouth at bedtime. Prn muscle spasms 30 tablet 1   etonogestrel  (NEXPLANON ) 68 MG IMPL implant 1 each by Subdermal route once.     Blood Pressure Monitoring (BLOOD PRESSURE KIT) DEVI Use to measure blood pressure (Patient not taking: Reported on 08/16/2024) 1 each 0   No current facility-administered medications on file prior to visit.   Social History   Socioeconomic History   Marital status: Married    Spouse name: Not on file   Number of children: Not on file   Years of education: Not on file   Highest education level: GED or equivalent  Occupational History   Not  on file  Tobacco Use   Smoking status: Never   Smokeless tobacco: Never  Vaping Use   Vaping status: Never Used  Substance and Sexual Activity   Alcohol  use: No   Drug use: No   Sexual activity: Yes    Birth control/protection: Implant  Other Topics Concern   Not on file  Social History Narrative   Not on file   Social Drivers of Health   Financial Resource Strain: Low Risk  (07/31/2024)   Overall Financial Resource Strain (CARDIA)    Difficulty of Paying Living Expenses: Not hard at all  Food Insecurity: No Food Insecurity (07/31/2024)   Hunger Vital Sign    Worried About Running Out of Food in the Last Year: Never  true    Ran Out of Food in the Last Year: Never true  Transportation Needs: No Transportation Needs (07/31/2024)   PRAPARE - Administrator, Civil Service (Medical): No    Lack of Transportation (Non-Medical): No  Physical Activity: Insufficiently Active (07/31/2024)   Exercise Vital Sign    Days of Exercise per Week: 1 day    Minutes of Exercise per Session: 20 min  Stress: No Stress Concern Present (07/31/2024)   [employer] of Occupational Health - Occupational Stress Questionnaire    Feeling of Stress: Only a little  Social Connections: Moderately Isolated (07/31/2024)   Social Connection and Isolation Panel    Frequency of Communication with Friends and Family: Once a week    Frequency of Social Gatherings with Friends and Family: Once a week    Attends Religious Services: More than 4 times per year    Active Member of [employer] or Organizations: No    Attends Banker Meetings: Not on file    Marital Status: Married  Catering Manager Violence: Not At Risk (12/15/2023)   Humiliation, Afraid, Rape, and Kick questionnaire    Fear of Current or Ex-Partner: No    Emotionally Abused: No    Physically Abused: No    Sexually Abused: No   Family History  Problem Relation Age of Onset   Diabetes Mother    Diabetes Father    No Known Allergies   PE Today's Vitals   08/16/24 1115  BP: 124/80  Pulse: 72  Temp: 97.8 F (36.6 C)  TempSrc: Oral  SpO2: 98%  Weight: 133 lb (60.3 kg)  Height: 5' 2.5 (1.588 m)   Body mass index is 23.94 kg/m.  Physical Exam Vitals reviewed. Exam conducted with a chaperone present.  Constitutional:      General: She is not in acute distress.    Appearance: Normal appearance.  HENT:     Head: Normocephalic and atraumatic.     Nose: Nose normal.  Eyes:     Extraocular Movements: Extraocular movements intact.     Conjunctiva/sclera: Conjunctivae normal.  Pulmonary:     Effort: Pulmonary effort is normal.  Chest:      Chest wall: No mass or tenderness.  Breasts:    Right: Normal. No swelling, mass, nipple discharge, skin change or tenderness.     Left: Normal. No swelling, mass, nipple discharge, skin change or tenderness.  Abdominal:     General: There is no distension.     Palpations: Abdomen is soft.     Tenderness: There is no abdominal tenderness.  Genitourinary:    General: Normal vulva.     Exam position: Lithotomy position.     Urethra: No prolapse.  Vagina: Normal. No vaginal discharge or bleeding.     Cervix: Normal. No lesion.     Uterus: Normal. Not enlarged and not tender.      Adnexa: Right adnexa normal and left adnexa normal.  Musculoskeletal:        General: Normal range of motion.     Cervical back: Normal range of motion.  Lymphadenopathy:     Upper Body:     Right upper body: No axillary adenopathy.     Left upper body: No axillary adenopathy.     Lower Body: No right inguinal adenopathy. No left inguinal adenopathy.  Skin:    General: Skin is warm and dry.     Comments: Nexplanon  palpated in left arm  Neurological:     General: No focal deficit present.     Mental Status: She is alert.  Psychiatric:        Mood and Affect: Mood normal.        Behavior: Behavior normal.       Assessment and Plan:        Well woman exam with routine gynecological exam Assessment & Plan: Cervical cancer screening performed according to ASCCP guidelines. Encouraged annual mammogram screening Colonoscopy N/A  Labs and immunizations with her primary Encouraged safe sexual practices as indicated Encouraged healthy lifestyle practices with diet and exercise For patients under 50yo, I recommend 1000mg  calcium daily and 600IU of vitamin D  daily.    Cervical cancer screening -     Cytology - PAP  History of cervical dysplasia -     Cytology - PAP  Negative depression screening  Encounter for surveillance of Nexplanon  subdermal contraceptive -     Insertion of implanon  rod;  Future  RTO for removal an reinsertion  Vera LULLA Pa, MD

## 2024-08-16 NOTE — Patient Instructions (Signed)

## 2024-08-16 NOTE — Assessment & Plan Note (Signed)
 Cervical cancer screening performed according to ASCCP guidelines. Encouraged annual mammogram screening Colonoscopy N/A  Labs and immunizations with her primary Encouraged safe sexual practices as indicated Encouraged healthy lifestyle practices with diet and exercise For patients under 42yo, I recommend 1000mg  calcium daily and 600IU of vitamin D  daily.

## 2024-08-18 ENCOUNTER — Ambulatory Visit: Payer: Self-pay | Admitting: Obstetrics and Gynecology

## 2024-08-18 LAB — CYTOLOGY - PAP
Comment: NEGATIVE
Diagnosis: NEGATIVE
Diagnosis: REACTIVE
High risk HPV: NEGATIVE

## 2024-09-13 ENCOUNTER — Encounter: Payer: Self-pay | Admitting: Obstetrics and Gynecology

## 2024-09-13 ENCOUNTER — Ambulatory Visit (INDEPENDENT_AMBULATORY_CARE_PROVIDER_SITE_OTHER): Admitting: Obstetrics and Gynecology

## 2024-09-13 VITALS — BP 132/80 | HR 78 | Temp 97.9°F | Wt 132.0 lb

## 2024-09-13 DIAGNOSIS — Z3046 Encounter for surveillance of implantable subdermal contraceptive: Secondary | ICD-10-CM

## 2024-09-13 DIAGNOSIS — Z01812 Encounter for preprocedural laboratory examination: Secondary | ICD-10-CM

## 2024-09-13 LAB — PREGNANCY, URINE: Preg Test, Ur: NEGATIVE

## 2024-09-13 MED ORDER — LIDOCAINE-EPINEPHRINE 1 %-1:100000 IJ SOLN: 6.0000 mL | Freq: Once | INTRAMUSCULAR | Status: AC

## 2024-09-13 MED ORDER — ETONOGESTREL 68 MG ~~LOC~~ IMPL
68.0000 mg | DRUG_IMPLANT | Freq: Once | SUBCUTANEOUS | Status: AC
  Administered 2024-09-13: 68 mg via SUBCUTANEOUS

## 2024-09-13 MED ORDER — LIDOCAINE-EPINEPHRINE 1 %-1:100000 IJ SOLN: 10.0000 mL | Freq: Once | INTRAMUSCULAR | Status: DC

## 2024-09-13 NOTE — Progress Notes (Signed)
 "  43 y.o. G30P3003 female here for Nexplanon  removal and reinsertion. Married.  Patient's last menstrual period was 09/12/2024 (approximate).   No complaints.  OB History  Gravida Para Term Preterm AB Living  3 3 3  0 0 3  SAB IAB Ectopic Multiple Live Births  0 0 0 0 3    # Outcome Date GA Lbr Len/2nd Weight Sex Type Anes PTL Lv  3 Term 05/22/16 [redacted]w[redacted]d / 00:06 8 lb 0.2 oz (3.635 kg) M Vag-Spont None  LIV  2 Term 06/04/13 [redacted]w[redacted]d 08:00 / 00:11 7 lb 2.8 oz (3.255 kg) M Vag-Spont None  LIV  1 Term 10/07/10 [redacted]w[redacted]d  7 lb (3.175 kg) F Vag-Spont EPI  LIV    Past Medical History:  Diagnosis Date   AS (sickle cell trait) 12/22/2012   Hypertension     History reviewed. No pertinent surgical history.  Medications Ordered Prior to Encounter[1]  Allergies[2]    PE Today's Vitals   09/13/24 1357  BP: 132/80  Pulse: 78  Temp: 97.9 F (36.6 C)  TempSrc: Oral  SpO2: 99%  Weight: 132 lb (59.9 kg)   Body mass index is 23.76 kg/m.  Physical Exam Vitals reviewed.  Constitutional:      General: She is not in acute distress.    Appearance: Normal appearance.  HENT:     Head: Normocephalic and atraumatic.     Nose: Nose normal.  Eyes:     Extraocular Movements: Extraocular movements intact.     Conjunctiva/sclera: Conjunctivae normal.  Pulmonary:     Effort: Pulmonary effort is normal.  Musculoskeletal:        General: Normal range of motion.     Cervical back: Normal range of motion.  Neurological:     General: No focal deficit present.     Mental Status: She is alert.  Psychiatric:        Mood and Affect: Mood normal.        Behavior: Behavior normal.     Nexplanon  removal Nexplanon  insertion: Consent was signed. Timeout was performed. Patient placed supine on exam table with her left arm flexed at the elbow. The prior insertion site was located and the Nexplanon  rod was palpated.  Area cleansed with Hibiclens x 3 and draped in normal sterile fashion.  Insertion site and  surrounding tissue anesthetized with 1% Lidocaine  (Lot # [id number], Exp 2027/02) w/ epinephrine, 3 cc total used.  Small incision made with #11 blade. Patient was noted to be uncomfortable. An additional 3cc of lidocaine  was injected. Nexplanon  was removed without difficulty.  Attention was then turned to insertion of a new device, Nexplanon (Lot # [id number], Exp 2027/08).  After confirming presence of rod in device, skin was pierced and then elevated along insertion path, passing device just under the skin.  Rod released and device inserted.  Rod palpated easily by myself and patient.  Steri-strips applied and a pressure bandage was placed over site.  Entire procedure performed with sterile technique.     Assessment and Plan:        Encounter for removal and reinsertion of Nexplanon  -     Etonogestrel  -     Lidocaine -EPINEPHrine  Pre-procedure lab exam -     Pregnancy, urine    Uncomplicated insertion, RTO for annual next year  Vera LULLA Pa, MD     [1]  Current Outpatient Medications on File Prior to Visit  Medication Sig Dispense Refill   amLODipine  (NORVASC ) 10 MG tablet Take 1 tablet (10  mg total) by mouth daily. Ok to take half twice daily due to headaches with whole tablet. 90 tablet 1   Blood Pressure Monitoring (BLOOD PRESSURE KIT) DEVI Use to measure blood pressure 1 each 0   cyclobenzaprine  (FLEXERIL ) 5 MG tablet Take 1 tablet (5 mg total) by mouth at bedtime. Prn muscle spasms 30 tablet 1   etonogestrel  (NEXPLANON ) 68 MG IMPL implant 1 each by Subdermal route once.     No current facility-administered medications on file prior to visit.  [2] No Known Allergies  "

## 2024-11-01 ENCOUNTER — Ambulatory Visit: Admitting: Nurse Practitioner

## 2025-08-20 ENCOUNTER — Ambulatory Visit: Admitting: Obstetrics and Gynecology

## 2025-09-03 ENCOUNTER — Encounter: Admitting: Obstetrics and Gynecology
